# Patient Record
Sex: Male | Born: 1945 | Race: White | Hispanic: No | Marital: Married | Smoking: Never smoker
Health system: Southern US, Community
[De-identification: ages and names within clinical notes are randomized; demographics above are authoritative.]

## PROBLEM LIST (undated history)

## (undated) DIAGNOSIS — I219 Acute myocardial infarction, unspecified: Secondary | ICD-10-CM

## (undated) DIAGNOSIS — E782 Mixed hyperlipidemia: Secondary | ICD-10-CM

## (undated) DIAGNOSIS — R05 Cough: Secondary | ICD-10-CM

## (undated) DIAGNOSIS — R040 Epistaxis: Secondary | ICD-10-CM

## (undated) DIAGNOSIS — R079 Chest pain, unspecified: Secondary | ICD-10-CM

## (undated) DIAGNOSIS — I712 Thoracic aortic aneurysm, without rupture: Secondary | ICD-10-CM

## (undated) DIAGNOSIS — I10 Essential (primary) hypertension: Secondary | ICD-10-CM

## (undated) DIAGNOSIS — Z952 Presence of prosthetic heart valve: Secondary | ICD-10-CM

## (undated) DIAGNOSIS — R609 Edema, unspecified: Secondary | ICD-10-CM

## (undated) DIAGNOSIS — Z8679 Personal history of other diseases of the circulatory system: Secondary | ICD-10-CM

## (undated) DIAGNOSIS — G902 Horner's syndrome: Secondary | ICD-10-CM

## (undated) DIAGNOSIS — Z9889 Other specified postprocedural states: Secondary | ICD-10-CM

## (undated) DIAGNOSIS — I2510 Atherosclerotic heart disease of native coronary artery without angina pectoris: Secondary | ICD-10-CM

## (undated) DIAGNOSIS — I739 Peripheral vascular disease, unspecified: Secondary | ICD-10-CM

## (undated) DIAGNOSIS — I7101 Dissection of thoracic aorta: Secondary | ICD-10-CM

## (undated) DIAGNOSIS — I714 Abdominal aortic aneurysm, without rupture, unspecified: Secondary | ICD-10-CM

## (undated) DIAGNOSIS — G459 Transient cerebral ischemic attack, unspecified: Secondary | ICD-10-CM

## (undated) DIAGNOSIS — I639 Cerebral infarction, unspecified: Secondary | ICD-10-CM

## (undated) DIAGNOSIS — H9190 Unspecified hearing loss, unspecified ear: Secondary | ICD-10-CM

## (undated) DIAGNOSIS — I499 Cardiac arrhythmia, unspecified: Secondary | ICD-10-CM

## (undated) HISTORY — DX: Presence of prosthetic heart valve: Z95.2

## (undated) HISTORY — DX: Essential (primary) hypertension: I10

## (undated) HISTORY — DX: Chest pain, unspecified: R07.9

## (undated) HISTORY — DX: Abdominal aortic aneurysm, without rupture, unspecified: I71.40

## (undated) HISTORY — DX: Dissection of thoracic aorta: I71.01

## (undated) HISTORY — DX: Other specified postprocedural states: Z98.890

## (undated) HISTORY — DX: Transient cerebral ischemic attack, unspecified: G45.9

## (undated) HISTORY — DX: Horner's syndrome: G90.2

## (undated) HISTORY — PX: VASCULAR SURGERY: SHX849

## (undated) HISTORY — DX: Epistaxis: R04.0

## (undated) HISTORY — DX: Personal history of other diseases of the circulatory system: Z86.79

## (undated) HISTORY — DX: Abdominal aortic aneurysm, without rupture: I71.4

## (undated) HISTORY — PX: CARDIAC CATHETERIZATION: SHX172

## (undated) HISTORY — DX: Peripheral vascular disease, unspecified: I73.9

## (undated) HISTORY — DX: Thoracic aortic aneurysm, without rupture: I71.2

## (undated) HISTORY — DX: Mixed hyperlipidemia: E78.2

---

## 1997-03-18 DIAGNOSIS — I639 Cerebral infarction, unspecified: Secondary | ICD-10-CM

## 1997-03-18 DIAGNOSIS — I2510 Atherosclerotic heart disease of native coronary artery without angina pectoris: Secondary | ICD-10-CM | POA: Insufficient documentation

## 1997-03-18 HISTORY — DX: Cerebral infarction, unspecified: I63.9

## 1999-03-19 HISTORY — PX: CARDIAC VALVE REPLACEMENT: SHX585

## 2002-02-08 ENCOUNTER — Encounter: Payer: Self-pay | Admitting: *Deleted

## 2002-02-08 ENCOUNTER — Encounter: Payer: Self-pay | Admitting: Cardiology

## 2002-02-08 ENCOUNTER — Inpatient Hospital Stay (HOSPITAL_COMMUNITY): Admission: EM | Admit: 2002-02-08 | Discharge: 2002-02-17 | Payer: Self-pay | Admitting: Emergency Medicine

## 2002-02-09 ENCOUNTER — Encounter: Payer: Self-pay | Admitting: Thoracic Surgery (Cardiothoracic Vascular Surgery)

## 2002-02-09 ENCOUNTER — Encounter: Payer: Self-pay | Admitting: Internal Medicine

## 2002-02-09 DIAGNOSIS — I7101 Dissection of ascending aorta: Secondary | ICD-10-CM

## 2002-02-09 DIAGNOSIS — Z8679 Personal history of other diseases of the circulatory system: Secondary | ICD-10-CM

## 2002-02-09 HISTORY — DX: Dissection of thoracic aorta: I71.01

## 2002-02-09 HISTORY — DX: Dissection of ascending aorta: I71.010

## 2002-02-09 HISTORY — DX: Personal history of other diseases of the circulatory system: Z86.79

## 2002-02-10 ENCOUNTER — Encounter: Payer: Self-pay | Admitting: Thoracic Surgery (Cardiothoracic Vascular Surgery)

## 2002-02-10 ENCOUNTER — Encounter (INDEPENDENT_AMBULATORY_CARE_PROVIDER_SITE_OTHER): Payer: Self-pay | Admitting: Specialist

## 2002-02-11 ENCOUNTER — Encounter: Payer: Self-pay | Admitting: Thoracic Surgery (Cardiothoracic Vascular Surgery)

## 2002-02-12 ENCOUNTER — Encounter: Payer: Self-pay | Admitting: Thoracic Surgery (Cardiothoracic Vascular Surgery)

## 2002-02-13 ENCOUNTER — Encounter: Payer: Self-pay | Admitting: Cardiothoracic Surgery

## 2002-02-14 ENCOUNTER — Encounter: Payer: Self-pay | Admitting: Cardiothoracic Surgery

## 2002-03-01 ENCOUNTER — Encounter: Payer: Self-pay | Admitting: Thoracic Surgery (Cardiothoracic Vascular Surgery)

## 2002-03-01 ENCOUNTER — Encounter
Admission: RE | Admit: 2002-03-01 | Discharge: 2002-03-01 | Payer: Self-pay | Admitting: Thoracic Surgery (Cardiothoracic Vascular Surgery)

## 2002-03-02 ENCOUNTER — Encounter
Admission: RE | Admit: 2002-03-02 | Discharge: 2002-03-02 | Payer: Self-pay | Admitting: Thoracic Surgery (Cardiothoracic Vascular Surgery)

## 2002-03-02 ENCOUNTER — Encounter: Payer: Self-pay | Admitting: Thoracic Surgery (Cardiothoracic Vascular Surgery)

## 2002-03-18 DIAGNOSIS — I219 Acute myocardial infarction, unspecified: Secondary | ICD-10-CM

## 2002-03-18 HISTORY — DX: Acute myocardial infarction, unspecified: I21.9

## 2002-09-27 ENCOUNTER — Encounter: Payer: Self-pay | Admitting: Thoracic Surgery (Cardiothoracic Vascular Surgery)

## 2002-09-27 ENCOUNTER — Encounter
Admission: RE | Admit: 2002-09-27 | Discharge: 2002-09-27 | Payer: Self-pay | Admitting: Thoracic Surgery (Cardiothoracic Vascular Surgery)

## 2003-03-19 DIAGNOSIS — G4700 Insomnia, unspecified: Secondary | ICD-10-CM | POA: Insufficient documentation

## 2005-07-31 ENCOUNTER — Other Ambulatory Visit: Payer: Self-pay

## 2005-07-31 ENCOUNTER — Inpatient Hospital Stay: Payer: Self-pay | Admitting: Internal Medicine

## 2006-04-25 ENCOUNTER — Inpatient Hospital Stay (HOSPITAL_COMMUNITY): Admission: EM | Admit: 2006-04-25 | Discharge: 2006-04-30 | Payer: Self-pay | Admitting: Emergency Medicine

## 2006-04-25 ENCOUNTER — Ambulatory Visit: Payer: Self-pay | Admitting: Cardiology

## 2006-04-25 ENCOUNTER — Ambulatory Visit: Payer: Self-pay | Admitting: Cardiothoracic Surgery

## 2006-04-25 ENCOUNTER — Emergency Department: Payer: Self-pay | Admitting: Emergency Medicine

## 2006-04-25 DIAGNOSIS — I7101 Dissection of thoracic aorta: Secondary | ICD-10-CM

## 2006-04-25 DIAGNOSIS — I71012 Dissection of descending thoracic aorta: Secondary | ICD-10-CM

## 2006-04-25 HISTORY — DX: Dissection of descending thoracic aorta: I71.012

## 2006-04-25 HISTORY — DX: Dissection of thoracic aorta: I71.01

## 2006-04-28 ENCOUNTER — Encounter: Payer: Self-pay | Admitting: Cardiology

## 2006-05-08 ENCOUNTER — Ambulatory Visit: Payer: Self-pay | Admitting: Cardiology

## 2006-05-12 ENCOUNTER — Ambulatory Visit: Payer: Self-pay | Admitting: Thoracic Surgery (Cardiothoracic Vascular Surgery)

## 2006-05-12 ENCOUNTER — Encounter
Admission: RE | Admit: 2006-05-12 | Discharge: 2006-05-12 | Payer: Self-pay | Admitting: Thoracic Surgery (Cardiothoracic Vascular Surgery)

## 2006-06-05 ENCOUNTER — Ambulatory Visit: Payer: Self-pay | Admitting: Cardiology

## 2006-06-10 ENCOUNTER — Ambulatory Visit: Payer: Self-pay

## 2006-06-10 ENCOUNTER — Encounter: Payer: Self-pay | Admitting: Cardiology

## 2006-07-03 ENCOUNTER — Ambulatory Visit: Payer: Self-pay | Admitting: Cardiology

## 2007-01-17 ENCOUNTER — Emergency Department: Payer: Self-pay

## 2008-06-20 DIAGNOSIS — Z9889 Other specified postprocedural states: Secondary | ICD-10-CM | POA: Insufficient documentation

## 2008-07-31 ENCOUNTER — Ambulatory Visit: Payer: Self-pay | Admitting: Cardiovascular Disease

## 2008-07-31 ENCOUNTER — Inpatient Hospital Stay: Payer: Self-pay | Admitting: Internal Medicine

## 2008-08-11 DIAGNOSIS — I10 Essential (primary) hypertension: Secondary | ICD-10-CM | POA: Insufficient documentation

## 2008-08-11 DIAGNOSIS — E785 Hyperlipidemia, unspecified: Secondary | ICD-10-CM | POA: Insufficient documentation

## 2008-08-11 DIAGNOSIS — R079 Chest pain, unspecified: Secondary | ICD-10-CM | POA: Insufficient documentation

## 2008-08-18 ENCOUNTER — Ambulatory Visit: Payer: Self-pay | Admitting: Cardiology

## 2008-09-01 ENCOUNTER — Ambulatory Visit: Payer: Self-pay | Admitting: Cardiology

## 2008-10-04 LAB — CONVERTED CEMR LAB
CO2: 26 meq/L (ref 19–32)
Calcium: 9.8 mg/dL (ref 8.4–10.5)
Creatinine, Ser: 1.15 mg/dL (ref 0.40–1.50)
Glucose, Bld: 73 mg/dL (ref 70–99)
Sodium: 139 meq/L (ref 135–145)

## 2008-12-06 ENCOUNTER — Ambulatory Visit: Payer: Self-pay | Admitting: Cardiology

## 2010-04-08 ENCOUNTER — Encounter: Payer: Self-pay | Admitting: Thoracic Surgery (Cardiothoracic Vascular Surgery)

## 2010-07-31 NOTE — Assessment & Plan Note (Signed)
[facility] OFFICE NOTE   Jason Mcdaniel, Jason Mcdaniel                    MRN:          [id number]  DATE:12/06/2008                            DOB:          1945-04-21    Mr. Wieting comes in today for followup.   PROBLEM LIST:  1. History of aortic valve replacement at [facility] in 2000.  2. History of an ascending aortic root replacement in 2003.  3. History of type 3 aortic dissection, descending thoracic aorta,      treated medically.  4. Labile hypertension.  5. Hyperlipidemia.  6. 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   He has been doing remarkably well.  He works out almost on a daily  basis.  He is having no symptoms of coronary insufficiency or aortic  valve disease.  His blood pressure has been under great control since we  increased his Diovan/HCTZ to 320/25 daily.  Please see EMR for his other  medications.   His blood work is being followed by Dr. Sherrie Mustache.   PHYSICAL EXAMINATION:  VITAL SIGNS:  His blood pressure is 138/78 in the  left arm.  His pulse is 62 and regular, height 71 inches, and weight is  213.5 pounds.  BMI is 29.8.  HEENT:  Normal.  NECK:  Carotid upstrokes are equal bilaterally without bruits.  Thyroid  is not enlarged.  Trachea is midline.  LUNGS:  Clear to auscultation and percussion.  HEART:  Regular rate and rhythm, nondisplaced PMI.  There is no aortic  insufficiency heard.  ABDOMEN:  Soft, good bowel sounds.  No tenderness.  EXTREMITIES:  There are no cyanosis, clubbing, or edema.  Pulses are  brisk.   ASSESSMENT AND PLAN:  Mr. Beougher is doing well.  His aortic valve was  stable as well as his aortic root by 2-D echocardiography in June 2010.   I have made no changes in medical program.  We will see him back in a  year.     Thomas C. Daleen Squibb, MD, [facility]  Electronically Signed    TCW/MedQ  DD: 12/06/2008  DT: 12/07/2008  Job #:  [id number]   cc:   Mila Merry, MD

## 2010-07-31 NOTE — Assessment & Plan Note (Signed)
[facility] OFFICE NOTE   Jason Mcdaniel, Jason Mcdaniel                    MRN:          [id number]  DATE:08/18/2008                            DOB:          1945-03-27    Jason Mcdaniel comes in today for followup.   He has recently been having some problems with his blood pressure.  He  was actually admitted overnight at [facility].  A 2-D  echocardiogram was done, was read by Dr. Earney Hamburg of our group.  It turns out that he has a stable aortic valve and normal left  ventricular chamber size and overall function.  He also had normal wall  thickness which is interesting in the setting of a history of severe  hypertension.   His blood pressure has been running about 140-140 systolic over about 80  diastolic at home.  He has a history of aortic root replacement and  aortic valve surgery as outlined in the chart and then subsequent type 3  aortic dissection treated medically.   CURRENT MEDICATIONS:  1. Coumadin 10 mg per day.  2. Diovan 160/25 daily.  3. Catapres 0.2 mg b.i.d.  4. Pravastatin 80 mg daily.  5. Metoprolol 50 mg p.o. b.i.d.  6. Vitamin B12.  7. Coral calcium.   PHYSICAL EXAMINATION:  VITAL SIGNS:  Today, his blood pressure of 190/90  in the left arm.  His pulse is 53 and regular.  His EKG shows sinus  brady with no changes.  Weight is 213, down 6.  HEENT:  Normal.  NECK:  Carotid upstrokes are equal bilaterally without significant  bruit.  Thyroid is enlarged.  Trachea is midline.  LUNGS:  Clear to auscultation and percussion.  HEART:  A normal S1, mechanical S2.  No diastolic murmur.  No gallop.  ABDOMEN:  Soft, good bowel sounds.  There is no midline bruit or  tenderness.  There is no flank bruit.  EXTREMITIES:  No cyanosis, clubbing, or edema.  Pulses are present.  NEURO:  Intact.   ASSESSMENT:  Increasing blood pressure despite a fairly aggressive  medical program.   PLAN:  1.  Increase Diovan to 320/25 daily.  2. Continue other medications.  3. Blood pressure followup with [place] in the office in about a week.  He      will also monitor at      home.  Goal will be less than 140 over less than 85.  We will also      check a BMET at that time when he returns for a blood pressure      check.      Jason C. Daleen Squibb, MD, [facility]  Electronically Signed    TCW/MedQ  DD: 08/18/2008  DT: 08/19/2008  Job #: [id number]   cc:   Jason Mcdaniel

## 2010-08-03 NOTE — Assessment & Plan Note (Signed)
[facility] OFFICE NOTE   Jason, Mcdaniel                    MRN:          [id number]  DATE:06/05/2006                            DOB:          10-31-1945    Jason Mcdaniel comes in today because of some severe upper left-sided chest  tightness and back tightness this past Saturday morning.  He woke about  3 a.m.  He checked his blood pressures 201/102.  He took Catapres and it  came down quickly.  He has not had any recurrent symptoms, this includes  not having any exertional symptoms including working in the yard this  week.   His blood pressures more or less have been pretty good across the board.   This pain was not similar to his aortic dissection.   He had normal coronary arteries at catheterization in 2000 at [facility].  This was the time of his aortic valve replacement.   His blood pressure today is 142/70, pulse 68 and regular, weight 219, he  is in no acute distress.  Carotids are full and equal.  LUNGS:  Clear, there was no midscapular bruit.  HEART:  Reveals a nondisplaced PMI, has normal S1, has a prosthetic S2.  There is no diastolic component.  There is no rub.  ABDOMINAL:  Soft, good bowel sounds, extremities reveal good pulses in  his feet.   ASSESSMENT/PLAN:  Even though Jason Mcdaniel had normal coronary arteries  in 2000, it has been 8 years plus.  In addition he has aortic disease in  2 areas.  He could have some coronary disease as well.  I have asked him  not to exert himself too much over the next couple of days, over the  Easter holidays.  I have asked him to report to [facility] Monday or  Tuesday of next week for an adenosine Myoview.  I do not feel  comfortable exercising him at this point with his recent dissection.   If this is negative for ischemia I will see him back as scheduled.     Thomas C. Daleen Squibb, MD, [facility]  Electronically Signed    TCW/MedQ  DD: 06/05/2006  DT:  06/05/2006  Job #: [id number]   cc:   Jason Mcdaniel

## 2010-08-03 NOTE — Consult Note (Signed)
NAME:  Jason Mcdaniel, LAMPERT NO.:  [id number]   MEDICAL RECORD NO.:  [id number]          PATIENT TYPE:  INP   LOCATION:  2033                         FACILITY:  [facility]   PHYSICIAN:  Learta Codding, MD,FACC DATE OF BIRTH:  1945-10-13   DATE OF CONSULTATION:  04/27/2006  DATE OF DISCHARGE:                                 CONSULTATION   REFERRING PHYSICIAN:  Sheliah Plane, MD   REASON FOR CONSULTATION:  Re-establish followup for patient with type  III aortic dissection with cardiology.   HISTORY OF PRESENT ILLNESS:  The patient is a 65 year old male with a  history of severe vascular disease.  The patient has a history of aortic  valve replacement dating back to 2000 which was performed at [facility].  This was performed through right  thoracotomy.  The patient at that time had a heart catheterization done  with records available at [facility].  Reportedly, there was no significant  coronary artery disease, and no bypass grafting was performed.  Several  years later in 2003, the patient presented with acute ascending aortic  dissection, type 2, with replacement of the ascending aortic arch.  This  procedure was performed at [facility] by Dr. Cornelius Moras.   The patient states that, over the last 18 months, he did see Dr. Ladona Ridgel  in our practice for exertional syncope.  Unfortunately, I have not been  able to find any notes in the database, neither in the electronic chart  nor in the [place] [facility] folder or any current notes available.  The patient  has had no recent recurrent syncope.   The patient has now been admitted to [facility] after he  started developing severe abdominal pain as well as epigastric pain.  He  apparently got into an argument at the local [employer] in [place].  Of note is that the patient is [employer].  He  stated that he had severe epigastric pain with some radiation into the  chest, particularly  with radiation to the back along with nausea and  feeling extremely week and poorly.  The patient presented to [facility] and was found to have a type III aortic dissection.  The  patient was then transferred to [facility] under the care of  Dr. Tyrone Sage.  It is reported that the type III aortic dissection starts  just past the left subclavian artery and extending down in the abdominal  aorta.  Both kidneys are easily visualized with contrast.  There were no  electrocardiographic changes seen on the patient's EKG on admission.  The patient also ruled out with cardiac enzymes, but troponin, CK, and  CK-MB were negative.  His BNP level also was low at 70.   The patient also reports, over the last several months, no exertional  angina or exertional chest pain.  The current episode was rather an  acute one.  He states that he never had another catheterization  performed since the original procedure in 2000.  He also had no recent  stress testing done.  In addition, the  patient did report approximately  6 months ago two episodes, weeks apart, that involved right eye  deviation.  The patient was told that he potentially could have had a  small stroke.  This was associated with a decrease in his  anticoagulation level at the time.  MRI was performed as well as carotid  Doppler which, per patient's report, were within normal limits.   The patient is currently stable from a hemodynamic perspective.  Blood  pressure is controlled as per Dr. Tyrone Sage with the addition of  clonidine to his regimen of a beta blocker and Diovan.  The patient  reports no recurrent epigastric or substernal chest pain.   PAST MEDICAL HISTORY:  See Problem List below.   MEDICATIONS:  1. Coumadin as directed, 7.5.  2. Toprol, doses unknown.  3. Diovan, dose unknown.  4. Has previously taken Pravachol and Lipitor with associated muscle      aches.   SOCIAL HISTORY:  The patient lives in [place]  with his wife.  He is a  Advice worker.  He does not smoke, drinks occasionally.   FAMILY HISTORY:  The patient's mother died at age 31.  Father died at  age 7 of myocardial infarction.  Six brothers; one died at age 57 of  unknown causes.   REVIEW OF SYSTEMS:  As per HPI.  Currently no nausea or vomiting, no  fever or chills.  No melena, hematochezia.  No dysuria or frequency.  The patient reports leg cramps but no definite claudication.  No  syncope.  Remainder of Review of Systems negative.   PHYSICAL EXAMINATION:  VITAL SIGNS: Blood pressure 130/60, heart rate 70  beats per minute.  GENERAL: Well-nourished white male in no acute distress.  HEENT:  Pupils isochoric.  Conjunctivae clear.  NECK:  Supple.  Normal carotid upstroke and soft bilateral carotid  bruits probably radiating from the aortic valve.  No thyromegaly, no  JVD.  LUNGS: Clear breath sounds bilaterally.  HEART: Regular rate and rhythm with normal S1 and crisp closing click of  S2.  There is a soft murmur at the left upper sternal border.  ABDOMEN: Soft and nontender.  No rebound or guarding.  Good bowel  sounds.  EXTREMITIES:  Exam reveals no cyanosis, clubbing, or edema.  NEUROLOGIC: The patient is alert and oriented, grossly nonfocal.   LABORATORY WORK:  Troponin negative as outlined above.  Creatinine 0.88.  INR 3.3.  Hemoglobin 12.1. Sodium 139, potassium 4.1, chloride 104, CO2  28, glucose 120, calcium 8.8.   A 12-lead electrocardiogram revealed normal sinus rhythm, no acute  ischemic changes.   Chest x-ray performed at [facility]: No acute abnormalities.   CT scan as above.   PROBLEM LIST:  1. Type III dissection (acute).  2. Hypertension.  3. History of aortic valve replacement at [facility] (through a right thoracotomy).      a.     Status post acute descending aortic dissection, type II.     b.     Status post replacement of ascending arch, Dr. Cornelius Moras, 2003.  4.  History of syncope (evaluated by Dr. Ladona Ridgel).  5. Questionable history of transient ischemic attack x2, last one 6      months ago (status post negative neurologic evaluation).  6. Dyslipidemia.  7. Coumadin anticoagulation secondary to #3.   PLAN:  1. Acute care management per Dr. Tyrone Sage. Continue aggressive blood      pressure control.  Clonidine,  Diovan, and beta blockers seems like      a reasonable regimen.  2. Continue anticoagulation secondary to aortic valve replacement but      maintain INR between 2 and 3.  3. Dyslipidemia.  Will add Pravachol 40 mg p.o. nightly to patient's      medical regimen.  4. Particularly in light of the patient's aortic dissection, aspirin      can be discontinued.  The patient has no known history of coronary      artery disease, and Coumadin is acceptable therapy for prophylaxis.  5. The patient will need further evaluation with adenosine Cardiolite      study in the next couple of weeks, but this can be done as an      outpatient.  6. The patient will follow up in our new office in [place], and we      will make those arrangements.      Learta Codding, MD,FACC  Electronically Signed     GED/MEDQ  D:  04/27/2006  T:  04/27/2006  Job:  [id number]   cc:   Sheliah Plane, MD

## 2010-08-03 NOTE — Discharge Summary (Signed)
NAME:  Jason Mcdaniel, Jason Mcdaniel NO.:  [id number]   MEDICAL RECORD NO.:  [id number]                   PATIENT TYPE:  INP   LOCATION:  2006                                 FACILITY:  [facility]   PHYSICIAN:  Salvatore Decent. Cornelius Moras, M.D.              DATE OF BIRTH:  04/11/1945   DATE OF ADMISSION:  02/08/2002  DATE OF DISCHARGE:  02/17/2002                                 DISCHARGE SUMMARY   ADMISSION DIAGNOSIS:  Chest pain.   SECONDARY DIAGNOSIS:  1. Postoperative anemia secondary to blood loss.  2. Hypertension.  3. Coronary artery disease.   DISCHARGE DIAGNOSIS:  Status post aortic dissection repair.   HOSPITAL COURSE:  The patient was admitted to [facility] on  February 08, 2002, after he had been experiencing chest pain.  Because of  this, he was seen and evaluated by cardiologists.  The patient subsequent  had chest CAT scan performed which revealed the patient had an acute type 2  aortic dissection.  Because of this, Salvatore Decent. Cornelius Moras, M.D. was emergently  consulted and performed a redo median sternotomy with repair of acute aortic  dissection.  No complications were noted during this procedure.  Postoperatively, the patient had postoperative anemia secondary to blood  loss.  He required transfusion of one unit of packed red blood cells.  His  hematocrit was subsequently stable at 23.  Because his hematocrit remained  low, he was transfused one more unit of packed red blood cells and his  hemoglobin had risen sufficiently to 8.7 by postoperative day #4.  Because  the patient had a prior aortic valve repair and was on Coumadin therapy, his  Coumadin was restarted.  The remainder of the patient's postoperative course  remained uneventful and he was subsequently deemed stable for discharge home  on postoperative day #7, February 17, 2002.   DISCHARGE MEDICATIONS:  1. Protonix 40 mg one daily.  2. Altace 10 mg one tablet twice daily.  3. Atenolol 25 mg one  tablet twice daily.  4. Pravachol 80 mg one daily.  5. Coumadin 5 mg one daily as directed by Elliot Cousin, M.D.  6. Ultram 50 mg one tablet every four to six hours as needed for pain.   ACTIVITY:  The patient was told no driving or strenuous activity.  He was  told to walk daily and continue the use of his incentive spirometer  approximately 10 times every hour.   DIET:  Low fat, low salt.   WOUND CARE:  The patient was told he could shower and clean his incision  with soap and water.   DISPOSITION:  Home.   FOLLOW UP:  The patient was told to call his cardiologist for a two-week  follow-up appointment.  He was told to have his INR checked by Dr. Sherrie Mustache in  two to three days.  He was told to see Dr. Cornelius Moras at the [facility] office  on  Monday, December 15, at 1:30 p.m.     Levin Erp. Steward, P.A.                      Salvatore Decent. Cornelius Moras, M.D.    Henrene Hawking  D:  02/16/2002  T:  02/17/2002  Job:  [id number]

## 2010-08-03 NOTE — Consult Note (Signed)
NAME:  Jason Mcdaniel, Jason Mcdaniel NO.:  [id number]   MEDICAL RECORD NO.:  [id number]          PATIENT TYPE:  INP   LOCATION:  1845                         FACILITY:  [facility]   PHYSICIAN:  Sheliah Plane, MD    DATE OF BIRTH:  02-27-1946   DATE OF CONSULTATION:  DATE OF DISCHARGE:                                 CONSULTATION   CHIEF COMPLAINT:  The patient transferred from [facility] because  of chest pain.   HISTORY OF PRESENT ILLNESS:  The patient is a 65 year old male with an  extensive cardiac history, who after getting into a verbal argument with  a person at the local [facility] where he had been working out, he  developed severe substernal chest pain radiating to the back along with  nausea and feeling poorly.  He was taken to the [facility], where  a CT scan was performed and the patient was transferred to [facility].  He has a previous history of St. Jude aortic valve  replacement by Dr. Silvestre Mesi at [facility] in 2000, through a right thoracotomy  approach.  Subsequently, less than 3 years later he was found to have  had an acute aortic dissection and an ascending aorta dilated to over 6-  cm.  He was seen by Dr. Cornelius Moras at that time and replacement of the  ascending aorta to the underside of the arch with circulatory arrest was  carried out.  At the time, both reviewing the CT scan and from the  report, the patient did not have evidence of dissection involving the  descending thoracic aorta.  He notes that since the surgery, he has done  well without any other major medical problems and is treated actively  for hypertension and has been taking his Coumadin and having this  followed in [place] on a regular basis.   PAST MEDICAL HISTORY:  1. Status post bicuspid aortic valve, St. Jude aortic valve      replacement in September 2000; followed by ascending aortic      replacement in 2003.  2. History of exertional syncope, evaluated by Dr. Lewayne Bunting at      [facility] in July 2003.  3. History of hypertension.  4. History of hyperlipidemia.  5. History of stroke in 1997, treated at [facility].   MEDICATIONS:  The patient was unsure of the doses of his current  medications but he continues on:  1. Coumadin 7.5 mg a day.  2. Toprol, unknown dose.  3. Diovan, unknown dose.  4. A medicine for cholesterol but does not remember which one.  Notes      that he had previously been on Pravachol and has taken Lipitor      which caused sore muscles.  5. He also takes one baby aspirin a day.   SOCIAL HISTORY:  The patient lives in [place] with his wife.  He does  not use tobacco.  He drinks alcohol occasionally.   FAMILY HISTORY:  The patient's mother died at age 73.  Father died at  age 30 of myocardial infarction.  He has six brothers,  one died at age  12, unknown cause.   REVIEW OF SYSTEMS:  The patient denies any amaurosis or TIAs, denies any  fever, chills, or night sweats, denies any blood in his stool or urine,  other review of systems are negative.   PHYSICAL EXAMINATION:  GENERAL:  The patient is somewhat sedated from  medication given to him in [facility], but he is easily arousable  and answers questions.  The patient currently is comfortable without  complaint of pain.  VITAL SIGNS:  His blood pressure is 140/67, pulse is 65 and regular, O2  sat is 96% on 2 liters.  HEENT:  Pupils are slightly small but reactive.  NECK:  Without carotid bruits.  CARDIAC:  Reveals a crisp mechanical valve sound without murmur of  aortic insufficiency.  ABDOMEN:  Benign without palpable tenderness.  EXTREMITIES:  He has easily palpable femoral, popliteal, DP, and PT  pulses.  Radial pulses are easily palpable bilaterally.   The patient's old CT reports are reviewed.  I was able to locate the CT  scan from February 09, 2002.  At that time he had no dissection flap  involving the descending thoracic aorta.  I reviewed the CT scan  and  chest x-ray from [place] with Dr. Lacy Duverney.  This shows a type 3 aortic  dissection starting just past the left subclavian artery and extending  down into the abdominal aorta.  Both kidneys are easily visualized with  contrast.  EKG from [place] does not show any acute EKG changes.   IMPRESSION:  The patient with a previous history of mechanical aortic  valve replacement, subsequent aortic dissection and replacement of the  ascending aorta, now new type 3 aortic dissection.   PLAN:  1. Admit the patient.  2. Continue aggressive blood pressure control including beta-blockers.  3. Obtain serial EKGs and cardiac enzymes.  4. Continue with medical treatment.  5. We will need to maintain the patient's anticoagulation secondary to      his mechanical valve.      Sheliah Plane, MD  Electronically Signed     EG/MEDQ  D:  04/25/2006  T:  04/25/2006  Job:  [id number]

## 2010-08-03 NOTE — H&P (Signed)
NAME:  Jason Mcdaniel, Jason Mcdaniel NO.:  [id number]   MEDICAL RECORD NO.:  [id number]                   PATIENT TYPE:  EMS   LOCATION:  MAJO                                 FACILITY:  [facility]   PHYSICIAN:  Veneda Mcdaniel, M.D. [facility]               DATE OF BIRTH:  1945/06/07   DATE OF ADMISSION:  02/08/2002  DATE OF DISCHARGE:                                HISTORY & PHYSICAL   CHIEF COMPLAINT:  Chest pain.   HISTORY:  Jason Mcdaniel is a 65 year old white male with a history of aortic  valve replacement in September 2000, for bicuspid aortic valve with no known  coronary disease, who presents with acute onset of substernal chest  discomfort.  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  The patient had acute onset  of 9 out of 10 substernal depression chest pain.  This radiated to his jaws  and to both his ears, and was associated with nausea.  No frank shortness of  breath, syncope, or presyncope.  It has persisted and he was brought by EMS  to the emergency room.  The pain was finally relieved with IV nitroglycerin.  Currently, he is without discomfort.  The patient denies any prior  exertional chest pain or shortness of breath.  He was recently assessed by  Jason Mcdaniel in July for palpitations and syncope, and at that time an  echocardiogram showed normal LV function, peak and mean aortic valve  gradients were 60 and 30 mmHg, respectively.  A stress echo showed no  evidence of ischemia.  He has been treated conservatively since.   PAST MEDICAL HISTORY:  1. Notable for bicuspid aortic valve, status post St. Jude's aortic valve     replacement in September 2000, performed at [facility].  History of cardiac     catheterization at that time with no significant atherosclerotic disease.  2. History of exertional syncope.  3. Hypertension.  4.  Dyslipidemia.  5. History of stroke in 1997 treated at [facility].   ALLERGIES:  PENICILLIN which causes NAUSEA and VOMITING.   MEDICATIONS:  1. Coumadin 7.5 mg per day.  2. Atenolol.  3. Altace.  4. Pravachol 80 mg p.o. q.h.s.  5. Nitroglycerin as needed.  6. Coated aspirin 325 mg q.d.  7. Multivitamin.  8. Vitamin E q.d.   SOCIAL HISTORY:  The patient lives in [place] with his wife.  He is a  Location manager for [employer], who works third shift.  He has one son and one  daughter both alive and well.  He has never used tobacco products.  He uses  alcohol socially.   FAMILY HISTORY:  Mother died at age 24 of old age.  Father died at age of 81  of myocardial infarction.  He has six  brothers who are alive and well.  One  died at the age of 74 during sleep.  Two sisters, alive and well, one with  coronary disease.   REVIEW OF SYSTEMS:  Notable only for a mild cough in the morning and  episodes of exertional presyncope as previously noted.   PHYSICAL EXAMINATION:  GENERAL:  He is a well-developed, well-nourished  white male in no acute distress.  VITAL SIGNS:  Afebrile.  Pulse 54, blood pressure 115/79, respirations 18,  O2 saturations 97% on room air.  HEENT:  Pupils equal, round, and reactive to light.  Extraocular muscles are  intact.  Oropharynx has no lesion.  NECK:  Supple without carotid bruits.  No adenopathy.  CARDIAC:  Reveals regular rate with a systolic murmur at the base.  LUNGS:  Relatively clear to auscultation, soft, nontender.  EXTREMITIES:  No peripheral edema.  Peripheral pulses are 2 to 3+ and equal  bilaterally.  SKIN:  Shows no lesions.  NEURO:  Strength is 5/5, sensory is intact to touch.  Cranial nerves II-XII  are intact.  The patient is fully oriented.   Chest x-ray shows widening mediastinum suggesting disposition of the aorta.  The cardiac silhouette in size are normal.  There is no evidence of acute  infiltrations or effusions.  ECG shows sinus  rate of 53 with left axis  deviation, nonspecific ST-T wave changes, no new acute ischemic changes.   LABORATORY DATA:  Shows white count 5.2, hemoglobin 13.4, hematocrit 40.6,  platelets 178.  Sodium 136, potassium 4.7, chloride 105, bicarb 27, BUN 24,  creatinine 0.8, glucose 97.  PT 24.3, INR 2.6.  Admission CK is 257 and MB  of 5.0, troponin I is 0.02.   ASSESSMENT/PLAN:  Jason Mcdaniel is a 65 year old gentleman with history of  aortic valve replacement who presents with new onset of substernal chest  discomfort.  This is worrisome for unstable angina.  Other causes include  pulmonary embolus, although this is unlikely given that the patient is on  Coumadin.  Musculoskeletal discomfort is also a possibility, although the  patient denies any physical activity.  Gastroesophageal reflux disease is  also a possibility.  The patient will be placed on a proton pump inhibitor.  At this point, we will plan on admitting the patient to the hospital and  rule out acute myocardial infarction with serial enzymes.  We will obtain an  echo to assess his aortic valve and LV function.  I then discussed further  workup with the patient and his wife including medical therapy, stress  imaging studies, and cardiac catheterization.  I have recommended cardiac  catheterization to redefine his anatomy and to rule out critical coronary  artery disease as well as to reassess right heart pressures as well as  filling pressures to rule out problem with the valve such as stenosis.  The  patient and his wife are agreeable to cardiac catheterization and we will  arrange for this once the patient's INR has decreased to an acceptable  level.  We will bridge him with Lovenox or heparin as needed.                                                Veneda Mcdaniel, M.D. [facility]    NG/MEDQ  D:  02/08/2002  T:  02/08/2002  Job:  [id number]   cc:  Jason Mcdaniel  [facility]  Jason Mcdaniel, M.D. [facility]

## 2010-08-03 NOTE — Assessment & Plan Note (Signed)
[facility] OFFICE NOTE   Jason Mcdaniel                    MRN:          [id number]  DATE:05/08/2006                            DOB:          24-Jul-1945    Jason Mcdaniel returns today post hospitalization for a descending thoracic  aortic dissection.  He was admitted to Dr. Salina April service.  Please see the discharge summary for details.   We saw him in consultation.  He has a history of an aortic valve  replacement at [facility] in 2000.  Apparently, at  that time, he had cardiac catheterization that showed normal coronary  arteries.  He has also had an ascending aortic replacement in 2003.  This time he presents with a type III aortic dissection.   Apparently, his blood pressure has not been well controlled.  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  Lifts weights, or has, with his friends at the gym, and enjoys  deer hunting.   In the hospital his blood pressure was well controlled with titration of  his meds.  Since he has been home, his blood pressures have been good.  Unfortunately, his blood pressure cuff is not calibrating with ours  today and, in fact, measures higher.  It is probably too small.   He offers no complaints today, except he had a severe episode of  constipation that finally responded to some magnesium citrate.  This was  probably from oxycodone.  In addition, he has some occasional stomach  ache after he eats.  He has had no melena or hematochezia.   MEDICATIONS:  1. Coumadin 10 mg a day.  2. Diovan 160/25 daily.  3. Toprol XL 100 mg a day.  4. Pravachol 40 mg a day.  5. Catapres 0.2 mg p.o. b.i.d.  6. Multivitamin daily.  7. Calcium daily.   He has had a lot of trouble sleeping.   EXAM:  He is in no acute distress.  SKIN:  Warm and dry.  Blood pressure is 138/72, pulse 55 and regular.  EKG  shows sinus brady.  Weight is 219.  HEENT:  Normocephalic, atraumatic.  PERRLA.  Extraocular muscles are  intact.  Sclerae clear.  Facial symmetry is normal.  NECK:  Supple.  Carotids are full without bruits.  There is no JVD.  Thyroid is not enlarged.  LUNGS:  Clear.  HEART:  Nondisplaced PMI.  There is no aortic insufficiency murmur.  He  has a soft systolic murmur with an aortic valve click.  ABDOMEN:  Soft with good bowel sounds.  There is no epigastric  tenderness.  There is no organomegaly.  EXTREMITIES:  No cyanosis, clubbing, or edema.  Pulses are symmetrical.  NEURO:  Exam is intact.   His electrocardiogram shows sinus brady.  Otherwise, normal.   ASSESSMENT AND PLAN:  Jason Mcdaniel is doing well.  We have asked him to  get a larger blood pressure cuff and to keep an eye on his blood  pressure.  He has his Coumadin checked  by Dr. Sherrie Mustache.  I will give him a  short course of temazepam 30 mg nightly.  I have warned him not to get  too used to this.  Hopefully, he will transition soon.   Specifically, we talked about exercise.  I have asked him to continue  walking without any lifting for the next 2 weeks.  I have told him to  avoid isometric lifting in the future all together.  Isotonic is okay  within reason.  Brisk walking 3 hours a week would be helpful.   I think it is reasonable for him to re-run for [employer] as  long as his stress levels are maintained, and he continues to practice a  healthy lifestyle.  His wife, I, and he had a good understanding of that  when he left today.   I will plan on seeing him back in 6 weeks to make sure his pressures are  good.     Thomas C. Daleen Squibb, MD, [facility]  Electronically Signed    TCW/MedQ  DD: 05/08/2006  DT: 05/08/2006  Job #: [id number]   cc:   Mila Merry

## 2010-08-03 NOTE — Assessment & Plan Note (Signed)
[facility]                                 ON-CALL NOTE   RODDIE, RIEGLER                    MRN:          [id number]  DATE:05/03/2006                            DOB:          [birth year]    Received a page through the answering from Fae Pippin concerning  Jason Mcdaniel, a patient of Dr. Imagene Gurney.  Date of birth October 26, 1945.  I returned the call at [phone] and spoke with Ms. Ferrall.  She  states her husband is having a lot of discomfort and pain.  Patient was  discharged home on the 13th status post type 3 aortic dissection.  Patient was under the care of [facility] in that hospitalization.  Apparently,  patient has had significant constipation since he was discharged home.  He was also seen by Dr. Andee Lineman during this hospitalization, so I went  ahead and spoke with Ms. Dayhoff.  She states her husband has been very  constipated, apparently received a large amount of narcotics during  hospitalization in the setting of already known constipation dependent  on Metamucil daily at home.  She states her husband is very bloated and  uncomfortable.  Bowels have not moved since Wednesday before he was  discharged home.  She states they have tried Fleets enemas, Milk of  Magnesia, stool softeners, oral laxatives.  Patient has not had any  results.  They have also tried MiraLax.  I instructed her to try 1/2  bottle of magnesium citrate.  If patient tolerates that without any  nausea or vomiting, and no results from that, he can take the other 1/2  of the bottle.  If he has no results today, he needs to come to the  emergency and get evaluated for possible ileus, or if his symptoms  worsen before that time.  Ms. Quintero understands the importance of  following up, and agrees to do so.      [name], ACNP  Electronically Signed      [name]. Ladona Ridgel, MD  Electronically Signed   MB/MedQ  DD: 05/03/2006  DT: 05/03/2006  Job #: [id number]

## 2010-08-03 NOTE — Discharge Summary (Signed)
NAME:  Jason Mcdaniel, Jason Mcdaniel NO.:  [id number]   MEDICAL RECORD NO.:  [id number]          PATIENT TYPE:  INP   LOCATION:  2033                         FACILITY:  [facility]   PHYSICIAN:  Sheliah Plane, MD    DATE OF BIRTH:  November 18, 1945   DATE OF ADMISSION:  04/25/2006  DATE OF DISCHARGE:  04/30/2006                               DISCHARGE SUMMARY   PRIMARY ADMITTING DIAGNOSIS:  Chest pain.   ADDITIONAL/DISCHARGE DIAGNOSES:  1. Type 3 aortic dissection.  2. Poorly controlled hypertension.  3. History of aortic valve replacement in 2000.  4. History of ascending aortic replacement in 2003.  5. Hyperlipidemia.  6. History of cerebrovascular accident.  7. Questionable history of recent transient ischemic attack.  8. History of syncope, evaluated previously by Dr. Ladona Mcdaniel.   HISTORY:  The patient is a 65 year old male with an extensive coronary  history.  He will well known to [facility].  He apparently has underwent a  previous aortic valve replacement with a St. Jude valve by Dr. Gasper Mcdaniel at [facility] in 2000 via a right thoracotomy approach.  Approximately  3 years later he was found to have an acute aortic dissection and  underwent replacement of the ascending aorta by Dr. George Mcdaniel.  He  has been maintained on Coumadin since that time and has been treated  with blood pressure management.  He recently experienced severe  substernal chest pain, radiating to his back, associated with nausea  following an argument with someone at the gym where he has been working  out.  He was taken to [facility] and a CT scan was performed,  which showed type 3 aortic dissection that started just past the left  subclavian artery and extended down into the abdominal aorta.  His  kidneys are easily visualized with contrast.  This is new from his  previous CT scan in 2003.  Because of these findings, he was transferred  to [facility] under the cardiothoracic surgery service for  further evaluation and treatment.   HOSPITAL COURSE:  The patient was admitted by Dr. Sheliah Plane and  found to have some elevated blood pressures.  However, this and his CT  findings, a cardiology consultation was obtained for establishment of  the patient with the cardiologist.  He was initially seen by Dr. Andee Mcdaniel  and also followed by Dr. Silvana Mcdaniel and his blood pressure was treated  aggressively during his admission.  He has remained stable throughout  his hospital course.  He has been pain free since admission.  His  aspirin has been discontinued and he has been on continued on Coumadin,  which he had been on prior to admission.  A 2D echocardiogram was  performed, which showed elevated left ventricular systolic function was  normal with mild left ventricular wall motion abnormalities.  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  Left atrial size was at upper limits with murmur.  There is mild  dilation of the pulmonary artery and the estimated peak pulmonary artery  systolic pressure was moderately increased.  This  was really the most  significant change since 2003.  Since he has remained stable and doing  well, his blood pressure is well controlled and his iron is therapeutic  now at 2.0, he is stable to be discharged home.  The remainder of his  labs have been stable with a sodium of 139, potassium 4.1, BUN 7,  creatinine 0.88, white count is 7.5, hemoglobin 12.1, hematocrit 34.6,  platelet 1253.  I guess his INR is therapeutic today at 2.0 with a PT of  24.  He he is afebrile and his vital signs are stable.  His blood  pressure is well controlled.  He is maintaining normal sinus rhythm and  is, otherwise, within normal limits on physical exam.  We will have Dr.  Daleen Mcdaniel review his medications prior to discharge and he will be able to be  discharged home later today, April 30, 2006.   DISCHARGE MEDICATIONS:  1. Coumadin 10 mg daily until  blood work is checked.  2. Diovan 160/25 mg daily.  3. Toprol-XL 100 mg daily.  4. Pravachol 40 mg daily.  5. Catapres 0.2 mg b.i.d.  6. Tylox 1-2 q.4 hours p.r.n. for pain.   DISCHARGE INSTRUCTIONS:  He is asked to refrain from strenuous activity.  He may continue a low-fat, low-sodium diet.   DISCHARGE FOLLOWUP:  He will return to the [facility] in 2-3 weeks for a  follow up with Dr. Tyrone Mcdaniel or Dr. Cornelius Mcdaniel.  He will follow up with Dr.  Daleen Mcdaniel on March 3 at 11 a.m. and will be scheduled at that time for an  outpatient Myoview study.  He will also have follow up with his PT and  INR on May 02, 2006 by Dr. Theodis Mcdaniel office.  In the interim, if he  experiences any problems or has questions, he is asked to contact our  office.      Jason Mcdaniel, P.A.      Sheliah Plane, MD  Electronically Signed    GC/MEDQ  D:  04/30/2006  T:  05/01/2006  Job:  [id number]   cc:   Thomas C. Jason Squibb, MD, [facility]  Mila Merry

## 2010-08-03 NOTE — Op Note (Signed)
NAME:  DWAN, HEMMELGARN NO.:  [id number]   MEDICAL RECORD NO.:  [id number]                   PATIENT TYPE:  INP   LOCATION:  2309                                 FACILITY:  [facility]   PHYSICIAN:  Salvatore Decent. Cornelius Moras, M.D.              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   POSTOPERATIVE DIAGNOSIS:  Acute aortic dissection (Stanford classification  type A, DeBakey classification type II).   PROCEDURE:  Emergency redo median sternotomy for extracorporal circulation,  profound hypothermic total circulatory arrest, resection and grafting of  ascending aorta for repair of acute aortic dissection.   SURGEON:  Salvatore Decent. Cornelius Moras, M.D.   ASSISTANT:  Salvatore Decent. Dorris Fetch, M.D.   ANESTHESIA:  General.   BRIEF CLINICAL NOTE:  The patient is a 65 year old male with history of  aortic stenosis and hypertension, who underwent right anterior thoracotomy  for port access, aortic valve replacement with a St. Jude mechanical aortic  valve prosthesis by Dr. Judd Gaudier at [facility] in  September of 2000.  The patient initially did well.  He subsequently  developed intermittent episodes of severe chest pain which have waxed and  waned.  On the day prior to surgery the patient developed severe onset  crushing substernal chest pain which he described as the worst pain of his  life.  The patient was brought to the emergency room at [facility] and initially evaluated and admitted by Dr. Veneda Melter.  The  patient ruled out for acute myocardial infarction.  His chest pain persisted  despite aggressive medical treatment of severe hypertension and intravenous  nitrates.  Chest CT scan was performed on February 09, 2002.  This  demonstrates acute aortic dissection involving the proximal ascending  thoracic aorta.  The dissection appears to stop at the level of the aortic  arch, and the descending thoracic aorta appears normal.  There are no  pleural effusions.  There is no pericardial effusion.  There is an obvious  flap in the proximal ascending aorta as well as a large intramural hematoma.  Emergency cardiac surgical consultation has been requested.   The patient and his wife are counseled at length regarding the emergency  nature of this clinical problem and the need for prompt surgical  intervention.  Long-term prognosis with medical therapy is discussed.  The  high risk nature of surgical intervention is reviewed at length including  the possible need for profound hypothermic total circulatory arrest.  The  patient and his wife understand and accept all associated risks of surgery,  including but not limited to risk of death, stroke, myocardial infarction,  respiratory failure, renal failure, ischemic injury to one or both lower  extremities  and/or bowel, bleeding requiring blood transfusion, arrhythmia,  need for replacement of the patient's existing aortic valve or possible need  for coronary artery bypass graft.  All of their questions have been  addressed.   OPERATIVE NOTE IN DETAIL:  The patient is brought to the operating room on  an emergent basis on the afternoon of February 09, 2002.  The patient is  identified as Mr. Tymir Terral and placed on the operating room table in  the supine position.  General endotracheal anesthesia is induced  uneventfully under the care and direction of Dr. Katrinka Blazing.  Central monitoring  is established by Dr. Katrinka Blazing, including large bore central venous catheters  placed in both the right and left internal jugular veins.  A Swans-Ganz  catheter is placed through the right internal jugular approach.  The left  radial arterial line is placed.  A Foley catheter is placed.   Intravenous  antibiotics are administered.   Baseline transesophageal echocardiogram is performed.  This confirms the  presence of an acute aortic dissection with an obvious flap in the proximal  ascending thoracic aorta.  The patient's existing mechanical aortic valve  prosthesis can be visualized and appears to be functioning normally.  The  aortic dissection appears to stop just above the level of the left main  coronary artery.  The transverse arch is not well visualized.  The left  ventricle appears moderately hypertrophied.  Overall left ventricular  systolic function appears fairly good.  There is mild to moderate mitral  regurgitation.  No other abnormalities are noted.   The patient's chest, abdomen, both groins, and both lower extremities are  prepared and draped in the sterile manner.  A small oblique incision is made  in the left groin and the left common femoral artery is exposed.  Vessel  loops are placed around this vessel both proximally and distally.   A median sternotomy incision is performed.  There are severe adhesions from  the patient's previous aortic valve surgery.  Initially dissection is  continued along the right atrium and right ventricular outflow tract surface  to facilitate mobilization of the right atrium for subsequent venous  cannulation.  Care is taken to avoid the area surrounding the ascending  thoracic aorta initially.  The patient is heparinized systemically.  The  patient is maintained on a continuous infusion of Aprotinin.  Appropriate  modulation of heparin dosing is performed based on Aprotinin therapy.  A two-  stage venous cannula is placed into the right atrium.  Retrograde  cardioplegia catheter is placed through the right atrium into the coronary  sinus.  Dissection is continued more proximally and the structures  surrounding the ascending thoracic aorta in the heart are exposed.  There are a lot of adhesions between the anterolateral  surface of the aortic wall  on the right side and the right atrium, which make dissection difficult.  The ascending thoracic aorta is obviously acutely dissected with hematoma  easily visible throughout the wall.  Dissection is continued.  Adequate  heparinization is verified.   Cardiopulmonary bypass is begun and further sharp dissection is performed to  mobilize the structures surrounding the ascending thoracic aorta.  The  superior vena cava is also identified and dissected from associated  structures.  A temperature probe is placed in the left ventricular septum.  The patient is cooled to 18 degrees systemic temperature.  The aorta  crossclamp is applied and cardioplegia is delivered initially in an  antegrade fashion through the  aortic root using a 16-gauge angiocatheter  placed directly into the ascending thoracic aorta.  The initial cardioplegia  thoracic and myocardial cooling are notably excellent.  Supplemental  cardioplegia has been administered retrograde through the coronary sinus  catheter.  Repeat doses of cardioplegia are administered throughout the  crossclamp portion of the operation retrograde through the coronary sinus  catheter to maintain septal temperature below 15 degrees centigrade  temperature.  It should be noted that arterial cannulation is completed to  the left common femoral artery using a 22-French straight Harvey femoral  artery cannula.   The ascending aorta is transected.  There is a large amount of thrombus  within the wall of the ascending thoracic aorta.  There is also a true and  false lumen with an obvious flap in the middle of the aorta.  The primary  tear in the aortic wall can be appreciated on the posterior and left  posterolateral surface of the aorta, approximately 2.5 cm above the level of  the left main coronary artery.  This is approximately 1 cm above the level  of the previous aortotomy from the patient's original surgery.  The  hematoma  and dissection extend throughout the entire ascending thoracic aorta and  involve the aorta past the level of the takeoff of the innominate artery.   A right-angled 22-French metal tipped cannula is placed directly in the  superior vena cava.  An umbilical loop is placed around the vena cava as  well.  The patient has cooled to core systemic temperature of 18 degrees  centigrade.  The head is packed in ice.  High-dose barbiturate anesthesia,  steroids, and Mannitol are administered intravenously.  The patient is  placed in the steep Trendelenburg position.  Once core temperature reaches  18 degrees centigrade, total circulatory arrest is commenced.   The patient's blood volume is exsanguinated into the pump, and the bypass  machine is turned off.  The aortic crossclamp is removed.  One can  appreciate the distal pair extending into the arch.  The aorta is trimmed  back until all of the dissected tissue is removed.  At this point the layers of the aortic wall are reapproximated using Bio-glue from the [employer].  This is performed to manufacturer recommendations and a dry  field is maintained during application of the glue to the aortic wall.  After completion of this, retrograde cerebral perfusion is commenced through  the superior vena cava cannula while obstructing the superior vena cava  proximal to this level.  Retrograde perfusion is performed at 250 cc per  minute.   A 30-mm Hemashield straight graft with a 10-mm side arm is chosen for graft  replacement of the ascending aorta.  The proximal end of this is trimmed and  beveled to an appropriate shape and size.  The distal anastomosis is then  performed using a running 3-0 Prolene suture with a Teflon felt pledget  strip to buttress the distal suture line.  This is beveled underneath the  arch at the level of the takeoff of the innominate artery and the transverse  arch.  After completion of the proximal  anastomosis, blood volume is slowly  reinstituted through the femoral artery cannula.  A crossclamp is placed on  the prosthetic graft.  Cardiopulmonary bypass is reinstituted after a total  circulatory arrest time of _______.  Rewarming is slowly begun to 28 degrees  centigrade temperature.  The suture line is carefully inspected for  hemostasis.  The proximal end of the aortic graft is now trimmed to an  appropriate length.  The proximal end of the ascending thoracic aorta is  trimmed back to the level immediately above the patient's previous  aortotomy.  The dissection stops at this level and does not extend down into  the left main or coronary artery.  Each coronary artery vessels are easily  visualized.  The patient's existing St. Jude aortic prosthesis is visualized  and appears to be well seated with no abnormalities.  The layers of the  proximal aorta are now reapproximated with BioGlue.  The proximal  anastomosis is performed in a similar fashion using running 3-0 Prolene  suture with a Teflon felt stripped buttress reanastomosis.  After completion  of the proximal anastomosis, one final dose of warm retrograde hot shot  cardioplegia is administered.  The lungs are ventilated and the heart  allowed to fill to remove any residual air from the pulmonary veins and left  ventricle or aortic root.  The aortic root is vented through the side arm in  the aortic graft.  The aortic crossclamp is removed after a total crossclamp  time of 129 minutes.   Rewarming is continued to systemic temperature.  At this point the arterial  cannula is switched from the femoral artery to the side arm of the aortic  graft.  After this is completed, the femoral artery cannula is removed and  the left common femoral artery repaired using running 5-0 Prolene suture.  Circulation is then reestablished to the left lower extremity after a total  ischemic time to the lower extremity of 188 minutes.  Rewarming  is continued until the patient's core temperature reaches 37.2  degrees centigrade temperature.  The heart begins to beat spontaneously  without need for cardioversion.  Epicardial pacing wires are affixed the to  right ventricular outflow tract into the right atrial appendage.  The  retrograde cardioplegia catheter is removed as is the right-angled metal  tipped cannula in the superior vena cava.  Hemostasis is ascertained.  Several interrupted pledgeted sutures are needed to buttress the proximal  aortic suture line repair along the right anterolateral portion of the  anastomosis.  A Magoon needle is placed in the ascending thoracic aortic  graft to function as a root vent.   The patient is rewarmed to greater than 37.2 degrees systemic temperature.  The patient had been weaned from cardiopulmonary bypass without difficulty.  The patient's rhythm at separation from bypass is a junctional rhythm.  A-V  sequential pacing is employed.  Normal sinus rhythm resumes spontaneously  shortly thereafter, and the atrial pacing is employed to increase the heart  rate.  No inotropic support is required.  Total cardiopulmonary bypass time  for the operation is 238 minutes.  Followup transesophageal echocardiogram  performed by Dr. Katrinka Blazing after separation from bypass demonstrates preserved  left ventricular function with a normal-appearing aortic valve prosthesis.  The mitral regurgitation remains mild and is essentially unchanged.  There  is no residual air in the left heart circulation.   The venous cannula is removed uneventfully.  Protamine is administered to  reverse the anticoagulation.  The side arm of the aortic graft is doubly  ligated and oversewn.  The patient is transfused one 10-pack of adult  platelets and 4 units of fresh frozen plasma due to thrombocytopenia as well  as coagulopathy with known Coumadin therapy prior to surgery.   The mediastinum and both left and right pleural spaces  are irrigated with  saline solution containing vancomycin.  Meticulous surgical hemostasis is  ascertained.  There is moderate coagulopathy which appears to improve with  administration of blood products as described.  The mediastinum and the left  chest are now drained with four chest tubes placed through separate stab  incisions inferiorly.  The median sternotomy is closed in routine fashion.  A 16-gauge femoral artery cannula is placed directly in the left femoral  artery for monitoring purposes due to discrepancy between the aortic root  pressure and the radial artery pressure.  The left groin incision is then  irrigated with saline solution and inspected for hemostasis.  The incision  is closed in multiple layers in routine fashion.  All skin incisions are  closed with subcuticular skin closures.   The patient tolerated the procedure well and is transported to the surgical  intensive care unit in stable condition.  There are no intraoperative complications.  All sponge, instrument, and needle counts are verified  correct at the completion of the operation.  The patient was transfused a  total of 4 units of fresh frozen plasma and two 10-packs of adult platelets.  No red blood cells were required.                                               Salvatore Decent. Cornelius Moras, M.D.    CHO/MEDQ  D:  02/09/2002  T:  2020-10-401  Job:  [id number]   cc:   [name]. Ladona Ridgel, M.D. [facility]  [address]  [place]  [place] [address]  Fax: 1   Noel Christmas, M.D.    [facility]  [place]

## 2010-08-03 NOTE — Assessment & Plan Note (Signed)
[facility] OFFICE NOTE   Jason Mcdaniel, Jason Mcdaniel                    MRN:          [id number]  DATE:07/03/2006                            DOB:          06-Jun-1945    Mr. Jason Mcdaniel returns today for followup of his chest discomfort and his  hypertension.   His blood pressure has been under great control.  Today, it is 124/68,  pulse 62 and regular.   His stress Myoview showed an EF of 64% with no ischemia or scar.  He has  normal contractility and thickening of all areas of the myocardium.  This was dated June 10, 2006.   He has no complaints today.  He is back into exercising on a regular  basis.   MEDICATIONS:  Unchanged.   We have renewed his Pravachol and his Catapres.  He will continue with  his Coumadin, diovan, Toprol XL.  We will plan on seeing him back in 6  months.     Thomas C. Daleen Squibb, MD, [facility]  Electronically Signed    TCW/MedQ  DD: 07/03/2006  DT: 07/03/2006  Job #: [id number]   cc:   Mila Merry

## 2010-08-30 ENCOUNTER — Encounter: Payer: Self-pay | Admitting: Cardiovascular Disease

## 2010-12-21 DIAGNOSIS — R55 Syncope and collapse: Secondary | ICD-10-CM | POA: Insufficient documentation

## 2010-12-24 ENCOUNTER — Ambulatory Visit: Payer: Self-pay | Admitting: Family Medicine

## 2011-01-25 ENCOUNTER — Other Ambulatory Visit: Payer: Self-pay | Admitting: Cardiology

## 2011-02-22 ENCOUNTER — Other Ambulatory Visit: Payer: Self-pay | Admitting: Cardiology

## 2011-03-06 ENCOUNTER — Telehealth: Payer: Self-pay | Admitting: *Deleted

## 2011-03-06 ENCOUNTER — Ambulatory Visit (INDEPENDENT_AMBULATORY_CARE_PROVIDER_SITE_OTHER): Payer: PRIVATE HEALTH INSURANCE | Admitting: Cardiology

## 2011-03-06 ENCOUNTER — Encounter: Payer: Self-pay | Admitting: Cardiology

## 2011-03-06 VITALS — BP 114/62 | HR 51 | Resp 18 | Ht 70.0 in | Wt 223.0 lb

## 2011-03-06 DIAGNOSIS — I7101 Dissection of thoracic aorta: Secondary | ICD-10-CM

## 2011-03-06 DIAGNOSIS — I498 Other specified cardiac arrhythmias: Secondary | ICD-10-CM

## 2011-03-06 DIAGNOSIS — I359 Nonrheumatic aortic valve disorder, unspecified: Secondary | ICD-10-CM

## 2011-03-06 DIAGNOSIS — I4510 Unspecified right bundle-branch block: Secondary | ICD-10-CM

## 2011-03-06 DIAGNOSIS — R001 Bradycardia, unspecified: Secondary | ICD-10-CM

## 2011-03-06 DIAGNOSIS — I2510 Atherosclerotic heart disease of native coronary artery without angina pectoris: Secondary | ICD-10-CM

## 2011-03-06 DIAGNOSIS — Z9889 Other specified postprocedural states: Secondary | ICD-10-CM

## 2011-03-06 DIAGNOSIS — I71012 Dissection of descending thoracic aorta: Secondary | ICD-10-CM

## 2011-03-06 DIAGNOSIS — Z952 Presence of prosthetic heart valve: Secondary | ICD-10-CM

## 2011-03-06 DIAGNOSIS — Z8679 Personal history of other diseases of the circulatory system: Secondary | ICD-10-CM

## 2011-03-06 MED ORDER — METOPROLOL TARTRATE 50 MG PO TABS: 25.0000 mg | ORAL_TABLET | Freq: Two times a day (BID) | ORAL | Status: DC

## 2011-03-06 NOTE — Telephone Encounter (Signed)
I spoke with Jason Mcdaniel about echocardiogram ordered by Dr. Daleen Squibb to assess valve function and left atrium. Jason Mcdaniel agrees and echo will be done 03/07/11 at 10:30am. Message left on [facility] voicemail regarding echo. Mylo Red RN

## 2011-03-06 NOTE — Progress Notes (Signed)
HPI Jason Mcdaniel returns for followup of his history of thoracic aortic aneurysm repair, type III descending aortic dissection, and history of aortic valve replacement. His history of hypertension which is been well controlled.  Describes symptoms that have been attributed to TIAs. He gets a headache on the right side of his head, his right eye gaze his left and his left eye is focused rate. He is dizzy. Dr. Sherrie Mustache in neurology consultation and made some evaluation and recommendations. I do not have the records.  He denies any symptoms of palpitations, chest pain, shortness of breath, orthopnea, or PND, presyncope or syncope. He doesn't have exertional chest discomfort.  His heart rate runs in the high 40s when he checks it. His blood pressures been a little low for him.  Past Medical History  Diagnosis Date  . Hyperlipidemia, mixed   . Hypertension     unspecified  . Chest pain, unspecified   . Peripheral vascular disease     Current Outpatient Prescriptions  Medication Sig Dispense Refill  . aspirin 81 MG tablet Take 81 mg by mouth daily.        . cloNIDine (CATAPRES) 0.2 MG tablet Take 0.2 mg by mouth 2 (two) times daily.        . metoprolol (LOPRESSOR) 50 MG tablet Take 0.5 tablets (25 mg total) by mouth 2 (two) times daily.      . pravastatin (PRAVACHOL) 80 MG tablet Take 40 mg by mouth 2 (two) times daily.       . valsartan-hydrochlorothiazide (DIOVAN-HCT) 320-25 MG per tablet TAKE 1 TABLET EVERY EVENING  30 tablet  0  . warfarin (COUMADIN) 5 MG tablet 10 mg. Use as directed by Anticoagulation Clinic        No Known Allergies  Family History  Problem Relation Age of Onset  . Coronary artery disease      History   Social History  . Marital Status: Single    Spouse Name: N/A    Number of Children: N/A  . Years of Education: N/A   Occupational History  . Full Time    Social History Main Topics  . Smoking status: Never Smoker   . Smokeless tobacco: Not on file  .  Alcohol Use: Yes  . Drug Use: Not on file  . Sexually Active: Not on file   Other Topics Concern  . Not on file   Social History Narrative  . No narrative on file    ROS ALL NEGATIVE EXCEPT THOSE NOTED IN HPI  PE  General Appearance: well developed, well nourished in no acute distress HEENT: symmetrical face, PERRLA, good dentition  Neck: no JVD, thyromegaly, or adenopathy, trachea midline Chest: symmetric without deformity Cardiac: PMI non-displaced, RRR, normal S1, S2, no gallop, Soft systolic murmur, no diastolic component. Lung: clear to ausculation and percussion Vascular: all pulses full without bruits, no carotid bruits  Abdominal: nondistended, nontender, good bowel sounds, no HSM, no bruits Extremities: no cyanosis, clubbing or edema, no sign of DVT, no varicosities  Skin: normal color, no rashes Neuro: alert and oriented x 3, non-focal Pysch: normal affect  EKG Sinus bradycardia right bundle branch block BMET    Component Value Date/Time   NA 139 09/01/2008 2121   K 4.6 09/01/2008 2121   CL 102 09/01/2008 2121   CO2 26 09/01/2008 2121   GLUCOSE 73 09/01/2008 2121   BUN 21 09/01/2008 2121   CREATININE 1.15 09/01/2008 2121   CALCIUM 9.8 09/01/2008 2121    Lipid  Panel  No results found for this basename: chol, trig, hdl, cholhdl, vldl, ldlcalc    CBC No results found for this basename: wbc, rbc, hgb, hct, plt, mcv, mch, mchc, rdw, neutrabs, lymphsabs, monoabs, eosabs, basosabs

## 2011-03-06 NOTE — Assessment & Plan Note (Signed)
We'll decrease metoprolol to 25 mg twice a day.

## 2011-03-06 NOTE — Assessment & Plan Note (Signed)
Valve sounds good but with his history of so called TIAs will obtain Echo to assess valve and left atrium.

## 2011-03-06 NOTE — Assessment & Plan Note (Signed)
Stable with good blood pressure control.

## 2011-03-06 NOTE — Patient Instructions (Addendum)
Your physician has recommended you make the following change in your medication:  Decrease Lopressor  Your physician wants you to follow-up in: 1year with Dr. Dorinda Hill will receive a reminder letter in the mail two months in advance. If you don't receive a letter, please call our office to schedule the follow-up appointment.  Your physician has requested that you regularly monitor and record your blood pressure readings at home. Please use the same machine at the same time of day to check your readings and record them to bring to your follow-up visit.   Goal blood pressure of 130/80  Your physician has requested that you have an echocardiogram. Echocardiography is a painless test that uses sound waves to create images of your heart. It provides your doctor with information about the size and shape of your heart and how well your heart's chambers and valves are working. This procedure takes approximately one hour. There are no restrictions for this procedure.

## 2011-03-06 NOTE — Assessment & Plan Note (Signed)
Stable by exam with no evidence of aortic insufficiency. Continue good blood pressure control.

## 2011-03-07 ENCOUNTER — Ambulatory Visit (HOSPITAL_COMMUNITY): Payer: PRIVATE HEALTH INSURANCE | Attending: Cardiology

## 2011-03-07 DIAGNOSIS — Z952 Presence of prosthetic heart valve: Secondary | ICD-10-CM

## 2011-03-07 DIAGNOSIS — I359 Nonrheumatic aortic valve disorder, unspecified: Secondary | ICD-10-CM | POA: Insufficient documentation

## 2011-03-07 DIAGNOSIS — I10 Essential (primary) hypertension: Secondary | ICD-10-CM | POA: Insufficient documentation

## 2011-03-07 DIAGNOSIS — I379 Nonrheumatic pulmonary valve disorder, unspecified: Secondary | ICD-10-CM | POA: Insufficient documentation

## 2011-03-07 DIAGNOSIS — I059 Rheumatic mitral valve disease, unspecified: Secondary | ICD-10-CM | POA: Insufficient documentation

## 2011-03-07 DIAGNOSIS — I079 Rheumatic tricuspid valve disease, unspecified: Secondary | ICD-10-CM | POA: Insufficient documentation

## 2011-03-07 DIAGNOSIS — E785 Hyperlipidemia, unspecified: Secondary | ICD-10-CM | POA: Insufficient documentation

## 2011-03-07 DIAGNOSIS — Z954 Presence of other heart-valve replacement: Secondary | ICD-10-CM | POA: Insufficient documentation

## 2011-03-10 ENCOUNTER — Emergency Department: Payer: Self-pay | Admitting: Emergency Medicine

## 2011-03-23 ENCOUNTER — Other Ambulatory Visit: Payer: Self-pay | Admitting: Cardiology

## 2011-08-08 ENCOUNTER — Emergency Department: Payer: Self-pay | Admitting: Unknown Physician Specialty

## 2011-08-09 ENCOUNTER — Inpatient Hospital Stay: Payer: Self-pay | Admitting: Internal Medicine

## 2011-08-09 LAB — CBC
HCT: 33.8 % — ABNORMAL LOW (ref 40.0–52.0)
HCT: 34.7 % — ABNORMAL LOW (ref 40.0–52.0)
HGB: 11 g/dL — ABNORMAL LOW (ref 13.0–18.0)
HGB: 11.4 g/dL — ABNORMAL LOW (ref 13.0–18.0)
MCH: 26.4 pg (ref 26.0–34.0)
MCH: 26.4 pg (ref 26.0–34.0)
MCHC: 32.5 g/dL (ref 32.0–36.0)
MCHC: 32.9 g/dL (ref 32.0–36.0)
MCV: 80 fL (ref 80–100)
Platelet: 219 10*3/uL (ref 150–440)
RBC: 4.32 10*6/uL — ABNORMAL LOW (ref 4.40–5.90)
RDW: 15.9 % — ABNORMAL HIGH (ref 11.5–14.5)
WBC: 10.3 10*3/uL (ref 3.8–10.6)
WBC: 8.3 10*3/uL (ref 3.8–10.6)

## 2011-08-09 LAB — CBC WITH DIFFERENTIAL/PLATELET
Basophil #: 0 10*3/uL (ref 0.0–0.1)
Basophil %: 0.1 %
Comment - H1-Com1: NORMAL
Comment - H1-Com2: NORMAL
Eosinophil #: 0 10*3/uL (ref 0.0–0.7)
Eosinophil %: 0 %
HGB: 8.4 g/dL — ABNORMAL LOW (ref 13.0–18.0)
Lymphocyte %: 15.9 %
MCH: 26.4 pg (ref 26.0–34.0)
Monocyte #: 0.6 x10 3/mm (ref 0.2–1.0)
Monocyte %: 5.9 %
Neutrophil #: 7.5 10*3/uL — ABNORMAL HIGH (ref 1.4–6.5)
Neutrophil %: 78.1 %
Platelet: 214 10*3/uL (ref 150–440)
RBC: 3.2 10*6/uL — ABNORMAL LOW (ref 4.40–5.90)
RDW: 16.2 % — ABNORMAL HIGH (ref 11.5–14.5)
Segmented Neutrophils: 79 %
WBC: 9.6 10*3/uL (ref 3.8–10.6)

## 2011-08-09 LAB — COMPREHENSIVE METABOLIC PANEL
Alkaline Phosphatase: 70 U/L (ref 50–136)
BUN: 35 mg/dL — ABNORMAL HIGH (ref 7–18)
Calcium, Total: 8.2 mg/dL — ABNORMAL LOW (ref 8.5–10.1)
Chloride: 103 mmol/L (ref 98–107)
Co2: 25 mmol/L (ref 21–32)
Glucose: 113 mg/dL — ABNORMAL HIGH (ref 65–99)
Osmolality: 283 (ref 275–301)
SGPT (ALT): 24 U/L
Sodium: 137 mmol/L (ref 136–145)

## 2011-08-09 LAB — PROTIME-INR: INR: 2.4

## 2011-08-10 LAB — CBC WITH DIFFERENTIAL/PLATELET
Basophil #: 0 10*3/uL (ref 0.0–0.1)
Basophil %: 0.3 %
Eosinophil #: 0 10*3/uL (ref 0.0–0.7)
Eosinophil %: 0.1 %
HCT: 26.2 % — ABNORMAL LOW (ref 40.0–52.0)
HCT: 24.6 % — ABNORMAL LOW (ref 40.0–52.0)
HGB: 8.4 g/dL — ABNORMAL LOW (ref 13.0–18.0)
HGB: 8 g/dL — ABNORMAL LOW (ref 13.0–18.0)
Lymphocyte #: 1.8 10*3/uL (ref 1.0–3.6)
Lymphocyte %: 16.7 %
Lymphocyte %: 13.7 %
MCHC: 32.5 g/dL (ref 32.0–36.0)
Monocyte #: 1.2 x10 3/mm — ABNORMAL HIGH (ref 0.2–1.0)
Monocyte #: 1.5 x10 3/mm — ABNORMAL HIGH (ref 0.2–1.0)
Neutrophil %: 72.9 %
Platelet: 166 10*3/uL (ref 150–440)
RBC: 3.19 10*6/uL — ABNORMAL LOW (ref 4.40–5.90)
RDW: 16.5 % — ABNORMAL HIGH (ref 11.5–14.5)
WBC: 11.9 10*3/uL — ABNORMAL HIGH (ref 3.8–10.6)

## 2011-08-10 LAB — BASIC METABOLIC PANEL
BUN: 62 mg/dL — ABNORMAL HIGH (ref 7–18)
EGFR (African American): 54 — ABNORMAL LOW
Osmolality: 298 (ref 275–301)
Potassium: 3.6 mmol/L (ref 3.5–5.1)

## 2011-08-10 LAB — PROTIME-INR
INR: 1.6
Prothrombin Time: 19.7 secs — ABNORMAL HIGH (ref 11.5–14.7)

## 2011-08-10 LAB — HEMOGLOBIN A1C: Hemoglobin A1C: 5.1 % (ref 4.2–6.3)

## 2011-08-11 LAB — CBC WITH DIFFERENTIAL/PLATELET
Basophil %: 0.7 %
Eosinophil #: 0.1 10*3/uL (ref 0.0–0.7)
Eosinophil %: 0.6 %
HCT: 25.2 % — ABNORMAL LOW (ref 40.0–52.0)
Lymphocyte %: 14.8 %
Monocyte #: 0.9 x10 3/mm (ref 0.2–1.0)
Monocyte %: 8.8 %
Neutrophil #: 7.8 10*3/uL — ABNORMAL HIGH (ref 1.4–6.5)
RBC: 3.05 10*6/uL — ABNORMAL LOW (ref 4.40–5.90)
RDW: 16 % — ABNORMAL HIGH (ref 11.5–14.5)

## 2011-08-11 LAB — BASIC METABOLIC PANEL
Anion Gap: 10 (ref 7–16)
BUN: 25 mg/dL — ABNORMAL HIGH (ref 7–18)
Calcium, Total: 7.8 mg/dL — ABNORMAL LOW (ref 8.5–10.1)
Chloride: 105 mmol/L (ref 98–107)
Creatinine: 1.09 mg/dL (ref 0.60–1.30)
EGFR (African American): >60
EGFR (Non-African Amer.): >60
Glucose: 108 mg/dL — ABNORMAL HIGH (ref 65–99)
Osmolality: 286 (ref 275–301)
Potassium: 3.8 mmol/L (ref 3.5–5.1)
Sodium: 141 mmol/L (ref 136–145)

## 2011-08-17 DIAGNOSIS — G902 Horner's syndrome: Secondary | ICD-10-CM

## 2011-08-17 HISTORY — DX: Horner's syndrome: G90.2

## 2011-08-20 ENCOUNTER — Encounter: Payer: PRIVATE HEALTH INSURANCE | Admitting: Physician Assistant

## 2011-09-03 ENCOUNTER — Ambulatory Visit: Payer: Self-pay | Admitting: Ophthalmology

## 2011-09-04 ENCOUNTER — Emergency Department (HOSPITAL_COMMUNITY)
Admission: EM | Admit: 2011-09-04 | Discharge: 2011-09-05 | Disposition: A | Discharge status: Home / Self Care | Payer: PRIVATE HEALTH INSURANCE | Attending: Emergency Medicine | Admitting: Emergency Medicine

## 2011-09-04 ENCOUNTER — Encounter (HOSPITAL_COMMUNITY): Payer: Self-pay | Admitting: Emergency Medicine

## 2011-09-04 ENCOUNTER — Emergency Department (HOSPITAL_COMMUNITY): Payer: PRIVATE HEALTH INSURANCE

## 2011-09-04 DIAGNOSIS — I7101 Dissection of thoracic aorta: Secondary | ICD-10-CM | POA: Insufficient documentation

## 2011-09-04 DIAGNOSIS — R0602 Shortness of breath: Secondary | ICD-10-CM | POA: Insufficient documentation

## 2011-09-04 DIAGNOSIS — I71012 Dissection of descending thoracic aorta: Secondary | ICD-10-CM

## 2011-09-04 DIAGNOSIS — I71019 Dissection of thoracic aorta, unspecified: Secondary | ICD-10-CM | POA: Insufficient documentation

## 2011-09-04 DIAGNOSIS — R011 Cardiac murmur, unspecified: Secondary | ICD-10-CM | POA: Insufficient documentation

## 2011-09-04 DIAGNOSIS — M549 Dorsalgia, unspecified: Secondary | ICD-10-CM | POA: Insufficient documentation

## 2011-09-04 DIAGNOSIS — H539 Unspecified visual disturbance: Secondary | ICD-10-CM | POA: Insufficient documentation

## 2011-09-04 DIAGNOSIS — I10 Essential (primary) hypertension: Secondary | ICD-10-CM | POA: Insufficient documentation

## 2011-09-04 LAB — COMPREHENSIVE METABOLIC PANEL
AST: 31 U/L (ref 0–37)
Albumin: 3.8 g/dL (ref 3.5–5.2)
Alkaline Phosphatase: 72 U/L (ref 39–117)
CO2: 20 mEq/L (ref 19–32)
Chloride: 103 mEq/L (ref 96–112)
GFR calc Af Amer: 73 mL/min — ABNORMAL LOW (ref >90)
Potassium: 3.9 mEq/L (ref 3.5–5.1)
Total Bilirubin: 0.3 mg/dL (ref 0.3–1.2)

## 2011-09-04 LAB — CBC
HCT: 31.7 % — ABNORMAL LOW (ref 39.0–52.0)
Hemoglobin: 10.2 g/dL — ABNORMAL LOW (ref 13.0–17.0)
MCHC: 32.2 g/dL (ref 30.0–36.0)
MCV: 84.3 fL (ref 78.0–100.0)

## 2011-09-04 LAB — POCT I-STAT, CHEM 8
BUN: 20 mg/dL (ref 6–23)
Chloride: 104 mEq/L (ref 96–112)
HCT: 31 % — ABNORMAL LOW (ref 39.0–52.0)
Potassium: 3.9 mEq/L (ref 3.5–5.1)
Sodium: 139 mEq/L (ref 135–145)

## 2011-09-04 LAB — DIFFERENTIAL
Basophils Relative: 1 % (ref 0–1)
Eosinophils Absolute: 0.1 10*3/uL (ref 0.0–0.7)
Lymphocytes Relative: 21 % (ref 12–46)
Lymphs Abs: 1.1 10*3/uL (ref 0.7–4.0)
Monocytes Relative: 6 % (ref 3–12)
Neutro Abs: 3.8 10*3/uL (ref 1.7–7.7)

## 2011-09-04 LAB — PROTIME-INR
INR: 1.9 — ABNORMAL HIGH (ref 0.00–1.49)
Prothrombin Time: 22.1 seconds — ABNORMAL HIGH (ref 11.6–15.2)

## 2011-09-04 MED ORDER — IOHEXOL 350 MG/ML SOLN
100.0000 mL | Freq: Once | INTRAVENOUS | Status: AC | PRN
  Administered 2011-09-04: 100 mL via INTRAVENOUS

## 2011-09-04 MED ORDER — SODIUM CHLORIDE 0.9 % IV SOLN
INTRAVENOUS | Status: DC
  Administered 2011-09-04: 21:00:00 via INTRAVENOUS
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
## 2011-09-04 NOTE — ED Notes (Signed)
Patient was called by primary MD to come straight to [facility] for thoracic aortic aneurysm. Patient had CT scan on Monday and results were called to PCP today.  Patient denies any chest pain, shortness of breath, does admit some lower back pain.

## 2011-09-05 ENCOUNTER — Institutional Professional Consult (permissible substitution) (INDEPENDENT_AMBULATORY_CARE_PROVIDER_SITE_OTHER): Payer: PRIVATE HEALTH INSURANCE | Admitting: Thoracic Surgery (Cardiothoracic Vascular Surgery)

## 2011-09-05 ENCOUNTER — Encounter: Payer: Self-pay | Admitting: Thoracic Surgery (Cardiothoracic Vascular Surgery)

## 2011-09-05 VITALS — BP 113/68 | HR 67 | Resp 16 | Ht 70.0 in | Wt 215.0 lb

## 2011-09-05 DIAGNOSIS — Z952 Presence of prosthetic heart valve: Secondary | ICD-10-CM

## 2011-09-05 DIAGNOSIS — I7101 Dissection of thoracic aorta: Secondary | ICD-10-CM

## 2011-09-05 DIAGNOSIS — Z954 Presence of other heart-valve replacement: Secondary | ICD-10-CM

## 2011-09-05 DIAGNOSIS — Z9889 Other specified postprocedural states: Secondary | ICD-10-CM

## 2011-09-05 DIAGNOSIS — I7123 Aneurysm of the descending thoracic aorta, without rupture: Secondary | ICD-10-CM

## 2011-09-05 DIAGNOSIS — I712 Thoracic aortic aneurysm, without rupture: Secondary | ICD-10-CM

## 2011-09-05 DIAGNOSIS — Z8679 Personal history of other diseases of the circulatory system: Secondary | ICD-10-CM

## 2011-09-05 DIAGNOSIS — R040 Epistaxis: Secondary | ICD-10-CM

## 2011-09-05 DIAGNOSIS — G459 Transient cerebral ischemic attack, unspecified: Secondary | ICD-10-CM

## 2011-09-05 DIAGNOSIS — G902 Horner's syndrome: Secondary | ICD-10-CM

## 2011-09-05 DIAGNOSIS — I71012 Dissection of descending thoracic aorta: Secondary | ICD-10-CM

## 2011-09-05 HISTORY — DX: Epistaxis: R04.0

## 2011-09-05 HISTORY — DX: Aneurysm of the descending thoracic aorta, without rupture: I71.23

## 2011-09-05 HISTORY — DX: Thoracic aortic aneurysm, without rupture: I71.2

## 2011-09-05 HISTORY — DX: Transient cerebral ischemic attack, unspecified: G45.9

## 2011-09-05 HISTORY — DX: Horner's syndrome: G90.2

## 2011-09-05 NOTE — Discharge Instructions (Signed)
Thoracic Aortic Aneurysm An aneurysm is the enlargement (dilatation), bulging or ballooning out of part of the wall of a vein or artery. An Aortic Aneurysm is a bulging in the largest artery of the body. This artery supplies blood from the heart to the rest of the body. The first part of the aorta is called the thoracic aorta. It leaves the heart, ascends (rises), arches, and descends (goes down) through the chest until it reaches the diaphragm (the muscular partition between the chest and abdomen (belly). The second part of the aorta is then called the abdominal aorta after it has passed the diaphragm and continues down through the abdomen. The abdominal aorta ends where it splits to form the two iliac arteries that go to the legs. Aortic aneurysms can develop anywhere along the length of the aorta. A thoracic aortic aneurysm (TAA) occurs in the first part of the aorta, between the heart and the diaphragm. The major importance of an aneurysm is that it can rupture or tear (dissect), causing death unless diagnosed and treated promptly. CAUSES  Most thoracic aortic aneurysms are related to arteriosclerosis. The arteriosclerosis can weaken the aortic wall. The pressure of the blood being pumped through the aorta causes it to balloon out at the site of weakness. Therefore, elevated blood pressure (hypertension) is associated with aneurysm. Other risk factors include:  Age over 60.   Tobacco use.   Male sex.   Family history of aneurysm.  Additional causes of thoracic aortic aneurysms include:  Genetics (passed by birth).   Injury: After physical trauma to the aorta.   Inflammation of blood vessels.   Hardening of the arteries.   Infection.  SYMPTOMS  Many aneurysms do not cause problems. A small, unchanging or slowly changing aneurysm may produce no symptoms until it suddenly ruptures or dissects (separation of the layers of the aortic wall) without warning. It may then cause death. The  symptoms (problems) of a developing aneurysm will partly depend on its size and rate of growth. Thoracic aortic aneurysms may cause pain in the:  Chest.   Back.   Sides.   Abdomen.  The pain most often has a deep quality as if it is boring into the person. It may cause:  Heart failure.   Heart attack.   Hoarseness.   Cough.   Shortness of breath.   Swallowing problems.  DIAGNOSIS  A thoracic aortic aneurysm may be suspected based on your symptoms. It may also be detected by x-ray or CT studies done for unrelated reasons.  Several different imaging studies can be used to confirm a TAA:  An echocardiogram is an ultrasound test to examine the heart. It can also examine the first parts of the aorta. Sometimes, this test is done by putting you to sleep and inserting a flexible telescope through your mouth into your esophagus, which is next to the aorta; excellent pictures of the aorta can be obtained. This is called a transesophageal echocardiogram (TEE).   CT scanning of the chest is accurate at showing the exact size and shape of the aneurysm.   MRI scanning is accurate, and is used for certain types of TAA.   An aortic angiogram shows the source of the major blood vessels arising from the aorta. It reveals the size and extent of any aneurysm. It can also show a clot clinging to the wall of the aneurysm (mural thrombus). The angiogram may give information about a tear of the aorta.  TREATMENT  Treatment for a   thoracic aortic aneurysm depends on:  Location.   Size.   Other factors.   Rate of growth.   Underlying cause.  Medical treatment is used for smaller or complicated aneurysms, or those that do not cause symptoms. These include:  Stopping smoking.   Blood pressure control.   Control of cholesterol.  Surgical treatment is used for aneurysms that cause symptoms, or for those that are large or growing in size. The surgical technique depends on the location of the  aneurysm. HOME CARE INSTRUCTIONS   If you smoke, stop. If you do not, do not start.   Take all medications as prescribed.   Your caregiver will tell you when to have your aneurysm rechecked, either by echocardiogram or CT scan. Be sure to keep this and all follow-up appointments.  SEEK MEDICAL CARE IF:   You develop mild pain in your chest, upper back, sides, or abdomen.   You develop cough, hoarseness or trouble swallowing.  SEEK IMMEDIATE MEDICAL CARE IF:   You develop severe chest or abdominal pain, or severe pain moving (radiating) to your back.   You suddenly develop cold or blue toes or feet.   You suddenly develop lightheadedness or fainting spells.   You develop trouble breathing.  Document Released: 03/04/2005 Document Revised: 02/21/2011 Document Reviewed: 01/21/2007 [employer] Patient Information 2012 [employer] 

## 2011-09-05 NOTE — Progress Notes (Signed)
[address]            [place] [address]          [phone]     CARDIOTHORACIC SURGERY CONSULTATION REPORT  Referring Provider is Billee Cashing., MD PCP is Clydell Hakim, MD  Chief Complaint  Patient presents with  . Thoracic Aortic Aneurysm    CTA Chest/Abd/Pelvis on 09/04/2011    HPI:  Patient is a 66 year old [place] from [place] with known history of long-standing hypertension and aortic dissection. The patient originally underwent aortic valve replacement using a St. Jude mechanical valve in 2000 by Dr. Andi Devon [facility]. 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 The patient underwent emergency replacement of the ascending thoracic aorta. He did quite well. In February 2008 he presented with new acute type B aortic dissection. This was treated medically. He was last seen here in our office on 05/12/2006. He did not return for further followup appointments as recommended for follow up CT scans.  Over the past 5 years she has been followed by his primary care physician and his cardiologist, but to the patient's knowledge she has not had a followup CT scan performed. 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 Apparently an MRI of the brain and upper chest was ordered by his ophthalmologist, and this revealed the presence of an aortic dissection. The patient was contacted late yesterday afternoon while he was in the middle of a 50 mile bike ride. He was told to go straight to the emergency room. He was seen in the [facility] emergency room late last night where CT angiogram of the chest abdomen and pelvis was performed. This revealed chronic type B dissection with similar appearance to the last CT scan in the [facility] system performed in 2008, although the size of the proximal descending thoracic aorta was notably  enlarged. The patient now presents to our office for followup.  The patient specifically denies any pain in the chest or upper back. He has no difficulty swallowing. He has been doing quite well and remains very active physically, although 3 weeks ago he has a problem with severe epistaxis due to coumadin therapy that required hospitalization and blood transfusion.  He reports no problems with long-term blood pressure control. He has had history of would've been described as TIAs in the past that were manifested as intermittent dizzy spells and transient visual disturbances, but the patient states that these spells all went away completely once his blood pressure medications were adjusted.  Past Medical History  Diagnosis Date  . Hyperlipidemia, mixed   . Hypertension     unspecified  . Chest pain, unspecified   . Peripheral vascular disease   . History of aortic valve replacement 03/06/2011    St. Jude mechanical valve via right mini thoracotomy by Dr Gasper Lloyd at [facility] 2000   . 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 History of thoracic aortic aneurysm repair 02/09/2002    straight graft replacement of ascending thoracic aorta for acute type A aortic dissection (DeBakey type II) with hemiarch distal arch anastamosis and supracoronary proximal anastamosis   . Ascending aortic dissection 02/09/2002    straight graft replacement of ascending thoracic aorta for acute type A aortic dissection (  DeBakey type II) with hemiarch distal arch anastamosis and supracoronary proximal anastamosis   . Epistaxis 09/05/2011  . TIA (transient ischemic attack) 09/05/2011    Attributed to antihypertensive medications  . 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 Cardiac catheterization   . Cardiac valve replacement   . Vascular surgery     Family History  Problem Relation Age of Onset    . Coronary artery disease      Social History History  Substance Use Topics  . Smoking status: Never Smoker   . Smokeless tobacco: Not on file  . Alcohol Use: Yes    Current Outpatient Prescriptions  Medication Sig Dispense Refill  . amLODipine (NORVASC) 5 MG tablet Take 5 mg by mouth daily.      . cloNIDine (CATAPRES) 0.2 MG tablet Take 0.2 mg by mouth 2 (two) times daily.        [name] Coral Calcium 1000 (390 CA) MG TABS Take 1 tablet by mouth 2 (two) times daily.      . Ferrous Sulfate (IRON) 325 (65 FE) MG TABS Take 1 tablet by mouth daily.      . metoprolol tartrate (LOPRESSOR) 25 MG tablet Take 12.5 mg by mouth 2 (two) times daily.      . pravastatin (PRAVACHOL) 80 MG tablet Take 40 mg by mouth daily.       . Prenatal Vit-Fe Fumarate-FA (MULTIVITAMIN-PRENATAL) 27-0.8 MG TABS Take 1 tablet by mouth daily.      . valsartan-hydrochlorothiazide (DIOVAN-HCT) 320-25 MG per tablet TAKE 1 TABLET EVERY EVENING  30 tablet  11  . vitamin B-12 (CYANOCOBALAMIN) 1000 MCG tablet Take 1,000 mcg by mouth daily.      [name] warfarin (COUMADIN) 10 MG tablet Take 10 mg by mouth daily. Takes 10 mg  On Tuesday Thursday Sat and Sun      . warfarin (COUMADIN) 5 MG tablet Take 5 mg by mouth daily. Takes Monday Wed and Friday       No current facility-administered medications for this visit.   Facility-Administered Medications Ordered in Other Visits  Medication Dose Route Frequency Provider Last Rate Last Dose  . iohexol (OMNIPAQUE) 350 MG/ML injection 100 mL  100 mL Intravenous Once PRN Medication Radiologist, MD   100 mL at 09/04/11 2231  . DISCONTD: 0.9 %  sodium chloride infusion   Intravenous Continuous Juliet Rude. Rubin Payor, MD 125 mL/hr at 09/04/11 2112      Allergies  Allergen Reactions  . Penicillins Nausea Only    Review of Systems:  General:  normal appetite, normal energy   Respiratory:  no cough, no wheezing, no hemoptysis, no pain with inspiration or cough, no shortness of  breath  Cardiac:   no chest pain or tightness, no exertional SOB, no resting SOB, no PND, no orthopnea, no LE edema, no palpitations, no syncope  GI:   no difficulty swallowing, no hematochezia, no hematemesis, no melena, no constipation, no diarrhea   GU:   no dysuria, no urgency, no frequency   Musculoskeletal: no arthritis, no arthralgia   Vascular:  no pain suggestive of claudication   Neuro:   no symptoms suggestive of TIA's, no seizures, no headaches, no peripheral neuropathy   Endocrine:  Negative   HEENT:  no loose teeth or painful teeth,  no recent vision changes  Psych:   no anxiety, no depression    Physical Exam:   BP 113/68  Pulse  67  Resp 16  Ht 5\' 10"  (1.778 m)  Wt 97.523 kg (215 lb)  BMI 30.85 kg/m2  SpO2 95%  General:    well-appearing  HEENT:  Unremarkable   Neck:   no JVD, no bruits, no adenopathy   Chest:   clear to auscultation, symmetrical breath sounds, no wheezes, no rhonchi   CV:   RRR, no murmur, mechanical sounds   Abdomen:  soft, non-tender, no masses   Extremities:  warm, well-perfused, pulses palpable throughout, no LE edema  Rectal/GU  Deferred  Neuro:   Grossly non-focal and symmetrical throughout  Skin:   Clean and dry, no rashes, no breakdown   Diagnostic Tests:  *RADIOLOGY REPORT*  Clinical Data: Assess for worsening aortic dissection; shortness  of breath and mild back pain.  CT ANGIOGRAPHY CHEST, ABDOMEN AND PELVIS  Technique: Multidetector CT imaging through the chest, abdomen and  pelvis was performed using the standard protocol during bolus  administration of intravenous contrast. Multiplanar reconstructed  images including MIPs were obtained and reviewed to evaluate the  vascular anatomy.  Contrast: OMNIPAQUE IOHEXOL 350 MG/ML SOLN  Comparison: CTA of the chest performed 05/12/2006  CTA CHEST  Findings: The patient is status post surgical repair of the  ascending portion of the dissection, with an associated aortic  valve  replacement. Fenestrations are noted at the superior and  inferior aspects of the dissection; the dissection flap is more  stable in appearance than in 2008.  There has been interval enlargement of the patient's descending  thoracic aorta. It now measures approximately 7.6 x 6.4 cm just  below the aortic arch, gradually decreasing in diameter to 4.4 cm  in AP dimension at the level of the diaphragm. Both true and false  lumens are fully patent, due to the fenestrations; the majority of  the abdominal vessels arise from the smaller anterior lumen.  A small focal outpouching from the posterior medial wall of the  aortic arch is stable in appearance, and may reflect a penetrating  aortic ulcer or tiny saccular aneurysm.  The great vessels are grossly unremarkable in appearance, and  appear fully patent. There is no evidence of central pulmonary  embolus.  Mild atelectasis is noted about the descending thoracic aortic  aneurysm. Minimal right basilar atelectasis is also seen. The  lungs are otherwise clear. There is no evidence of significant  focal consolidation, pleural effusion or pneumothorax. No masses  are identified; no abnormal focal contrast enhancement is seen.  Scattered coronary artery calcifications are seen. The mediastinum  is otherwise grossly unremarkable in appearance. No mediastinal  lymphadenopathy is seen. No pericardial effusion is identified.  The patient is status post median sternotomy. No axillary  lymphadenopathy is seen. The visualized portions of the thyroid  gland are unremarkable in appearance.  No acute osseous abnormalities are seen.  Review of the MIP images confirms the above findings.  IMPRESSION:  1. Status post surgical repair of a prior Stanford type A aortic  dissection, with an associated aortic valve replacement.  Fenestrations noted at the superior and inferior aspects of the  dissection; the dissection flap has a relatively stable appearance.   Interval enlargement of the associated descending thoracic aortic  aneurysm to 7.6 x 6.4 cm just below the aortic arch, gradually  decreasing to 4.4 cm in AP diameter at the level of the diaphragm.  The aneurysm measured approximately 5.4 x 4.3 cm in 2008. Both  true and false lumens appear fully patent, due to  fenestrations.  2. The small apparent penetrating aortic ulceration or focal tiny  saccular aneurysm at the posterior aortic arch is stable in  appearance.  3. Mild bilateral atelectasis noted; lungs otherwise clear.  4. Scattered coronary artery calcifications seen.  CTA ABDOMEN AND PELVIS  Findings: The patient's dissection extends along the course of the  abdominal aorta, to just above the aortic bifurcation. The smaller  anterior compartment supplies the celiac trunk, superior mesenteric  artery and two right-sided renal arteries. The left-sided renal  artery arises from the posterior compartment; there is mildly  decreased enhancement of the left kidney, reflecting slightly less  flow in the posterior compartment of the abdominal aorta.  Inferior to the inferior mesenteric artery, which arises from the  anterior compartment, there is occlusion of the posterior  compartment with thrombus and an adjacent fenestration, and both  common iliac arteries arise from the anterior compartment.  The abdominal aorta gradually returns to normal caliber just below  the level of the renal arteries; it measures 3.4 cm in AP dimension  at the level of the renal arteries.  Innumerable scattered hypodensities throughout the liver likely  reflects cysts, grossly stable from the prior study. The spleen is  unremarkable in appearance. The pancreas and adrenal glands are  within normal limits. The gallbladder is unremarkable in  appearance.  Aside from mildly decreased enhancement of the left kidney, the  kidneys are relatively symmetric. Nonspecific perinephric  stranding is noted  bilaterally. No renal or ureteral stones are  seen. There is no evidence of hydronephrosis.  No free fluid is identified. The small bowel is unremarkable in  appearance. The stomach is within normal limits. Diffuse  calcification is noted along the distal abdominal aorta and its  branches.  The appendix is normal in caliber and contains air, without  evidence for appendicitis. The colon is unremarkable in  appearance.  The bladder is mildly distended and grossly unremarkable in  appearance. The prostate is borderline enlarged, measuring 4.9 cm  in transverse dimension, with scattered calcification. No inguinal  lymphadenopathy is seen.  No acute osseous abnormalities are identified.  Review of the MIP images confirms the above findings.  IMPRESSION:  1. Aortic dissection extends along the abdominal aorta, to just  above the aortic bifurcation. Associated aneurysmal dilatation  gradually resolves along the length of the abdominal aorta,  measuring 3.4 cm in AP dimension at the level of the renal  arteries, and demonstrating normal caliber just below the renal  arteries.  Smaller anterior compartment of the dissection supplies most of the  abdominal vessels, except at the left sided renal artery. There is  slightly less flow within the posterior compartment, resulting in  mildly decreased left renal enhancement. The inferior fenestration  appears patent; the posterior compartment occludes just below the  fenestration.  2. Diffuse calcification along the distal abdominal aorta and its  branches.  3. Innumerable hepatic cysts.  4. Borderline enlarged prostate.  Original Report Authenticated By: Tonia Ghent, M.D.   Transthoracic Echocardiography  Patient: [place], [place] MR #: [id number] Study Date: 03/07/2011 Gender: M Age: 41 Height: 177.8cm Weight: 101.2kg BSA: 2.23m^2 Pt. Status: Room:  ATTENDING Olga Millers, MD, [facility] ORDERING Valera Castle, MD REFERRING Valera Castle, MD PERFORMING [facility], [facility] SONOGRAPHER Philomena Course, RDCS cc:  ------------------------------------------------------------ LV EF: 55% - 65%  ------------------------------------------------------------ Indications: Prosthetic valve - aortic V43.3.  ------------------------------------------------------------ History: PMH: Peripheral vascular disease. RBBB. Thoracic aneurysm repair. Type III descending aortic dissection, s/p  repair. Acquired from the patient and from the patient's chart. Chest pain. Bradycardia. Coronary artery disease. Aortic valve disease, post prosthetic replacement, St Jude 2000. Transient ischemic attack. Risk factors: Hypertension. Dyslipidemia.  ------------------------------------------------------------ Study Conclusions  - Left ventricle: The cavity size was normal. Wall thickness was increased in a pattern of mild LVH. Systolic function was normal. The estimated ejection fraction was in the range of 55% to 65%. Wall motion was normal; there were no regional wall motion abnormalities. Features are consistent with a pseudonormal left ventricular filling pattern, with concomitant abnormal relaxation and increased filling pressure (grade 2 diastolic dysfunction). - Aortic valve: A mechanical prosthesis was present. Trivial regurgitation. - Mitral valve: Mild regurgitation. - Left atrium: The atrium was moderately dilated. - Right atrium: The atrium was moderately dilated. Transthoracic echocardiography. M-mode, complete 2D, spectral Doppler, and color Doppler. Height: Height: 177.8cm. Height: 70in. Weight: Weight: 101.2kg. Weight: 222.5lb. Body mass index: BMI: 32kg/m^2. Body surface area: BSA: 2.60m^2. Blood pressure: 114/62. Patient status: Outpatient. Location: [place] [facility]  ------------------------------------------------------------  ------------------------------------------------------------ Left ventricle: The cavity  size was normal. Wall thickness was increased in a pattern of mild LVH. Systolic function was normal. The estimated ejection fraction was in the range of 55% to 65%. Wall motion was normal; there were no regional wall motion abnormalities. Features are consistent with a pseudonormal left ventricular filling pattern, with concomitant abnormal relaxation and increased filling pressure (grade 2 diastolic dysfunction).  ------------------------------------------------------------ Aortic valve: A mechanical prosthesis was present. Doppler: Trivial regurgitation. VTI ratio of LVOT to aortic valve: 0.45. Indexed valve area: 0.93cm^2/m^2 (VTI). Peak velocity ratio of LVOT to aortic valve: 0.43. Indexed valve area: 0.89cm^2/m^2 (Vmax). Mean gradient: 12mm Hg (S). Peak gradient: 22mm Hg (S).  ------------------------------------------------------------ Aorta: Aortic root: The aortic root was normal in size.  ------------------------------------------------------------ Mitral valve: Structurally normal valve. Leaflet separation was normal. Doppler: Transvalvular velocity was within the normal range. There was no evidence for stenosis. Mild regurgitation. Peak gradient: 4mm Hg (D).  ------------------------------------------------------------ Left atrium: The atrium was moderately dilated.  ------------------------------------------------------------ Right ventricle: The cavity size was normal. Systolic function was normal.  ------------------------------------------------------------ Pulmonic valve: Structurally normal valve. Cusp separation was normal. Doppler: Transvalvular velocity was within the normal range. Mild regurgitation.  ------------------------------------------------------------ Tricuspid valve: Structurally normal valve. Leaflet separation was normal. Doppler: Transvalvular velocity was within the normal range. Mild  regurgitation.  ------------------------------------------------------------ Right atrium: The atrium was moderately dilated.  ------------------------------------------------------------ Pericardium: There was no pericardial effusion.  ------------------------------------------------------------ Systemic veins: Inferior vena cava: The vessel was normal in size; the respirophasic diameter changes were in the normal range (= 50%); findings are consistent with normal central venous pressure.  ------------------------------------------------------------  2D measurements Normal Doppler measurements Norma Left ventricle l LVID ED, 43.5 mm 43-52 Main pulmonary artery chord, Pressure, 28 mm Hg =30 PLAX S LVID ES, 25.9 mm 23-38 Left ventricle chord, Ea, lat 6.7 cm/s ----- PLAX ann, tiss 3 FS, 40 % >29 DP chord, E/Ea, lat 14. ----- PLAX ann, tiss 01 LVPW, ED 10.12 mm ------ DP IVS/LVPW 1.22 <1.3 Ea, med 4.9 cm/s ----- ratio, ED ann, tiss 7 Ventricular septum DP IVS, ED 12.37 mm ------ E/Ea, med 18. ----- LVOT ann, tiss 97 Diam, S 24 mm ------ DP Area 4.52 cm^2 ------ LVOT Diam 24 mm ------ Peak vel, 100 cm/s ----- Aorta S Root 33 mm ------ VTI, S 25. cm ----- diam, ED 5 Left atrium HR 45 bpm ----- AP dim 48 mm ------ Stroke vol 115 ml ----- AP dim  2.19 cm/m^2 <2.2 .4 index Cardiac 5.2 L/min ----- output Cardiac 2.4 L/(min-m ----- index ^2) Stroke 52. ml/m^2 ----- index 7 Aortic valve Peak vel, 232 cm/s ----- S Mean vel, 164 cm/s ----- S VTI, S 56. cm ----- 9 Mean 12 mm Hg ----- gradient, S Peak 22 mm Hg ----- gradient, S VTI ratio 0.4 ----- LVOT/AV 5 Area index 0.9 cm^2/m^2 ----- (VTI) 3 Peak vel 0.4 ----- ratio, 3 LVOT/AV Area index 0.8 cm^2/m^2 ----- (Vmax) 9 Mitral valve Peak E vel 94. cm/s ----- 3 Peak A vel 53. cm/s ----- 3 Decelerati 183 ms 150-2 on time 30 Peak 4 mm Hg ----- gradient, D Peak E/A 1.8 ----- ratio Tricuspid valve Regurg  242 cm/s ----- peak vel Peak RV-RA 23 mm Hg ----- gradient, S Systemic veins Estimated 5 mm Hg ----- CVP Right ventricle Pressure, 28 mm Hg <30 S Sa vel, 8.3 cm/s ----- lat ann, 8 tiss DP Pulmonic valve Peak vel, 55. cm/s ----- S 5  ------------------------------------------------------------ Prepared and Electronically Authenticated by  Olga Millers, MD, [facility] 2012-12-20T12:51:56.620    Impression:  The patient has chronic type B aortic dissection dating back to February 2008, having previously undergone straight graft replacement of the ascending thoracic aorta for type a aortic dissection in 2003.  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 To my examination the maximum transverse diameter is just below 6 cm when the measurements are carefully performed in true axial dimensions. However, there is no question that this portion of the aorta has increased in size more than 1 cm since he was last imaged in 2008. The patient has developed Horner syndrome but otherwise is completely asymptomatic and quite active physically.  The patient was last seen by his cardiologist in December of 2012, and at that time he had an echocardiogram performed which looked good with normal left ventricular systolic function. He has not had a stress test performed in recent years.   Plan:  I've discussed the implications of the patient's chronic type B aortic dissection at length with the patient and his family here in the office today. We discussed the implications regarding lifestyle and physical activity as well as the need for extremely careful attention to long-term blood pressure control. We discussed the fact that the proximal portion of the descending thoracic aorta has increased substantially in size over the past 5 years, and as such there is some risk of acute rupture. We discussed the the somewhat complex nature  of surgical alternatives for replacement of this portion of the aorta, particularly given that this patient has had 2 previous cardiac surgical procedures. All of his questions have been been addressed. We will plan to review his CT angiogram with Dr. Laneta Simmers, Dr. Myra Gianotti, and possibly with Dr. Kizzie Bane at [facility] to explore possible surgical options. I've instructed the patient to schedule an appointment with Dr. Daleen Squibb for cardiology followup. We'll plan to see the patient back in 2 weeks.    Salvatore Decent. Cornelius Moras, MD 09/05/2011 2:59 PM

## 2011-09-05 NOTE — ED Provider Notes (Signed)
History     CSN: [id number]  Arrival date & time 09/04/11  2013   First MD Initiated Contact with Patient 09/04/11 2050      Chief Complaint  Patient presents with  . Thoracic Aortic Aneurysm    (Consider location/radiation/quality/duration/timing/severity/associated sxs/prior treatment) The history is provided by the patient.   patient was sent in for evaluation of a thoracic aortic aneurysm. 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 Patient states that he has a history of a cross section. 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 No chest pain. No lightheadedness or dizziness. He is on Coumadin for his valve. He set some mild lower back pain. He road his bike 18 miles today.  Past Medical History  Diagnosis Date  . Hyperlipidemia, mixed   . Hypertension     unspecified  . Chest pain, unspecified   . Peripheral vascular disease   . Aortic valve replaced     Past Surgical History  Procedure Date  . Cardiac catheterization   . Cardiac valve replacement   . Vascular surgery     Family History  Problem Relation Age of Onset  . Coronary artery disease      History  Substance Use Topics  . Smoking status: Never Smoker   . Smokeless tobacco: Not on file  . Alcohol Use: Yes      Review of Systems  Constitutional: Negative for activity change and appetite change.  HENT: Negative for neck stiffness.   Eyes: Positive for visual disturbance. Negative for pain.  Respiratory: Negative for chest tightness and shortness of breath.   Cardiovascular: Negative for chest pain and leg swelling.  Gastrointestinal: Negative for nausea, vomiting, abdominal pain and diarrhea.  Genitourinary: Negative for flank pain.  Musculoskeletal: Positive for back pain.  Skin: Negative for rash.  Neurological: Negative for weakness, numbness and headaches.  Psychiatric/Behavioral:  Negative for behavioral problems.    Allergies  Penicillins  Home Medications   Current Outpatient Rx  Name Route Sig Dispense Refill  . AMLODIPINE BESYLATE 5 MG PO TABS Oral Take 5 mg by mouth daily.    [name] CLONIDINE HCL 0.2 MG PO TABS Oral Take 0.2 mg by mouth 2 (two) times daily.      [name] CORAL CALCIUM 1000 (390 CA) MG PO TABS Oral Take 1 tablet by mouth 2 (two) times daily.    . IRON 325 (65 FE) MG PO TABS Oral Take 1 tablet by mouth daily.    [name] METOPROLOL TARTRATE 25 MG PO TABS Oral Take 12.5 mg by mouth 2 (two) times daily.    [name] PRAVASTATIN SODIUM 80 MG PO TABS Oral Take 40 mg by mouth 2 (two) times daily.     [name] PRENATAL 27-0.8 MG PO TABS Oral Take 1 tablet by mouth daily.    [name] VALSARTAN-HYDROCHLOROTHIAZIDE 320-25 MG PO TABS  TAKE 1 TABLET EVERY EVENING 30 tablet 11  . VITAMIN B-12 1000 MCG PO TABS Oral Take 1,000 mcg by mouth daily.    . WARFARIN SODIUM 10 MG PO TABS Oral Take 10 mg by mouth daily. Takes 10 mg  On Tuesday Thursday Sat and Sun    . WARFARIN SODIUM 5 MG PO TABS Oral Take 5 mg by mouth daily. Takes Monday Wed and Friday      BP 153/74  Pulse 69  Temp 98.6 F (37 C) (Oral)  Resp 22  SpO2 100%  Physical Exam  Nursing note and vitals reviewed. Constitutional: He is oriented to person, place, and time. He appears well-developed and well-nourished.  HENT:  Head: Normocephalic and atraumatic.  Eyes: EOM are normal.       Left pupil around 3 mm. Right pupil 4 mm. Both reactive.  Neck: Normal range of motion. Neck supple.  Cardiovascular: Normal rate and regular rhythm.   Murmur heard. Pulmonary/Chest: Effort normal and breath sounds normal.  Abdominal: Soft. Bowel sounds are normal. He exhibits no distension and no mass. There is no tenderness. There is no rebound and no guarding.  Musculoskeletal: Normal range of motion. He exhibits no edema.  Neurological: He is alert and oriented to person, place, and time. No cranial nerve deficit.  Skin: Skin is warm and  dry.  Psychiatric: He has a normal mood and affect.    ED Course  Procedures (including critical care time)  Labs Reviewed  CBC - Abnormal; Notable for the following:    RBC 3.76 (*)     Hemoglobin 10.2 (*)     HCT 31.7 (*)     RDW 16.0 (*)     All other components within normal limits  PROTIME-INR - Abnormal; Notable for the following:    Prothrombin Time 22.1 (*)     INR 1.90 (*)     All other components within normal limits  COMPREHENSIVE METABOLIC PANEL - Abnormal; Notable for the following:    GFR calc non Af Amer 63 (*)     GFR calc Af Amer 73 (*)     All other components within normal limits  POCT I-STAT, CHEM 8 - Abnormal; Notable for the following:    Hemoglobin 10.5 (*)     HCT 31.0 (*)     All other components within normal limits  DIFFERENTIAL   Ct Angio Chest W/cm &/or Wo Cm  09/05/2011  *RADIOLOGY REPORT*  Clinical Data:  Assess for worsening aortic dissection; shortness of breath and mild back pain.  CT ANGIOGRAPHY CHEST, ABDOMEN AND PELVIS  Technique:  Multidetector CT imaging through the chest, abdomen and pelvis was performed using the standard protocol during bolus administration of intravenous contrast.  Multiplanar reconstructed images including MIPs were obtained and reviewed to evaluate the vascular anatomy.  Contrast: OMNIPAQUE IOHEXOL 350 MG/ML SOLN  Comparison:  CTA of the chest performed 05/12/2006  CTA CHEST  Findings:  The patient is status post surgical repair of the ascending portion of the dissection, with an associated aortic valve replacement.  Fenestrations are noted at the superior and inferior aspects of the dissection; the dissection flap is more stable in appearance than in 2008.  There has been interval enlargement of the patient's descending thoracic aorta.  It now measures approximately 7.6 x 6.4 cm just below the aortic arch, gradually decreasing in diameter to 4.4 cm in AP dimension at the level of the diaphragm.  Both true and false  lumens are fully patent, due to the fenestrations; the majority of the abdominal vessels arise from the smaller anterior lumen.  A small focal outpouching from the posterior medial wall of the aortic arch is stable in appearance, and may reflect a penetrating aortic ulcer or tiny saccular aneurysm.  The great vessels are grossly unremarkable in appearance, and appear fully patent.  There is no evidence of central pulmonary embolus.  Mild atelectasis is noted about the descending thoracic aortic aneurysm.  Minimal right basilar atelectasis is also seen.  The lungs are  otherwise clear.  There is no evidence of significant focal consolidation, pleural effusion or pneumothorax.  No masses are identified; no abnormal focal contrast enhancement is seen.  Scattered coronary artery calcifications are seen.  The mediastinum is otherwise grossly unremarkable in appearance.  No mediastinal lymphadenopathy is seen.  No pericardial effusion is identified. The patient is status post median sternotomy.  No axillary lymphadenopathy is seen.  The visualized portions of the thyroid gland are unremarkable in appearance.  No acute osseous abnormalities are seen.   Review of the MIP images confirms the above findings.  IMPRESSION:  1.  Status post surgical repair of a prior Stanford type A aortic dissection, with an associated aortic valve replacement. Fenestrations noted at the superior and inferior aspects of the dissection; the dissection flap has a relatively stable appearance.  Interval enlargement of the associated descending thoracic aortic aneurysm to 7.6 x 6.4 cm just below the aortic arch, gradually decreasing to 4.4 cm in AP diameter at the level of the diaphragm. The aneurysm measured approximately 5.4 x 4.3 cm in 2008.  Both true and false lumens appear fully patent, due to fenestrations. 2.  The small apparent penetrating aortic ulceration or focal tiny saccular aneurysm at the posterior aortic arch is stable in  appearance. 3.  Mild bilateral atelectasis noted; lungs otherwise clear. 4.  Scattered coronary artery calcifications seen.  CTA ABDOMEN AND PELVIS  Findings:  The patient's dissection extends along the course of the abdominal aorta, to just above the aortic bifurcation.  The smaller anterior compartment supplies the celiac trunk, superior mesenteric artery and two right-sided renal arteries.  The left-sided renal artery arises from the posterior compartment; there is mildly decreased enhancement of the left kidney, reflecting slightly less flow in the posterior compartment of the abdominal aorta.  Inferior to the inferior mesenteric artery, which arises from the anterior compartment, there is occlusion of the posterior compartment with thrombus and an adjacent fenestration, and both common iliac arteries arise from the anterior compartment.  The abdominal aorta gradually returns to normal caliber just below the level of the renal arteries; it measures 3.4 cm in AP dimension at the level of the renal arteries.  Innumerable scattered hypodensities throughout the liver likely reflects cysts, grossly stable from the prior study.  The spleen is unremarkable in appearance.  The pancreas and adrenal glands are within normal limits.  The gallbladder is unremarkable in appearance.  Aside from mildly decreased enhancement of the left kidney, the kidneys are relatively symmetric.  Nonspecific perinephric stranding is noted bilaterally.  No renal or ureteral stones are seen.  There is no evidence of hydronephrosis.  No free fluid is identified.  The small bowel is unremarkable in appearance.  The stomach is within normal limits.  Diffuse calcification is noted along the distal abdominal aorta and its branches.  The appendix is normal in caliber and contains air, without evidence for appendicitis.  The colon is unremarkable in appearance.  The bladder is mildly distended and grossly unremarkable in appearance.  The prostate is  borderline enlarged, measuring 4.9 cm in transverse dimension, with scattered calcification.  No inguinal lymphadenopathy is seen.  No acute osseous abnormalities are identified.   Review of the MIP images confirms the above findings.  IMPRESSION:  1.  Aortic dissection extends along the abdominal aorta, to just above the aortic bifurcation.  Associated aneurysmal dilatation gradually resolves along the length of the abdominal aorta, measuring 3.4 cm in AP dimension at the level of the  renal arteries, and demonstrating normal caliber just below the renal arteries.  Smaller anterior compartment of the dissection supplies most of the abdominal vessels, except at the left sided renal artery.  There is slightly less flow within the posterior compartment, resulting in mildly decreased left renal enhancement.  The inferior fenestration appears patent; the posterior compartment occludes just below the fenestration. 2.  Diffuse calcification along the distal abdominal aorta and its branches. 3.  Innumerable hepatic cysts. 4.  Borderline enlarged prostate.  Original Report Authenticated By: Tonia Ghent, M.D.   Ct Cta Abd/pel W/cm &/or W/o Cm  09/05/2011  *RADIOLOGY REPORT*  Clinical Data:  Assess for worsening aortic dissection; shortness of breath and mild back pain.  CT ANGIOGRAPHY CHEST, ABDOMEN AND PELVIS  Technique:  Multidetector CT imaging through the chest, abdomen and pelvis was performed using the standard protocol during bolus administration of intravenous contrast.  Multiplanar reconstructed images including MIPs were obtained and reviewed to evaluate the vascular anatomy.  Contrast: OMNIPAQUE IOHEXOL 350 MG/ML SOLN  Comparison:  CTA of the chest performed 05/12/2006  CTA CHEST  Findings:  The patient is status post surgical repair of the ascending portion of the dissection, with an associated aortic valve replacement.  Fenestrations are noted at the superior and inferior aspects of the dissection; the  dissection flap is more stable in appearance than in 2008.  There has been interval enlargement of the patient's descending thoracic aorta.  It now measures approximately 7.6 x 6.4 cm just below the aortic arch, gradually decreasing in diameter to 4.4 cm in AP dimension at the level of the diaphragm.  Both true and false lumens are fully patent, due to the fenestrations; the majority of the abdominal vessels arise from the smaller anterior lumen.  A small focal outpouching from the posterior medial wall of the aortic arch is stable in appearance, and may reflect a penetrating aortic ulcer or tiny saccular aneurysm.  The great vessels are grossly unremarkable in appearance, and appear fully patent.  There is no evidence of central pulmonary embolus.  Mild atelectasis is noted about the descending thoracic aortic aneurysm.  Minimal right basilar atelectasis is also seen.  The lungs are otherwise clear.  There is no evidence of significant focal consolidation, pleural effusion or pneumothorax.  No masses are identified; no abnormal focal contrast enhancement is seen.  Scattered coronary artery calcifications are seen.  The mediastinum is otherwise grossly unremarkable in appearance.  No mediastinal lymphadenopathy is seen.  No pericardial effusion is identified. The patient is status post median sternotomy.  No axillary lymphadenopathy is seen.  The visualized portions of the thyroid gland are unremarkable in appearance.  No acute osseous abnormalities are seen.   Review of the MIP images confirms the above findings.  IMPRESSION:  1.  Status post surgical repair of a prior Stanford type A aortic dissection, with an associated aortic valve replacement. Fenestrations noted at the superior and inferior aspects of the dissection; the dissection flap has a relatively stable appearance.  Interval enlargement of the associated descending thoracic aortic aneurysm to 7.6 x 6.4 cm just below the aortic arch, gradually decreasing  to 4.4 cm in AP diameter at the level of the diaphragm. The aneurysm measured approximately 5.4 x 4.3 cm in 2008.  Both true and false lumens appear fully patent, due to fenestrations. 2.  The small apparent penetrating aortic ulceration or focal tiny saccular aneurysm at the posterior aortic arch is stable in appearance. 3.  Mild bilateral  atelectasis noted; lungs otherwise clear. 4.  Scattered coronary artery calcifications seen.  CTA ABDOMEN AND PELVIS  Findings:  The patient's dissection extends along the course of the abdominal aorta, to just above the aortic bifurcation.  The smaller anterior compartment supplies the celiac trunk, superior mesenteric artery and two right-sided renal arteries.  The left-sided renal artery arises from the posterior compartment; there is mildly decreased enhancement of the left kidney, reflecting slightly less flow in the posterior compartment of the abdominal aorta.  Inferior to the inferior mesenteric artery, which arises from the anterior compartment, there is occlusion of the posterior compartment with thrombus and an adjacent fenestration, and both common iliac arteries arise from the anterior compartment.  The abdominal aorta gradually returns to normal caliber just below the level of the renal arteries; it measures 3.4 cm in AP dimension at the level of the renal arteries.  Innumerable scattered hypodensities throughout the liver likely reflects cysts, grossly stable from the prior study.  The spleen is unremarkable in appearance.  The pancreas and adrenal glands are within normal limits.  The gallbladder is unremarkable in appearance.  Aside from mildly decreased enhancement of the left kidney, the kidneys are relatively symmetric.  Nonspecific perinephric stranding is noted bilaterally.  No renal or ureteral stones are seen.  There is no evidence of hydronephrosis.  No free fluid is identified.  The small bowel is unremarkable in appearance.  The stomach is within normal  limits.  Diffuse calcification is noted along the distal abdominal aorta and its branches.  The appendix is normal in caliber and contains air, without evidence for appendicitis.  The colon is unremarkable in appearance.  The bladder is mildly distended and grossly unremarkable in appearance.  The prostate is borderline enlarged, measuring 4.9 cm in transverse dimension, with scattered calcification.  No inguinal lymphadenopathy is seen.  No acute osseous abnormalities are identified.   Review of the MIP images confirms the above findings.  IMPRESSION:  1.  Aortic dissection extends along the abdominal aorta, to just above the aortic bifurcation.  Associated aneurysmal dilatation gradually resolves along the length of the abdominal aorta, measuring 3.4 cm in AP dimension at the level of the renal arteries, and demonstrating normal caliber just below the renal arteries.  Smaller anterior compartment of the dissection supplies most of the abdominal vessels, except at the left sided renal artery.  There is slightly less flow within the posterior compartment, resulting in mildly decreased left renal enhancement.  The inferior fenestration appears patent; the posterior compartment occludes just below the fenestration. 2.  Diffuse calcification along the distal abdominal aorta and its branches. 3.  Innumerable hepatic cysts. 4.  Borderline enlarged prostate.  Original Report Authenticated By: Tonia Ghent, M.D.     1. Descending thoracic aortic dissection     Date: 09/05/2011  Rate: 85  Rhythm: normal sinus rhythm  QRS Axis: normal  Intervals: PR prolonged  ST/T Wave abnormalities: nonspecific ST/T changes  Conduction Disutrbances:right bundle branch block  Narrative Interpretation: Right bundle branch block not present on previous EKG, but he does have a history of right bundle branch block.  Old EKG Reviewed: changes noted     MDM  Patient with thoracic aortic dissection. He's previously had repair  for this and aortic valve replacement. His dissection has enlarged. He has no chest pain. Unknown cause for pupil size discrepancy. Discussed with Dr Cornelius Moras from cardiothoracic surgery, will see him in the office tomorrow. He appears to be stable for discharge  home. Patient is aware of the overall poor prognosis of the disease.        Juliet Rude. Rubin Payor, MD 09/05/11 [phone]

## 2011-09-11 ENCOUNTER — Ambulatory Visit (INDEPENDENT_AMBULATORY_CARE_PROVIDER_SITE_OTHER): Payer: PRIVATE HEALTH INSURANCE | Admitting: Nurse Practitioner

## 2011-09-11 ENCOUNTER — Encounter: Payer: Self-pay | Admitting: Nurse Practitioner

## 2011-09-11 VITALS — BP 120/72 | HR 60 | Ht 70.0 in | Wt 218.0 lb

## 2011-09-11 DIAGNOSIS — I359 Nonrheumatic aortic valve disorder, unspecified: Secondary | ICD-10-CM

## 2011-09-11 DIAGNOSIS — I7100 Dissection of unspecified site of aorta: Secondary | ICD-10-CM

## 2011-09-11 DIAGNOSIS — Z952 Presence of prosthetic heart valve: Secondary | ICD-10-CM

## 2011-09-11 DIAGNOSIS — Z01818 Encounter for other preprocedural examination: Secondary | ICD-10-CM

## 2011-09-11 DIAGNOSIS — Z954 Presence of other heart-valve replacement: Secondary | ICD-10-CM

## 2011-09-11 NOTE — Patient Instructions (Addendum)
Your physician has requested that you have an exercise stress myoview. For further information please visit [email or web address] Please follow instruction sheet, as given.  Your physician has requested that you have an echocardiogram. Echocardiography is a painless test that uses sound waves to create images of your heart. It provides your doctor with information about the size and shape of your heart and how well your heart's chambers and valves are working. This procedure takes approximately one hour. There are no restrictions for this procedure.  Your physician recommends that you schedule a follow-up appointment with Dr. Daleen Squibb, next available appt.

## 2011-09-11 NOTE — Progress Notes (Signed)
Patient Name: Jason Mcdaniel Date of Encounter: 09/11/2011  Primary Care Provider:  Clydell Hakim, MD Primary Cardiologist:  T. Wall, MD  Patient Profile  66 y/o male with h/o St. Jude AVR, Thoracic Ao dissection and repair, and chronic Type B dissection who presents for eval.  Problem List   Past Medical History  Diagnosis Date  . Hyperlipidemia, mixed   . Hypertension     unspecified  . Chest pain, unspecified   . Peripheral vascular disease   . History of aortic valve replacement     a.  St. Jude mechanical valve via right mini thoracotomy by Dr Jason Mcdaniel at [facility] ;  b. 02/2011 Echo EF 55-65%, Gr 2 DD, Triv AI, Mild MR.  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 History of thoracic aortic aneurysm repair 02/09/2002    straight graft replacement of ascending thoracic aorta for acute type A aortic dissection (DeBakey type II) with hemiarch distal arch anastamosis and supracoronary proximal anastamosis   . Ascending aortic dissection 02/09/2002    straight graft replacement of ascending thoracic aorta for acute type A aortic dissection (DeBakey type II) with hemiarch distal arch anastamosis and supracoronary proximal anastamosis   . Epistaxis 09/05/2011    07/2011 - Seen by ENT - resolved.  [name] TIA (transient ischemic attack) 09/05/2011    Attributed to antihypertensive medications  . 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 Cardiac catheterization   . Cardiac valve replacement   . Vascular surgery     Allergies  Allergies  Allergen Reactions  . Penicillins Nausea Only    HPI  66 year old male with the above complex problem list.  He was recently diagnosed with Horner's syndrome and as part of his workup underwent an MRI of the brain and upper chest  which revealed presence of aortic dissection.  This alerted the ordering provider and patient was advised to present to the ED for evaluation.  There, CT angiogram was performed revealing chronic type B dissection with similar appearance to the last CT in 2008 although the size of the proximal descending thoracic aorta was notably enlarged.  Patient was referred to Dr. Cornelius Mcdaniel, who reviewed the CT and plans to further review his CT with a surgical and vascular surgical team for consideration of repair.  Patient was advised to followup with cardiology for preoperative testing.  Jason Mcdaniel has been quite active all of his life.  He continues to exercise and bike regularly and in fact was in the midst of a prolonged bike ride when he received the call to present to the emergency department last week.  He denies experiencing chest pain or dyspnea on exertion.  He has had no PND, orthopnea, dizziness, syncope, edema, or early satiety.  Home Medications  Prior to Admission medications   Medication Sig Start Date End Date Taking? Authorizing Provider  amLODipine (NORVASC) 5 MG tablet Take 5 mg by mouth daily.   Yes Historical Provider, MD  cloNIDine (CATAPRES) 0.2 MG tablet Take 0.2 mg by mouth 2 (two) times daily.     Yes Historical Provider, MD  Coral Calcium 1000 (390 CA) MG TABS Take 1 tablet by mouth 2 (two) times daily.   Yes Historical Provider, MD  Ferrous Sulfate (IRON) 325 (65 FE) MG  TABS Take 1 tablet by mouth daily.   Yes Historical Provider, MD  metoprolol tartrate (LOPRESSOR) 25 MG tablet Take 12.5 mg by mouth 2 (two) times daily.   Yes Historical Provider, MD  pravastatin (PRAVACHOL) 80 MG tablet Take 40 mg by mouth daily.    Yes Historical Provider, MD  Prenatal Vit-Fe Fumarate-FA (MULTIVITAMIN-PRENATAL) 27-0.8 MG TABS Take 1 tablet by mouth daily.   Yes Historical Provider, MD  valsartan-hydrochlorothiazide (DIOVAN-HCT) 320-25 MG per tablet TAKE 1 TABLET EVERY EVENING 03/23/11  Yes Jason Shih, MD  vitamin B-12 (CYANOCOBALAMIN) 1000 MCG tablet Take 1,000 mcg by mouth daily.   Yes Historical Provider, MD  warfarin (COUMADIN) 10 MG tablet Take 10 mg by mouth daily. Takes 10 mg  On Tuesday Thursday Sat and Sun   Yes Historical Provider, MD  warfarin (COUMADIN) 5 MG tablet Take 5 mg by mouth daily. Takes Monday Wed and Friday   Yes Historical Provider, MD    Review of Systems  Patient denies chest pain dyspnea as outlined above.  He is very active.  All other systems reviewed and are otherwise negative except as noted above.  Physical Exam  Blood pressure 120/72, pulse 60, height 5\' 10"  (1.778 m), weight 218 lb (98.884 kg).  General: Pleasant, NAD Psych: Normal affect. Neuro: Alert and oriented X 3. Moves all extremities spontaneously. HEENT: Normal  Neck: Supple without bruits or JVD. Lungs:  Resp regular and unlabored, CTA. Heart: RRR no s3, s4, or murmurs. Abdomen: Soft, non-tender, non-distended, BS + x 4.  Extremities: No clubbing, cyanosis or edema. DP/PT/Radials 2+ and equal bilaterally.  Assessment & Plan  1.  Descending thoracic aortic aneurysm: Significant increase in size since 2008.  He's been seen by thoracic surgery and follows up again in 2 weeks for further discussion regarding possible surgical repair.  We've been asked to perform preoperative evaluation.  In many years since patient's had a stress test and we will order.  2.  Status post aortic valve replacement: Patient has a St. Jude aortic valve.  Will obtain repeat 2-D echocardiogram preoperatively.  Will need to address coumadin bridging.  Jason Ducking, NP 09/11/2011, 2:07 PM

## 2011-09-12 ENCOUNTER — Ambulatory Visit (HOSPITAL_COMMUNITY): Payer: PRIVATE HEALTH INSURANCE | Attending: Cardiology | Admitting: Radiology

## 2011-09-12 DIAGNOSIS — I359 Nonrheumatic aortic valve disorder, unspecified: Secondary | ICD-10-CM

## 2011-09-12 DIAGNOSIS — I080 Rheumatic disorders of both mitral and aortic valves: Secondary | ICD-10-CM | POA: Insufficient documentation

## 2011-09-12 DIAGNOSIS — E785 Hyperlipidemia, unspecified: Secondary | ICD-10-CM | POA: Insufficient documentation

## 2011-09-12 DIAGNOSIS — Z01818 Encounter for other preprocedural examination: Secondary | ICD-10-CM

## 2011-09-12 DIAGNOSIS — I10 Essential (primary) hypertension: Secondary | ICD-10-CM | POA: Insufficient documentation

## 2011-09-12 DIAGNOSIS — Z952 Presence of prosthetic heart valve: Secondary | ICD-10-CM

## 2011-09-12 DIAGNOSIS — I079 Rheumatic tricuspid valve disease, unspecified: Secondary | ICD-10-CM | POA: Insufficient documentation

## 2011-09-12 NOTE — Progress Notes (Signed)
Echocardiogram performed.  

## 2011-09-17 ENCOUNTER — Ambulatory Visit (HOSPITAL_COMMUNITY): Payer: PRIVATE HEALTH INSURANCE | Attending: Cardiology | Admitting: Radiology

## 2011-09-17 VITALS — BP 115/73 | Ht 70.0 in | Wt 215.0 lb

## 2011-09-17 DIAGNOSIS — I739 Peripheral vascular disease, unspecified: Secondary | ICD-10-CM | POA: Insufficient documentation

## 2011-09-17 DIAGNOSIS — Z8673 Personal history of transient ischemic attack (TIA), and cerebral infarction without residual deficits: Secondary | ICD-10-CM | POA: Insufficient documentation

## 2011-09-17 DIAGNOSIS — E785 Hyperlipidemia, unspecified: Secondary | ICD-10-CM | POA: Insufficient documentation

## 2011-09-17 DIAGNOSIS — Z01818 Encounter for other preprocedural examination: Secondary | ICD-10-CM

## 2011-09-17 DIAGNOSIS — I059 Rheumatic mitral valve disease, unspecified: Secondary | ICD-10-CM | POA: Insufficient documentation

## 2011-09-17 DIAGNOSIS — I10 Essential (primary) hypertension: Secondary | ICD-10-CM | POA: Insufficient documentation

## 2011-09-17 DIAGNOSIS — I4510 Unspecified right bundle-branch block: Secondary | ICD-10-CM | POA: Insufficient documentation

## 2011-09-17 DIAGNOSIS — R9431 Abnormal electrocardiogram [ECG] [EKG]: Secondary | ICD-10-CM

## 2011-09-17 DIAGNOSIS — R0609 Other forms of dyspnea: Secondary | ICD-10-CM | POA: Insufficient documentation

## 2011-09-17 DIAGNOSIS — R0989 Other specified symptoms and signs involving the circulatory and respiratory systems: Secondary | ICD-10-CM | POA: Insufficient documentation

## 2011-09-17 DIAGNOSIS — R55 Syncope and collapse: Secondary | ICD-10-CM | POA: Insufficient documentation

## 2011-09-17 DIAGNOSIS — Z8249 Family history of ischemic heart disease and other diseases of the circulatory system: Secondary | ICD-10-CM | POA: Insufficient documentation

## 2011-09-17 MED ORDER — TECHNETIUM TC 99M TETROFOSMIN IV KIT
33.0000 | PACK | Freq: Once | INTRAVENOUS | Status: AC | PRN
  Administered 2011-09-17: 33 via INTRAVENOUS

## 2011-09-17 MED ORDER — REGADENOSON 0.4 MG/5ML IV SOLN
0.4000 mg | Freq: Once | INTRAVENOUS | Status: AC
  Administered 2011-09-17: 0.4 mg via INTRAVENOUS

## 2011-09-17 MED ORDER — TECHNETIUM TC 99M TETROFOSMIN IV KIT
11.0000 | PACK | Freq: Once | INTRAVENOUS | Status: AC | PRN
  Administered 2011-09-17: 11 via INTRAVENOUS

## 2011-09-17 NOTE — Progress Notes (Signed)
[facility] SITE 3 NUCLEAR MED [address] [place] [address] [phone]  Cardiology Nuclear Med Study  Jason Mcdaniel is a 66 y.o. male     MRN : [id number]     DOB: Jan 12, 1946  Procedure Date: 09/17/2011  Nuclear Med Background Indication for Stress Test:  Evaluation for Ischemia, and Pending Surgical Clearance for  Aortic Valve Replacement by a Surgeon at [facility] (followed by Wynn Banker) History:  Chronic type B Aortic dissection "00 AVR Heart Cath: No significant CAD, '03 GXT-'03 Acute type A aortic A dissection with replacement,04/2006 Type 3 New Acute B-A-D Tx Rx 06/10/06: MPS: NL EF: 64% (Adenosine) 09/05/11 CT: Type B descending TAA scattered heart calcification 09/12/11: ECHO: EF:55-60% mild MR, H/O Horner Syndrome Cardiac Risk Factors: CVA, Family History - CAD, Hypertension, Lipids, PVD, RBBB and TIA  Symptoms:  DOE and Syncope   Nuclear Pre-Procedure Caffeine/Decaff Intake:  None > 12 hrs NPO After: 10:30pm   Lungs:  clear O2 Sat: 97% on room air. IV 0.9% NS with Angio Cath:  20g  IV Site: R Antecubital x 1, tolerated well IV Started by:  Irean Hong, RN  Chest Size (in):  46 Cup Size: n/a  Height: 5\' 10"  (1.778 m)  Weight:  215 lb (97.523 kg)  BMI:  Body mass index is 30.85 kg/(m^2). Tech Comments:  Held metoprolol this am    Nuclear Med Study 1 or 2 day study: 1 day  Stress Test Type:  Lexiscan  Reading MD: Marca Ancona, MD  Order Authorizing Provider:  Valera Castle, MD, and Nicolasa Ducking, NP  Resting Radionuclide: Technetium 72m Tetrofosmin  Resting Radionuclide Dose: 11.0 mCi   Stress Radionuclide:  Technetium 79m Tetrofosmin  Stress Radionuclide Dose: 33.0 mCi           Stress Protocol Rest HR: 53 Stress HR: 68  Rest BP: 115/73 Stress BP: 137/64  Exercise Time (min): n/a METS: n/a          Dose of Adenosine (mg):  n/a Dose of Lexiscan: 0.4 mg  Dose of Atropine (mg): n/a Dose of Dobutamine: n/a mcg/kg/min (at max HR)    Stress Test Technologist: Milana Na, EMT-P  Nuclear Technologist:  Domenic Polite, CNMT     Rest Procedure:  Myocardial perfusion imaging was performed at rest 45 minutes following the intravenous administration of Technetium 69m Tetrofosmin. Rest ECG: NSR-RBBB  Stress Procedure:  The patient received IV Lexiscan 0.4 mg over 15-seconds.  Technetium 24m Tetrofosmin injected at 30-seconds.  There were no significant changes  And burning in his chest with Lexiscan.  Quantitative spect images were obtained after a 45 minute delay. Stress ECG: No significant change from baseline ECG  QPS Raw Data Images:  Normal; no motion artifact; normal heart/lung ratio. Stress Images:  Normal homogeneous uptake in all areas of the myocardium. Rest Images:  Normal homogeneous uptake in all areas of the myocardium. Subtraction (SDS):  There is no evidence of scar or ischemia. Transient Ischemic Dilatation (Normal <1.22):  1.01 Lung/Heart Ratio (Normal <0.45):  0.28  Quantitative Gated Spect Images QGS EDV:  128 ml QGS ESV:  41 ml  Impression Exercise Capacity:  Lexiscan with no exercise. BP Response:  Normal blood pressure response. Clinical Symptoms:  Burning in chest. ECG Impression:  No significant ST segment change suggestive of ischemia. Comparison with Prior Nuclear Study: No images to compare  Overall Impression:  Normal stress nuclear study.  LV Ejection Fraction: 68%.  LV Wall Motion:  NL  LV Function; NL Wall Motion  Marca Ancona 09/17/2011

## 2011-09-23 ENCOUNTER — Ambulatory Visit (INDEPENDENT_AMBULATORY_CARE_PROVIDER_SITE_OTHER): Payer: PRIVATE HEALTH INSURANCE | Admitting: Thoracic Surgery (Cardiothoracic Vascular Surgery)

## 2011-09-23 ENCOUNTER — Ambulatory Visit: Payer: PRIVATE HEALTH INSURANCE | Admitting: Thoracic Surgery (Cardiothoracic Vascular Surgery)

## 2011-09-23 ENCOUNTER — Encounter: Payer: Self-pay | Admitting: Thoracic Surgery (Cardiothoracic Vascular Surgery)

## 2011-09-23 VITALS — BP 113/68 | HR 64 | Resp 18 | Ht 70.0 in | Wt 220.0 lb

## 2011-09-23 DIAGNOSIS — I7101 Dissection of thoracic aorta: Secondary | ICD-10-CM

## 2011-09-23 DIAGNOSIS — Z9889 Other specified postprocedural states: Secondary | ICD-10-CM

## 2011-09-23 DIAGNOSIS — I71019 Dissection of thoracic aorta, unspecified: Secondary | ICD-10-CM

## 2011-09-23 DIAGNOSIS — Z954 Presence of other heart-valve replacement: Secondary | ICD-10-CM

## 2011-09-23 DIAGNOSIS — Z952 Presence of prosthetic heart valve: Secondary | ICD-10-CM

## 2011-09-23 DIAGNOSIS — I712 Thoracic aortic aneurysm, without rupture, unspecified: Secondary | ICD-10-CM | POA: Insufficient documentation

## 2011-09-23 DIAGNOSIS — Z8679 Personal history of other diseases of the circulatory system: Secondary | ICD-10-CM | POA: Insufficient documentation

## 2011-09-23 NOTE — Progress Notes (Signed)
[address]            [place] [address]          [phone]     CARDIOTHORACIC SURGERY OFFICE NOTE  Referring Provider is Fisher, Glennon Hamilton, * PCP is Clydell Hakim, MD Primary cardiologist is WALL, Maisie Fus, MD   HPI:  Patient returns for follow up of chronic type B aortic dissection associated with enlarging aneurysmal dilatation of the descending thoracic aorta.  Had some pain in the right upper back this morning, but it went away. Complained of a feeling of a pinched nerve in the neck that spreads downward but only occurs with certain movements, causes numbness. Continues to exercise through regular walks in the early mornings without any problems.  No symptoms suspicious of pain related to his aneurysmal aorta.   Current Outpatient Prescriptions  Medication Sig Dispense Refill  . amLODipine (NORVASC) 5 MG tablet Take 5 mg by mouth daily.      . cloNIDine (CATAPRES) 0.2 MG tablet Take 0.2 mg by mouth 2 (two) times daily.        [name] Coral Calcium 1000 (390 CA) MG TABS Take 1 tablet by mouth 2 (two) times daily.      . Ferrous Sulfate (IRON) 325 (65 FE) MG TABS Take 1 tablet by mouth daily.      . metoprolol tartrate (LOPRESSOR) 25 MG tablet Take 12.5 mg by mouth 2 (two) times daily.      . pravastatin (PRAVACHOL) 80 MG tablet Take 40 mg by mouth daily.       . Prenatal Vit-Fe Fumarate-FA (MULTIVITAMIN-PRENATAL) 27-0.8 MG TABS Take 1 tablet by mouth daily.      . valsartan-hydrochlorothiazide (DIOVAN-HCT) 320-25 MG per tablet TAKE 1 TABLET EVERY EVENING  30 tablet  11  . vitamin B-12 (CYANOCOBALAMIN) 1000 MCG tablet Take 1,000 mcg by mouth daily.      [name] warfarin (COUMADIN) 10 MG tablet Take 10 mg by mouth daily. Takes 10 mg  On Tuesday Thursday Sat and Sun      . warfarin (COUMADIN) 5 MG tablet Take 5 mg by mouth daily. Takes Monday Wed and Friday          Physical Exam:   BP 113/68  Pulse 64  Resp 18  Ht 5\' 10"  (1.778 m)  Wt 220  lb (99.791 kg)  BMI 31.57 kg/m2  SpO2 97%  General:  Well appearing  Chest:   clear  CV:   RRR  Incisions:  n/a  Abdomen:  soft  Extremities:  warm  Diagnostic Tests:  Transthoracic Echocardiography  Patient: Jason Mcdaniel, Jason Mcdaniel MR #: [id number] Study Date: 09/12/2011 Gender: M Age: 38 Height: 177.8cm Weight: 98.9kg BSA: 2.60m^2 Pt. Status: Room:  SONOGRAPHER Marilynn Rail Valera Castle, MD Darryl Lent, Christopher REFERRING Brion Aliment, Christopher ATTENDING Marca Ancona PERFORMING [facility], Site 3 cc:  ------------------------------------------------------------ LV EF: 55% - 60%  ------------------------------------------------------------ Indications: 424.1 Aortic valve disorders.  ------------------------------------------------------------ History: PMH: Horner's syndrome. CT angio chest - interval enlargement of descending thoracic aorta. Aortic valve disease. Risk factors: Hypertension. Dyslipidemia.  ------------------------------------------------------------ Study Conclusions  - Left ventricle: The cavity size was normal. There was mild focal basal hypertrophy of the septum. Systolic function was normal. The estimated ejection fraction was in the range of 55% to 60%. Wall motion was normal; there were no regional wall motion abnormalities. Features are consistent with a  pseudonormal left ventricular filling pattern, with concomitant abnormal relaxation and increased filling pressure (grade 2 diastolic dysfunction). - Aortic valve: A bioprosthesis was present. - Left atrium: The atrium was mildly dilated. - Right atrium: The atrium was mildly dilated. - Atrial septum: There was an atrial septal aneurysm. Impressions:  - S/P AVR; aortic root, ascending and descending aorta mildly dilated; suggest CTA or MRA to further assess if clinically indicated.  ------------------------------------------------------------ Labs, prior tests,  procedures, and surgery: Thoracic Aortic dissection and repair. Valve surgery. Aortic valve replacement. Transthoracic echocardiography. M-mode, complete 2D, spectral Doppler, and color Doppler. Height: Height: 177.8cm. Height: 70in. Weight: Weight: 98.9kg. Weight: 217.5lb. Body mass index: BMI: 31.3kg/m^2. Body surface area: BSA: 2.44m^2. Blood pressure: 120/72. Patient status: Outpatient. Location: [facility]  ------------------------------------------------------------  ------------------------------------------------------------ Left ventricle: The cavity size was normal. There was mild focal basal hypertrophy of the septum. Systolic function was normal. The estimated ejection fraction was in the range of 55% to 60%. Wall motion was normal; there were no regional wall motion abnormalities. Features are consistent with a pseudonormal left ventricular filling pattern, with concomitant abnormal relaxation and increased filling pressure (grade 2 diastolic dysfunction).  ------------------------------------------------------------ Aortic valve: A bioprosthesis was present. Doppler: No regurgitation. VTI ratio of LVOT to aortic valve: 0.42. Peak velocity ratio of LVOT to aortic valve: 0.4. Mean gradient: 14mm Hg (S). Peak gradient: 28mm Hg (S).  ------------------------------------------------------------ Aorta: Aortic root: The aortic root was mildly dilated. Ascending aorta: The ascending aorta was mildly dilated. Descending aorta: The descending aorta was mildly dilated.  ------------------------------------------------------------ Mitral valve: Structurally normal valve. Mobility was not restricted. Doppler: Transvalvular velocity was within the normal range. There was no evidence for stenosis. Trivial regurgitation.  ------------------------------------------------------------ Left atrium: The atrium was mildly  dilated.  ------------------------------------------------------------ Atrial septum: There was an atrial septal aneurysm.  ------------------------------------------------------------ Right ventricle: The cavity size was normal. Systolic function was normal.  ------------------------------------------------------------ Pulmonic valve: Doppler: Transvalvular velocity was within the normal range. There was no evidence for stenosis.  ------------------------------------------------------------ Tricuspid valve: Structurally normal valve. Doppler: Transvalvular velocity was within the normal range. Mild regurgitation.  ------------------------------------------------------------ Pulmonary artery: Systolic pressure was within the normal range.  ------------------------------------------------------------ Right atrium: The atrium was mildly dilated.  ------------------------------------------------------------ Pericardium: There was no pericardial effusion.  ------------------------------------------------------------  2D measurements Normal Doppler Normal Left ventricle measurements LVID ED, 42.9 mm 43-52 Main pulmonary chord, artery PLAX Pressure, S 21 mm =30 LVID ES, 29.3 mm 23-38 Hg chord, Left ventricle PLAX Ea, lat 8.11 cm/ ------- FS, 32 % >29 ann, tiss s chord, DP PLAX E/Ea, lat 7.92 ------- LVPW, ED 10.63 mm ------ ann, tiss IVS/LVPW 1.41 <1.3 DP ratio, ED Ea, med 6.03 cm/ ------- Ventricular septum ann, tiss s IVS, ED 14.97 mm ------ DP Aorta E/Ea, med 10.65 ------- Root 40 mm ------ ann, tiss diam, ED DP Left atrium LVOT AP dim 54 mm ------ Peak vel, S 105 cm/ ------- AP dim 2.49 cm/m^2 <2.2 s index VTI, S 24.9 cm ------- Aortic valve Peak vel, S 265 cm/ ------- s Mean vel, S 172 cm/ ------- s VTI, S 58.6 cm ------- Mean 14 mm ------- gradient, S Hg Peak 28 mm ------- gradient, S Hg VTI ratio 0.42 ------- LVOT/AV Peak vel 0.4  ------- ratio, LVOT/AV Mitral valve Peak E vel 64.2 cm/ ------- s Peak A vel 51.8 cm/ ------- s Deceleratio 173 ms 150-230 n time Peak E/A 1.2 ------- ratio Tricuspid valve Regurg peak 203 cm/ ------- vel s Peak RV-RA 16 mm -------  gradient, S Hg Systemic veins Estimated 5 mm ------- CVP Hg Right ventricle Pressure, S 21 mm <30 Hg Sa vel, lat 12.1 cm/ ------- ann, tiss s DP  ------------------------------------------------------------ Prepared and Electronically Authenticated by  Olga Millers 2013-06-27T16:[id number]    Impression:  Chronic type B aortic dissection with 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  Recent echocardiogram demonstrates normal left ventricular size and systolic function. Stress test has been ordered and not yet completed.   Plan:  Options regarding possible endovascular treatment of the patient's enlarging descending thoracic aortic aneurysm with chronic dissection were discussed at length. The patient desires to be referred to Dr. Kizzie Bane at [facility] for possible surgical intervention. We will make arrangements for this referral as soon as possible. All of his questions been addressed.   Salvatore Decent. Cornelius Moras, MD 09/23/2011 12:19 PM

## 2011-09-26 ENCOUNTER — Other Ambulatory Visit: Payer: Self-pay | Admitting: Thoracic Surgery (Cardiothoracic Vascular Surgery)

## 2011-12-01 DIAGNOSIS — I350 Nonrheumatic aortic (valve) stenosis: Secondary | ICD-10-CM | POA: Insufficient documentation

## 2011-12-01 DIAGNOSIS — Z8673 Personal history of transient ischemic attack (TIA), and cerebral infarction without residual deficits: Secondary | ICD-10-CM | POA: Insufficient documentation

## 2011-12-01 DIAGNOSIS — R011 Cardiac murmur, unspecified: Secondary | ICD-10-CM | POA: Insufficient documentation

## 2011-12-02 HISTORY — PX: ABDOMINAL AORTIC ANEURYSM REPAIR: SUR1152

## 2011-12-09 DIAGNOSIS — R768 Other specified abnormal immunological findings in serum: Secondary | ICD-10-CM | POA: Insufficient documentation

## 2011-12-17 ENCOUNTER — Institutional Professional Consult (permissible substitution): Payer: PRIVATE HEALTH INSURANCE | Admitting: Cardiovascular Disease

## 2011-12-31 ENCOUNTER — Ambulatory Visit (INDEPENDENT_AMBULATORY_CARE_PROVIDER_SITE_OTHER): Payer: PRIVATE HEALTH INSURANCE | Admitting: Cardiovascular Disease

## 2011-12-31 ENCOUNTER — Encounter: Payer: Self-pay | Admitting: Cardiovascular Disease

## 2011-12-31 VITALS — BP 140/64 | HR 61 | Ht 70.0 in | Wt 209.5 lb

## 2011-12-31 DIAGNOSIS — I10 Essential (primary) hypertension: Secondary | ICD-10-CM

## 2011-12-31 DIAGNOSIS — Z8679 Personal history of other diseases of the circulatory system: Secondary | ICD-10-CM

## 2011-12-31 DIAGNOSIS — E785 Hyperlipidemia, unspecified: Secondary | ICD-10-CM

## 2011-12-31 DIAGNOSIS — Z9889 Other specified postprocedural states: Secondary | ICD-10-CM

## 2011-12-31 DIAGNOSIS — Z952 Presence of prosthetic heart valve: Secondary | ICD-10-CM

## 2011-12-31 DIAGNOSIS — Z954 Presence of other heart-valve replacement: Secondary | ICD-10-CM

## 2011-12-31 DIAGNOSIS — I712 Thoracic aortic aneurysm, without rupture: Secondary | ICD-10-CM

## 2011-12-31 NOTE — Assessment & Plan Note (Signed)
He has undergone surgery at [facility] for repair of his descending aorta aneurysm and appears to be doing well. Residual left pleural effusion. Not requiring rehabilitation as he is exercising on his own. No significant medication changes made today.

## 2011-12-31 NOTE — Assessment & Plan Note (Signed)
Have asked him to monitor his blood pressure at home. If he continues to run mildly high, he could increase his amlodipine.

## 2011-12-31 NOTE — Patient Instructions (Addendum)
You are doing well. No medication changes were made.  Hold the lasix for dehydration  Please call us if you have new issues that need to be addressed before your next appt.  Your physician wants you to follow-up in: 1 month.

## 2011-12-31 NOTE — Assessment & Plan Note (Signed)
We have encouraged him to stay on his  Statin. No known history of underlying coronary artery disease . Prior catheterization reports not available .

## 2011-12-31 NOTE — Assessment & Plan Note (Signed)
Recent echocardiogram showing well seated aortic valve. Repeat echocardiogram next year.   

## 2011-12-31 NOTE — Progress Notes (Signed)
Jason Mcdaniel ID: Jason Mcdaniel, male    DOB: 25-Apr-1945, 66 y.o.   MRN: [id number]  HPI Comments: Jason Mcdaniel is a very pleasant 66 year old gentleman with aortic valve replacement for bicuspid aortic valve in 2000, repair of a acute type A aortic dissection in 2003 with followup CT imaging demonstrating distal arch and proximal descending thoracic aorta aneurysm estimated at 6.5 cm with recent repair at [facility]. 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 Kizzie Bane 12/02/2011.  Since discharge from the hospital, he has lost 12 pounds of fluid. He continues on Lasix 40 mg daily for chronic left pleural effusion. He was recently seen at [facility] and had chest x-ray, CT scan and lab work. Recent INR 2.1 at Dr. Theodis Aguas office.  He is already doing light exercise. He does have some residual discomfort from his left thoracotomy though reports this has healed well. 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 He did have injection with improvement of his voice for 2 weeks but this has recurred and he is scheduled to have followup with ENT at [facility].  EKG today shows normal sinus rhythm with right bundle branch block, rate 61 beats per minute   Outpatient Encounter Prescriptions as of 12/31/2011  Medication Sig Dispense Refill  . amLODipine (NORVASC) 5 MG tablet Take 5 mg by mouth daily.      [name] aspirin 81 MG tablet Take 81 mg by mouth daily.      . calcium carbonate 200 MG capsule Take 250 mg by mouth 2 (two) times daily with a meal.      . carvedilol (COREG) 25 MG tablet Take 25 mg by mouth 2 (two) times daily with a meal.      . furosemide (LASIX) 40 MG tablet Take 40 mg by mouth daily.      [name] HYDROcodone-acetaminophen (NORCO/VICODIN) 5-325 MG per tablet Take 1 tablet by mouth every 6 (six) hours as needed.      [name] lisinopril (PRINIVIL,ZESTRIL) 5 MG tablet Take 5 mg  by mouth daily.      . potassium chloride (KLOR-CON) 20 MEQ packet Take 20 mEq by mouth daily.      . pravastatin (PRAVACHOL) 80 MG tablet Take 80 mg by mouth daily.       . Prenatal Vit-Fe Fumarate-FA (MULTIVITAMIN-PRENATAL) 27-0.8 MG TABS Take 1 tablet by mouth daily.      . sennosides-docusate sodium (SENOKOT-S) 8.6-50 MG tablet Take 1 tablet by mouth daily.      . vitamin B-12 (CYANOCOBALAMIN) 1000 MCG tablet Take 1,000 mcg by mouth daily.      [name] warfarin (COUMADIN) 10 MG tablet Take 10 mg by mouth daily. Takes 10 mg  On Tuesday Thursday Sat and Sun      . warfarin (COUMADIN) 5 MG tablet Take 5 mg by mouth daily. Takes Monday Wed and Friday       Review of Systems  Constitutional: Positive for fatigue.  HENT: Negative.   Eyes: Negative.   Respiratory: Negative.   Cardiovascular: Negative.   Gastrointestinal: Negative.   Musculoskeletal: Negative.        Well healed thoracotomy incision on the left  Skin: Negative.   Neurological: Negative.   Hematological: Negative.   Psychiatric/Behavioral: Negative.   All other systems reviewed and are negative.    BP 140/64  Pulse 61  Ht 5\' 10"  (1.778 m)  Wt 209 lb 8 oz (95.029 kg)  BMI 30.06 kg/m2  Physical Exam  Nursing note and vitals reviewed. Constitutional: He is oriented to person, place, and time. He appears well-developed and well-nourished.  HENT:  Head: Normocephalic.  Nose: Nose normal.  Mouth/Throat: Oropharynx is clear and moist.  Eyes: Conjunctivae normal are normal. Pupils are equal, round, and reactive to light.  Neck: Normal range of motion. Neck supple. No JVD present.  Cardiovascular: Normal rate, regular rhythm, S1 normal, S2 normal, normal heart sounds and intact distal pulses.  Exam reveals no gallop and no friction rub.   No murmur heard. Pulmonary/Chest: Effort normal. No respiratory distress. He has decreased breath sounds in the left lower field. He has no wheezes. He has no rales. He exhibits no  tenderness.  Abdominal: Soft. Bowel sounds are normal. He exhibits no distension. There is no tenderness.  Musculoskeletal: Normal range of motion. He exhibits no edema and no tenderness.  Lymphadenopathy:    He has no cervical adenopathy.  Neurological: He is alert and oriented to person, place, and time. Coordination normal.  Skin: Skin is warm and dry. No rash noted. No erythema.  Psychiatric: He has a normal mood and affect. His behavior is normal. Judgment and thought content normal.           Assessment and Plan

## 2012-01-28 ENCOUNTER — Telehealth: Payer: Self-pay | Admitting: Cardiovascular Disease

## 2012-01-28 NOTE — Telephone Encounter (Signed)
Pt's wife informed we do have info from [facility]

## 2012-01-28 NOTE — Telephone Encounter (Signed)
Pt has an appt on Mon 11/18.  Pt would like to confirm if we have received information from [facility] prior to the visit.  Please call to advise.

## 2012-02-03 ENCOUNTER — Encounter: Payer: Self-pay | Admitting: Cardiovascular Disease

## 2012-02-03 ENCOUNTER — Ambulatory Visit (INDEPENDENT_AMBULATORY_CARE_PROVIDER_SITE_OTHER): Payer: PRIVATE HEALTH INSURANCE | Admitting: Cardiovascular Disease

## 2012-02-03 VITALS — BP 122/60 | HR 58 | Ht 70.0 in | Wt 209.5 lb

## 2012-02-03 DIAGNOSIS — I7101 Dissection of thoracic aorta: Secondary | ICD-10-CM

## 2012-02-03 DIAGNOSIS — Z954 Presence of other heart-valve replacement: Secondary | ICD-10-CM

## 2012-02-03 DIAGNOSIS — R519 Headache, unspecified: Secondary | ICD-10-CM | POA: Insufficient documentation

## 2012-02-03 DIAGNOSIS — Z952 Presence of prosthetic heart valve: Secondary | ICD-10-CM

## 2012-02-03 DIAGNOSIS — I10 Essential (primary) hypertension: Secondary | ICD-10-CM

## 2012-02-03 DIAGNOSIS — R51 Headache: Secondary | ICD-10-CM | POA: Insufficient documentation

## 2012-02-03 NOTE — Assessment & Plan Note (Signed)
He reports having a history of headaches. Recent headache last week. No neurologic issues.

## 2012-02-03 NOTE — Assessment & Plan Note (Signed)
Recent echocardiogram showing well seated aortic valve. Repeat echocardiogram next year.

## 2012-02-03 NOTE — Assessment & Plan Note (Signed)
He reports having a significant cough. Uncertain if this is from pleural effusion or lisinopril. We have suggested he hold the lisinopril to see if his cough improves. Losartan could be used instead For blood pressure control.

## 2012-02-03 NOTE — Patient Instructions (Addendum)
You are doing well. Please try aleve with tylenol/oxycodone for pain  Hold the lisinopril Monitor your blood pressure If it runs high, call the office. We would start losartan one a day  Please call us if you have new issues that need to be addressed before your next appt.  Your physician wants you to follow-up in: 6 months.  You will receive a reminder letter in the mail two months in advance. If you don't receive a letter, please call our office to schedule the follow-up appointment.

## 2012-02-03 NOTE — Assessment & Plan Note (Signed)
Stable by exam with no evidence of aortic insufficiency. Continue good blood pressure control. Recent evaluation at [facility] by Dr. Kizzie Bane with CT scan performed showing aortic arch and descending aorta graft. Still recovering from surgical procedure with flank pain.

## 2012-02-03 NOTE — Progress Notes (Signed)
Patient ID: Jason Mcdaniel, male    DOB: 1945/03/26, 66 y.o.   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 Kizzie Bane 12/02/2011.  He presents for routine followup and for discussion of his flank pain and recent episode of severe headache. He reports that he continues to have severe left flank pain at the surgical site. He is taking narcotics now just at nighttime and is able to sleep supine in bed. He does have a significant cough that seems to come on worse at nighttime, sometimes to the daytime. Residual laryngeal problems and difficulty talking. Recent followup with Dr. Kizzie Bane. Lasix was discontinued. It was felt that his confusion was improving .  He does report having severe headache last week. Headache lasted all day. Initially he had difficulty walking for a second for headache came on. No neurologic-type issues associated with the headache or event . INR has been running more than 2.5   EKG today shows normal sinus rhythm with right bundle branch block, rate 58 beats per minute   Outpatient Encounter Prescriptions as of 02/03/2012  Medication Sig Dispense Refill  . amLODipine (NORVASC) 5 MG tablet Take 5 mg by mouth daily.      [name] aspirin 81 MG tablet Take 81 mg by mouth daily.      . calcium carbonate 200 MG capsule Take 250 mg by mouth 2 (two) times daily with a meal.      . carvedilol (COREG) 25 MG tablet Take 25 mg by mouth 2 (two) times daily with a meal.      . IRON CR PO Take by mouth daily.      [name] lisinopril (PRINIVIL,ZESTRIL) 5 MG tablet Take 5 mg by mouth daily.      .  pravastatin (PRAVACHOL) 80 MG tablet Take 80 mg by mouth daily.       . Prenatal Vit-Fe Fumarate-FA (MULTIVITAMIN-PRENATAL) 27-0.8 MG TABS Take 1 tablet by mouth daily.      . vitamin B-12 (CYANOCOBALAMIN) 1000 MCG tablet Take 1,000 mcg by mouth daily.      [name] warfarin (COUMADIN) 10 MG tablet Take 10 mg by mouth daily. Takes 10 mg  On Tuesday Thursday Sat and Sun      . warfarin (COUMADIN) 5 MG tablet Take 5 mg by mouth daily. Takes Monday Wed and Friday        Review of Systems  Eyes: Negative.   Respiratory: Negative.   Cardiovascular: Negative.   Gastrointestinal: Negative.   Musculoskeletal: Negative.        Flank pain at the site of thoracotomy incision  Skin: Negative.   Neurological: Positive for headaches.  Hematological: Negative.   Psychiatric/Behavioral: Negative.   All other systems reviewed and are negative.    BP 122/60  Pulse 58  Ht 5\' 10"  (1.778 m)  Wt 209 lb 8 oz (95.029 kg)  BMI 30.06 kg/m2  Physical Exam  Nursing note and vitals reviewed. Constitutional: He is oriented to person, place, and time. He appears well-developed and well-nourished.  HENT:  Head: Normocephalic.  Nose: Nose normal.  Mouth/Throat: Oropharynx is clear and moist.  Eyes:  Conjunctivae normal are normal. Pupils are equal, round, and reactive to light.  Neck: Normal range of motion. Neck supple. No JVD present.  Cardiovascular: Normal rate, regular rhythm, S1 normal, S2 normal and intact distal pulses.  Exam reveals no gallop and no friction rub.   Murmur heard.  Systolic murmur is present with a grade of 2/6  Pulmonary/Chest: Effort normal. No respiratory distress. He has decreased breath sounds in the left lower field. He has no wheezes. He has no rales. He exhibits no tenderness.  Abdominal: Soft. Bowel sounds are normal. He exhibits no distension. There is no tenderness.  Musculoskeletal: Normal range of motion. He exhibits no edema and no tenderness.  Lymphadenopathy:    He has no  cervical adenopathy.  Neurological: He is alert and oriented to person, place, and time. Coordination normal.  Skin: Skin is warm and dry. No rash noted. No erythema.  Psychiatric: He has a normal mood and affect. His behavior is normal. Judgment and thought content normal.           Assessment and Plan

## 2012-10-23 ENCOUNTER — Other Ambulatory Visit: Payer: Self-pay | Admitting: *Deleted

## 2012-10-23 ENCOUNTER — Ambulatory Visit (INDEPENDENT_AMBULATORY_CARE_PROVIDER_SITE_OTHER): Payer: PRIVATE HEALTH INSURANCE | Admitting: Cardiovascular Disease

## 2012-10-23 ENCOUNTER — Encounter: Payer: Self-pay | Admitting: Cardiovascular Disease

## 2012-10-23 VITALS — BP 120/62 | HR 52 | Ht 70.0 in | Wt 212.5 lb

## 2012-10-23 DIAGNOSIS — R109 Unspecified abdominal pain: Secondary | ICD-10-CM

## 2012-10-23 DIAGNOSIS — Z952 Presence of prosthetic heart valve: Secondary | ICD-10-CM

## 2012-10-23 DIAGNOSIS — Z79899 Other long term (current) drug therapy: Secondary | ICD-10-CM

## 2012-10-23 DIAGNOSIS — Z954 Presence of other heart-valve replacement: Secondary | ICD-10-CM

## 2012-10-23 DIAGNOSIS — Z8679 Personal history of other diseases of the circulatory system: Secondary | ICD-10-CM

## 2012-10-23 DIAGNOSIS — R001 Bradycardia, unspecified: Secondary | ICD-10-CM

## 2012-10-23 DIAGNOSIS — Z9889 Other specified postprocedural states: Secondary | ICD-10-CM

## 2012-10-23 DIAGNOSIS — E785 Hyperlipidemia, unspecified: Secondary | ICD-10-CM

## 2012-10-23 DIAGNOSIS — I4510 Unspecified right bundle-branch block: Secondary | ICD-10-CM

## 2012-10-23 DIAGNOSIS — I498 Other specified cardiac arrhythmias: Secondary | ICD-10-CM

## 2012-10-23 NOTE — Assessment & Plan Note (Signed)
Continues to do well following his aortic aneurysm repair. Has close followup at [facility].

## 2012-10-23 NOTE — Assessment & Plan Note (Signed)
Doing well on warfarin. He has indicated he might need a colonoscopy. We will do Lovenox bridging.

## 2012-10-23 NOTE — Assessment & Plan Note (Signed)
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 Improvement by "popping his rib back into place".

## 2012-10-23 NOTE — Patient Instructions (Addendum)
You are doing well. No medication changes were made.  Please call us if you have new issues that need to be addressed before your next appt.  Your physician wants you to follow-up in: 6 months.  You will receive a reminder letter in the mail two months in advance. If you don't receive a letter, please call our office to schedule the follow-up appointment.   

## 2012-10-23 NOTE — Progress Notes (Signed)
Patient ID: Jason Mcdaniel, male    DOB: 03-Sep-1945, 67 y.o.   MRN: [id number]  HPI Comments: Jason Mcdaniel is a very pleasant 67 year old gentleman with aortic valve replacement for bicuspid aortic valve in 2000 with St. 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 Kizzie Bane 12/02/2011.  On his last clinic visit, he had significant left-sided flank pain, headaches. He reports today that headaches have improved. He continues to have periods of left rib pain. Often rib pain improves with palpation and "popping the rib back into place." Residual laryngeal problems and difficulty talking.   EKG today shows normal sinus rhythm with rate of 52 beats per minute with right bundle branch block No recent lab  Outpatient Encounter Prescriptions as of 10/23/2012  Medication Sig Dispense Refill  . amLODipine (NORVASC) 5 MG tablet Take 5 mg by mouth daily.      [name] aspirin 81 MG tablet Take 81 mg by mouth daily.      . calcium carbonate 200 MG capsule Take 250 mg by mouth 2 (two) times daily with a meal.      . carvedilol (COREG) 25 MG tablet Take 25 mg by mouth 2 (two) times daily with a meal.      . pravastatin (PRAVACHOL) 80 MG tablet Take 80 mg by mouth daily.       . Prenatal Vit-Fe Fumarate-FA (MULTIVITAMIN-PRENATAL) 27-0.8 MG TABS Take 1 tablet by mouth daily.      . vitamin B-12 (CYANOCOBALAMIN) 1000 MCG tablet Take 1,000 mcg by mouth daily.      [name] warfarin (COUMADIN) 10 MG tablet Take 10 mg by mouth daily. Takes 10 mg  On Tuesday Thursday Sat and Sun      . warfarin (COUMADIN) 5 MG tablet Take 5 mg by mouth daily. Takes Monday Wed and Friday      . [DISCONTINUED] IRON CR PO Take by mouth daily.       . [DISCONTINUED] lisinopril (PRINIVIL,ZESTRIL) 5 MG tablet Take 5 mg by mouth daily.        Review of Systems  Constitutional: Negative.   HENT: Negative.   Eyes: Negative.   Respiratory: Negative.   Cardiovascular: Negative.   Gastrointestinal: Negative.   Musculoskeletal: Negative.        Flank pain at the site of thoracotomy incision  Skin: Negative.   Neurological: Negative.   Psychiatric/Behavioral: Negative.   All other systems reviewed and are negative.    BP 120/62  Pulse 52  Ht 5\' 10"  (1.778 m)  Wt 212 lb 8 oz (96.389 kg)  BMI 30.49 kg/m2  Physical Exam  Nursing note and vitals reviewed. Constitutional: He is oriented to person, place, and time. He appears well-developed and well-nourished.  HENT:  Head: Normocephalic.  Nose: Nose normal.  Mouth/Throat: Oropharynx is clear and moist.  Eyes: Conjunctivae are normal. Pupils are equal, round, and reactive to light.  Neck: Normal range of motion. Neck supple. No JVD present.  Cardiovascular: Normal rate, regular rhythm, S1 normal, S2 normal and intact distal pulses.  Exam reveals no gallop and no friction rub.   Murmur heard.  Systolic murmur is present with a grade of 2/6  Pulmonary/Chest: Effort normal. No respiratory distress.  He has decreased breath sounds in the left lower field. He has no wheezes. He has no rales. He exhibits no tenderness.  Abdominal: Soft. Bowel sounds are normal. He exhibits no distension. There is no tenderness.  Musculoskeletal: Normal range of motion. He exhibits no edema and no tenderness.  Lymphadenopathy:    He has no cervical adenopathy.  Neurological: He is alert and oriented to person, place, and time. Coordination normal.  Skin: Skin is warm and dry. No rash noted. No erythema.  Psychiatric: He has a normal mood and affect. His behavior is normal. Judgment and thought content normal.      Assessment and Plan

## 2012-11-07 LAB — LIPID PANEL
Cholesterol, Total: 157 mg/dL (ref 100–199)
HDL: 39 mg/dL — ABNORMAL LOW (ref >39)
VLDL Cholesterol Cal: 17 mg/dL (ref 5–40)

## 2012-11-07 LAB — HEPATIC FUNCTION PANEL
AST: 20 IU/L (ref 0–40)
Alkaline Phosphatase: 81 IU/L (ref 39–117)
Total Protein: 6.4 g/dL (ref 6.0–8.5)

## 2012-11-17 ENCOUNTER — Telehealth: Payer: Self-pay

## 2012-11-17 NOTE — Telephone Encounter (Signed)
Spoke w/ pt.  He is aware of results and would like a copy of results mailed to his home.  Sent out 11/17/12.

## 2012-11-17 NOTE — Telephone Encounter (Signed)
Message copied by Marilynne Halsted on Tue Nov 17, 2012 12:05 PM ------      Message from: Jason Mcdaniel      Created: Mon Nov 16, 2012  3:38 PM       Labs look ok      Would continue pravastatin      Recheck next year ------

## 2012-11-17 NOTE — Telephone Encounter (Signed)
Message copied by Marilynne Halsted on Tue Nov 17, 2012  9:21 AM ------      Message from: Antonieta Iba      Created: Mon Nov 16, 2012  3:38 PM       Labs look ok      Would continue pravastatin      Recheck next year ------

## 2012-12-05 IMAGING — CT CT CTA ABD/PEL W/CM AND/OR W/O CM
2 of 7 series · 11 of 46 positions shown, 12 images · IV contrast (APPLIED)
Comparison: CTA of the chest performed 05/12/2006

CTA CHEST

CLINICAL DATA: Assess for worsening aortic dissection; shortness
of breath and mild back pain.

CT ANGIOGRAPHY CHEST, ABDOMEN AND PELVIS
TECHNIQUE: Multidetector CT imaging through the chest, abdomen and
pelvis was performed using the standard protocol during bolus
administration of intravenous contrast.  Multiplanar reconstructed
images including MIPs were obtained and reviewed to evaluate the
vascular anatomy.
Contrast: 100mL OMNIPAQUE IOHEXOL 350 MG/ML SOLN

[Series 4: dissection 2.0 st · axial · 0.84mm/px · z∈[+776,+1300]mm · 8 of 334 slices shown, 9 images]
[im 36/334  soft-tissue]
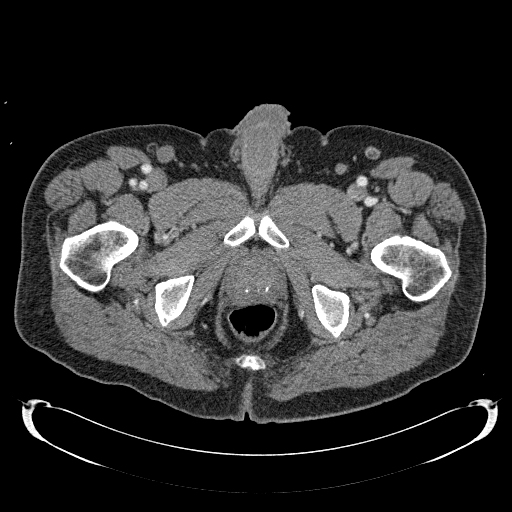
[im 36/334  bone]
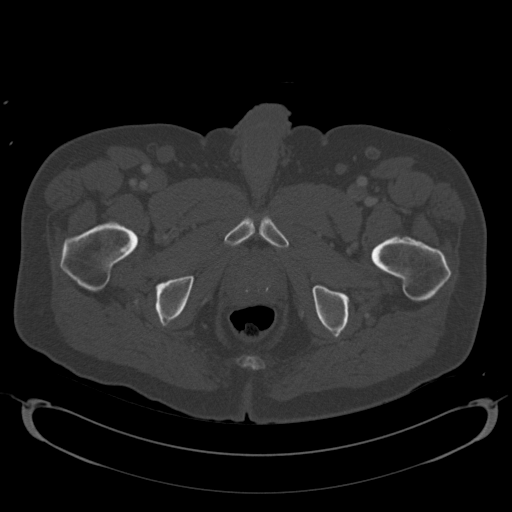
[im 71/334  soft-tissue]
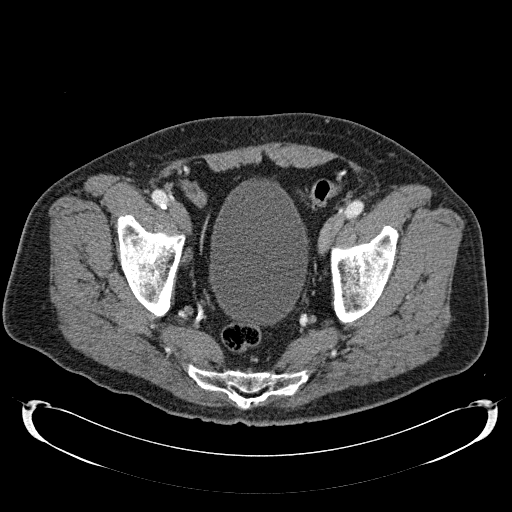
[im 106/334  soft-tissue]
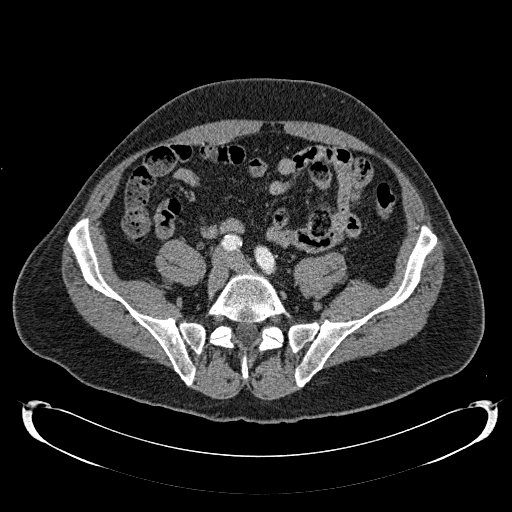
[im 141/334  soft-tissue]
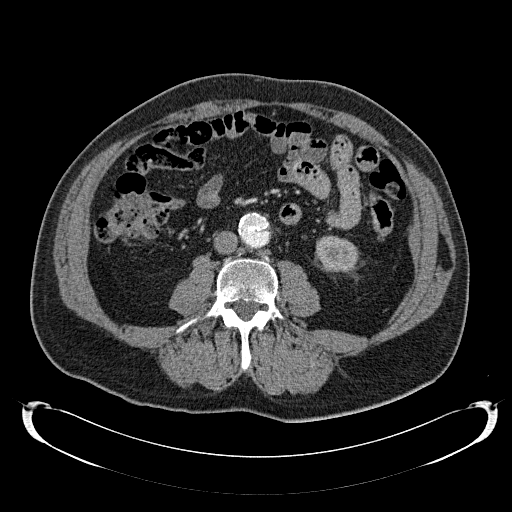
[im 193/334  soft-tissue]
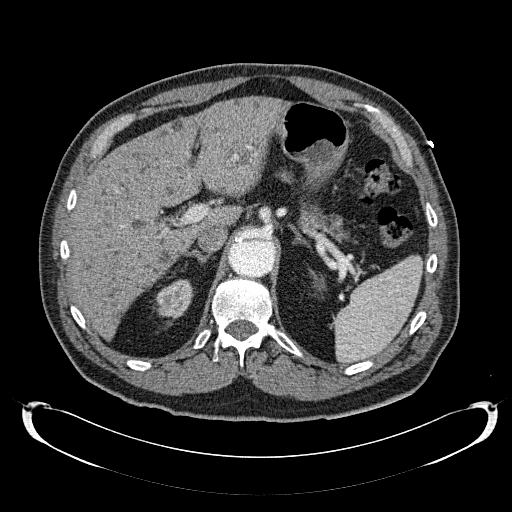
[im 228/334  soft-tissue]
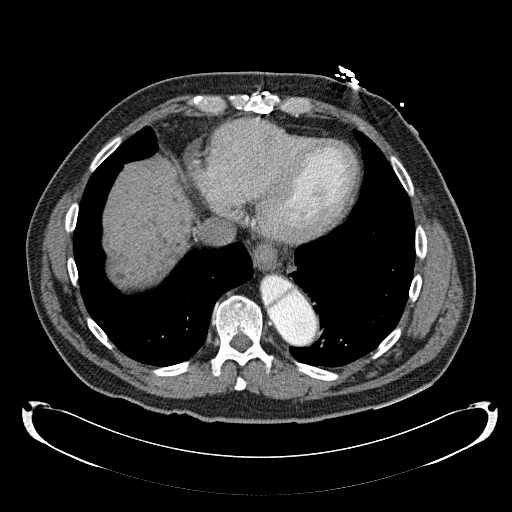
[im 263/334  soft-tissue]
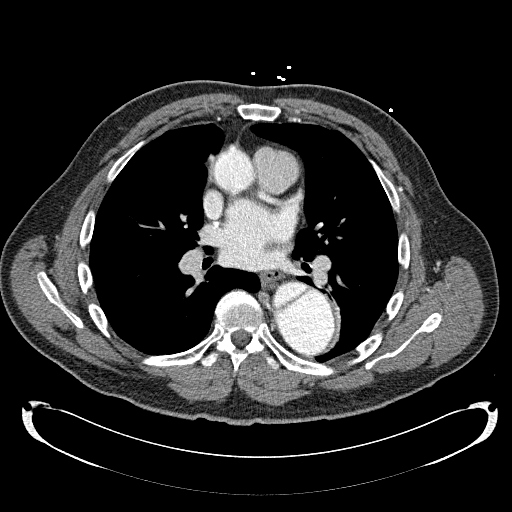
[im 298/334  soft-tissue]
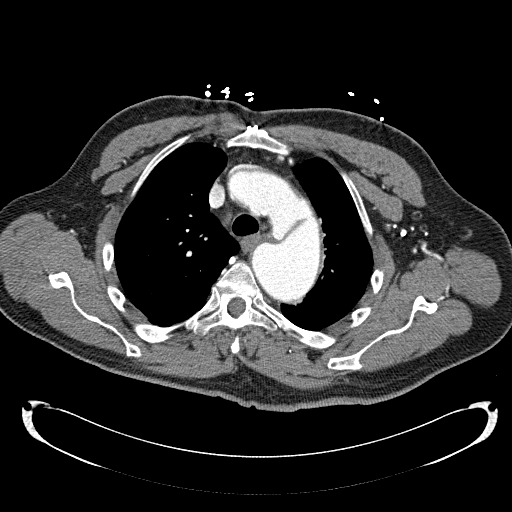

[Series 8: coronals · coronal · 0.75mm/px · 3 of 131 slices shown]
[im 33/131  soft-tissue]
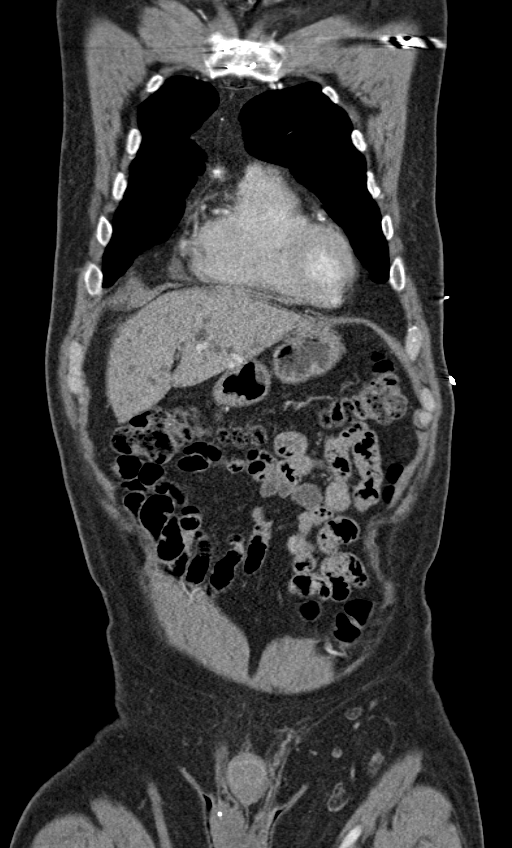
[im 66/131  soft-tissue]
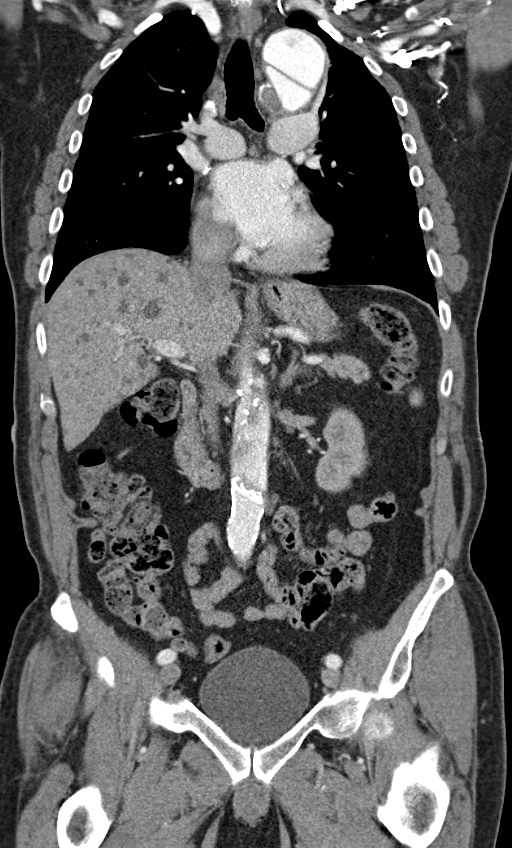
[im 98/131  soft-tissue]
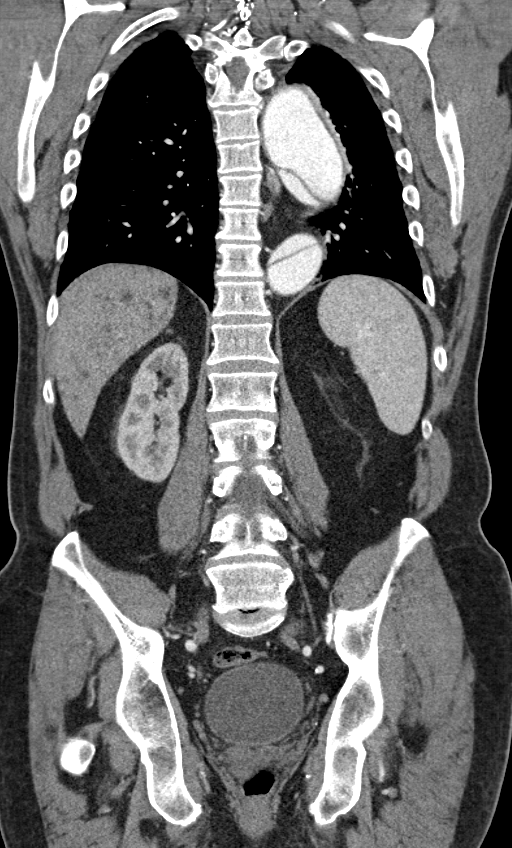

[11 of 46 positions shown; findings below may reference images not displayed]

FINDINGS: The patient is status post surgical repair of the
ascending portion of the dissection, with an associated aortic
valve replacement.  Fenestrations are noted at the superior and
inferior aspects of the dissection; the dissection flap is more
stable in appearance than in 5220.

There has been interval enlargement of the patient's descending
thoracic aorta.  It now measures approximately 7.6 x 6.4 cm just
below the aortic arch, gradually decreasing in diameter to 4.4 cm
in AP dimension at the level of the diaphragm.  Both true and false
lumens are fully patent, due to the fenestrations; the majority of
the abdominal vessels arise from the smaller anterior lumen.

A small focal outpouching from the posterior medial wall of the
aortic arch is stable in appearance, and may reflect a penetrating
aortic ulcer or tiny saccular aneurysm.

The great vessels are grossly unremarkable in appearance, and
appear fully patent.  There is no evidence of central pulmonary
embolus.

Mild atelectasis is noted about the descending thoracic aortic
aneurysm.  Minimal right basilar atelectasis is also seen.  The
lungs are otherwise clear.  There is no evidence of significant
focal consolidation, pleural effusion or pneumothorax.  No masses
are identified; no abnormal focal contrast enhancement is seen.

Scattered coronary artery calcifications are seen.  The mediastinum
is otherwise grossly unremarkable in appearance.  No mediastinal
lymphadenopathy is seen.  No pericardial effusion is identified.
The patient is status post median sternotomy.  No axillary
lymphadenopathy is seen.  The visualized portions of the thyroid
gland are unremarkable in appearance.

No acute osseous abnormalities are seen.

 Review of the MIP images confirms the above findings.
IMPRESSION: 1.  Status post surgical repair of a prior [HOSPITAL] type A aortic
dissection, with an associated aortic valve replacement.
Fenestrations noted at the superior and inferior aspects of the
dissection; the dissection flap has a relatively stable appearance.

Interval enlargement of the associated descending thoracic aortic
aneurysm to 7.6 x 6.4 cm just below the aortic arch, gradually
decreasing to 4.4 cm in AP diameter at the level of the diaphragm.
The aneurysm measured approximately 5.4 x 4.3 cm in 5220.  Both
true and false lumens appear fully patent, due to fenestrations.
2.  The small apparent penetrating aortic ulceration or focal tiny
saccular aneurysm at the posterior aortic arch is stable in
appearance.
3.  Mild bilateral atelectasis noted; lungs otherwise clear.
4.  Scattered coronary artery calcifications seen.

CTA ABDOMEN AND PELVIS
FINDINGS: The patient's dissection extends along the course of the
abdominal aorta, to just above the aortic bifurcation.  The smaller
anterior compartment supplies the celiac trunk, superior mesenteric
artery and two right-sided renal arteries.  The left-sided renal
artery arises from the posterior compartment; there is mildly
decreased enhancement of the left kidney, reflecting slightly less
flow in the posterior compartment of the abdominal aorta.

Inferior to the inferior mesenteric artery, which arises from the
anterior compartment, there is occlusion of the posterior
compartment with thrombus and an adjacent fenestration, and both
common iliac arteries arise from the anterior compartment.

The abdominal aorta gradually returns to normal caliber just below
the level of the renal arteries; it measures 3.4 cm in AP dimension
at the level of the renal arteries.

Innumerable scattered hypodensities throughout the liver likely
reflects cysts, grossly stable from the prior study.  The spleen is
unremarkable in appearance.  The pancreas and adrenal glands are
within normal limits.  The gallbladder is unremarkable in
appearance.

Aside from mildly decreased enhancement of the left kidney, the
kidneys are relatively symmetric.  Nonspecific perinephric
stranding is noted bilaterally.  No renal or ureteral stones are
seen.  There is no evidence of hydronephrosis.

No free fluid is identified.  The small bowel is unremarkable in
appearance.  The stomach is within normal limits.  Diffuse
calcification is noted along the distal abdominal aorta and its
branches.

The appendix is normal in caliber and contains air, without
evidence for appendicitis.  The colon is unremarkable in
appearance.

The bladder is mildly distended and grossly unremarkable in
appearance.  The prostate is borderline enlarged, measuring 4.9 cm
in transverse dimension, with scattered calcification.  No inguinal
lymphadenopathy is seen.

No acute osseous abnormalities are identified.

 Review of the MIP images confirms the above findings.
IMPRESSION: 1.  Aortic dissection extends along the abdominal aorta, to just
above the aortic bifurcation.  Associated aneurysmal dilatation
gradually resolves along the length of the abdominal aorta,
measuring 3.4 cm in AP dimension at the level of the renal
arteries, and demonstrating normal caliber just below the renal
arteries.

Smaller anterior compartment of the dissection supplies most of the
abdominal vessels, except at the left sided renal artery.  There is
slightly less flow within the posterior compartment, resulting in
mildly decreased left renal enhancement.  The inferior fenestration
appears patent; the posterior compartment occludes just below the
fenestration.
2.  Diffuse calcification along the distal abdominal aorta and its
branches.
3.  Innumerable hepatic cysts.
4.  Borderline enlarged prostate.

## 2013-03-18 HISTORY — PX: VOCAL CORD INJECTION: SHX2663

## 2013-04-29 ENCOUNTER — Ambulatory Visit (INDEPENDENT_AMBULATORY_CARE_PROVIDER_SITE_OTHER): Payer: Medicare Other | Admitting: Cardiovascular Disease

## 2013-04-29 ENCOUNTER — Encounter: Payer: Self-pay | Admitting: Cardiovascular Disease

## 2013-04-29 ENCOUNTER — Encounter (INDEPENDENT_AMBULATORY_CARE_PROVIDER_SITE_OTHER): Payer: Self-pay

## 2013-04-29 VITALS — BP 130/68 | HR 53 | Ht 70.0 in | Wt 216.2 lb

## 2013-04-29 DIAGNOSIS — R001 Bradycardia, unspecified: Secondary | ICD-10-CM

## 2013-04-29 DIAGNOSIS — I7101 Dissection of ascending aorta: Secondary | ICD-10-CM

## 2013-04-29 DIAGNOSIS — R109 Unspecified abdominal pain: Secondary | ICD-10-CM

## 2013-04-29 DIAGNOSIS — I10 Essential (primary) hypertension: Secondary | ICD-10-CM

## 2013-04-29 DIAGNOSIS — I71019 Dissection of thoracic aorta, unspecified: Secondary | ICD-10-CM

## 2013-04-29 DIAGNOSIS — I498 Other specified cardiac arrhythmias: Secondary | ICD-10-CM

## 2013-04-29 DIAGNOSIS — I4510 Unspecified right bundle-branch block: Secondary | ICD-10-CM

## 2013-04-29 DIAGNOSIS — J383 Other diseases of vocal cords: Secondary | ICD-10-CM

## 2013-04-29 DIAGNOSIS — E785 Hyperlipidemia, unspecified: Secondary | ICD-10-CM

## 2013-04-29 NOTE — Assessment & Plan Note (Signed)
Doing well following his repair in 2013. He has followup with surgery April 2015. CT scan at that time

## 2013-04-29 NOTE — Assessment & Plan Note (Signed)
Cholesterol is at goal on the current lipid regimen. No changes to the medications were made.  

## 2013-04-29 NOTE — Assessment & Plan Note (Signed)
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 Still has hoarseness, some sputum issues

## 2013-04-29 NOTE — Assessment & Plan Note (Signed)
Residual postsurgical pain. Pushes the rib back into place to alleviate the pain. Suggested he discuss this with Dr. Ysidro Evert

## 2013-04-29 NOTE — Assessment & Plan Note (Signed)
Blood pressure is well controlled on today's visit. No changes made to the medications. 

## 2013-04-29 NOTE — Patient Instructions (Signed)
You are doing well. No medication changes were made.  Please call us if you have new issues that need to be addressed before your next appt.  Your physician wants you to follow-up in: October 2015 You will receive a reminder letter in the mail two months in advance. If you don't receive a letter, please call our office to schedule the follow-up appointment.

## 2013-04-29 NOTE — Progress Notes (Signed)
Patient ID: Jason Mcdaniel, male    DOB: Jul 06, 1945, 68 y.o.   MRN: [id number]  HPI Comments: Jason Mcdaniel is a very pleasant 68 year old gentleman with aortic valve replacement for bicuspid aortic valve in 2000 with St. 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 Ysidro Mcdaniel 12/02/2011.  On his last clinic visit, he had significant left-sided flank pain. In followup today, he reports that he is doing well. He continues to have left rib pain. He frequently has to push the rib back into place to avoid sharp shooting pain. He is exercising on a regular basis. No significant symptoms of shortness of breath or chest pain with exertion. He has followup with Dr. Ysidro Mcdaniel at [facility] in April 2015. CT scan scheduled at that time Residual laryngeal problems and difficulty talking. Feels that there is sputum collecting in his throat needs to clear on a regular basis  EKG today shows normal sinus rhythm with rate of 53 beats per minute with right bundle branch block No recent lab  Outpatient Encounter Prescriptions as of 04/29/2013  Medication Sig  . amLODipine (NORVASC) 5 MG tablet Take 5 mg by mouth daily.  [name] aspirin 81 MG tablet Take 81 mg by mouth daily.  [name] Bioflavonoid Products (ESTER C PO) Take by mouth daily.  . calcium carbonate 200 MG capsule Take 250 mg by mouth 2 (two) times daily with a meal.  . carvedilol (COREG) 25 MG tablet Take 25 mg by mouth 2 (two) times daily with a meal.  . pravastatin (PRAVACHOL) 80 MG tablet Take 80 mg by mouth daily.   . Prenatal Vit-Fe Fumarate-FA (MULTIVITAMIN-PRENATAL) 27-0.8 MG TABS Take 1 tablet by mouth daily.  . vitamin B-12 (CYANOCOBALAMIN) 1000 MCG tablet Take  1,000 mcg by mouth daily.  [name] warfarin (COUMADIN) 10 MG tablet Take 10 mg by mouth daily. Takes 10 mg  On Tuesday Thursday Sat and Sun  . warfarin (COUMADIN) 5 MG tablet Take 5 mg by mouth daily. Takes Monday Wed and Friday    Review of Systems  Constitutional: Negative.   HENT: Negative.   Eyes: Negative.   Respiratory: Negative.   Cardiovascular: Negative.   Gastrointestinal: Negative.   Endocrine: Negative.   Musculoskeletal: Negative.        Flank pain at the site of thoracotomy incision  Skin: Negative.   Allergic/Immunologic: Negative.   Neurological: Negative.   Hematological: Negative.   Psychiatric/Behavioral: Negative.   All other systems reviewed and are negative.    BP 130/68  Pulse 53  Ht 5\' 10"  (1.778 m)  Wt 216 lb 4 oz (98.09 kg)  BMI 31.03 kg/m2  Physical Exam  Nursing note and vitals reviewed. Constitutional: He is oriented to person, place, and time. He appears well-developed and well-nourished.  HENT:  Head: Normocephalic.  Nose: Nose normal.  Mouth/Throat: Oropharynx is clear and moist.  Eyes: Conjunctivae are normal. Pupils are equal, round, and reactive to light.  Neck: Normal range of motion. Neck supple. No JVD present.  Cardiovascular: Normal rate, regular rhythm, S1 normal, S2 normal and intact distal pulses.  Exam reveals no gallop and no friction rub.   Murmur heard.  Systolic murmur is present with a grade of 2/6  Pulmonary/Chest: Effort normal. No respiratory distress. He has decreased breath sounds in the left lower field. He has no wheezes. He has no rales. He exhibits no tenderness.  Abdominal: Soft. Bowel sounds are normal. He exhibits no distension. There is no tenderness.  Musculoskeletal: Normal range of motion. He exhibits no edema and no tenderness.  Lymphadenopathy:    He has no cervical adenopathy.  Neurological: He is alert and oriented to person, place, and time. Coordination normal.  Skin: Skin is warm and dry. No rash noted.  No erythema.  Psychiatric: He has a normal mood and affect. His behavior is normal. Judgment and thought content normal.      Assessment and Plan

## 2013-05-20 LAB — LIPID PANEL
CHOLESTEROL: 193 mg/dL (ref 0–200)
HDL: 42 mg/dL (ref 35–70)
LDL Cholesterol: 128 mg/dL
TRIGLYCERIDES: 113 mg/dL (ref 40–160)

## 2013-05-20 LAB — BASIC METABOLIC PANEL
BUN: 21 mg/dL (ref 4–21)
CREATININE: 1.1 mg/dL (ref 0.6–1.3)
Glucose: 94 mg/dL
POTASSIUM: 4.8 mmol/L (ref 3.4–5.3)
Sodium: 142 mmol/L (ref 137–147)

## 2013-05-20 LAB — PSA: PSA: 1

## 2013-07-19 DIAGNOSIS — J3801 Paralysis of vocal cords and larynx, unilateral: Secondary | ICD-10-CM | POA: Insufficient documentation

## 2013-07-30 ENCOUNTER — Ambulatory Visit (INDEPENDENT_AMBULATORY_CARE_PROVIDER_SITE_OTHER): Payer: Medicare Other | Admitting: Cardiovascular Disease

## 2013-07-30 ENCOUNTER — Encounter: Payer: Self-pay | Admitting: Cardiovascular Disease

## 2013-07-30 VITALS — BP 122/70 | HR 52 | Ht 70.0 in | Wt 214.2 lb

## 2013-07-30 DIAGNOSIS — Z9889 Other specified postprocedural states: Secondary | ICD-10-CM

## 2013-07-30 DIAGNOSIS — Z8679 Personal history of other diseases of the circulatory system: Secondary | ICD-10-CM

## 2013-07-30 DIAGNOSIS — I10 Essential (primary) hypertension: Secondary | ICD-10-CM

## 2013-07-30 DIAGNOSIS — J383 Other diseases of vocal cords: Secondary | ICD-10-CM

## 2013-07-30 DIAGNOSIS — Z01818 Encounter for other preprocedural examination: Secondary | ICD-10-CM

## 2013-07-30 DIAGNOSIS — E785 Hyperlipidemia, unspecified: Secondary | ICD-10-CM

## 2013-07-30 NOTE — Assessment & Plan Note (Signed)
We have indicated to him that he would likely need Lovenox bridging for surgery given his mechanical valve. This was discussed with him in detail, surgery would possibly be on 08/27/2013 with preop on 08/22/2013. He would likely need to hold warfarin 5 days in advance, Lovenox bridge with 100 mg twice a day subcutaneous holding the Lovenox the morning of the procedure.  It is unclear when he should restart the Lovenox following the procedure as details of the surgery are unclear. We'll try to make contact with the surgeon. Several phone calls were made this afternoon unsuccessfully He would likely need Lovenox for 5 days following the procedure with start of warfarin

## 2013-07-30 NOTE — Assessment & Plan Note (Signed)
Blood pressure is well controlled on today's visit. No changes made to the medications. 

## 2013-07-30 NOTE — Assessment & Plan Note (Signed)
Followed by [facility] surgery, Dr. Ysidro Evert

## 2013-07-30 NOTE — Progress Notes (Signed)
Patient ID: Jason Mcdaniel, male    DOB: Aug 29, 1945, 68 y.o.   MRN: [id number]  HPI Comments: 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 Ysidro Evert 12/02/2011.  In followup today, he reports that he is doing well. He is exercising on a regular basis, swimming regularly and competitively. He requested a visit today to discuss possibly having local court surgery at [facility]. This was recommended to him given his hoarse voice. 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  Details of the surgery are unclear.   Outpatient Encounter Prescriptions as of 07/30/2013  Medication Sig  . amLODipine (NORVASC) 5 MG tablet Take 5 mg by mouth daily.  [name] aspirin 81 MG tablet Take 81 mg by mouth daily.  [name] Bioflavonoid Products (ESTER C PO) Take by mouth daily.  . calcium carbonate 200 MG capsule Take 250 mg by mouth 2 (two) times daily with a meal.  . carvedilol (COREG) 25 MG tablet Take 25 mg by mouth 2 (two) times daily with a meal.  . Coenzyme Q10 (CO Q 10 PO) Take by mouth daily.  . pravastatin (PRAVACHOL) 80 MG tablet Take 80 mg by mouth daily.   . Prenatal Vit-Fe Fumarate-FA (MULTIVITAMIN-PRENATAL) 27-0.8 MG TABS Take 1 tablet by mouth daily.  . vitamin B-12 (CYANOCOBALAMIN) 1000 MCG tablet Take 1,000 mcg by mouth daily.  [name] warfarin (COUMADIN) 10 MG tablet Take 10 mg by mouth daily. Takes 10 mg  On Tuesday Thursday Sat and Sun  . warfarin (COUMADIN) 5 MG tablet Take 5 mg by  mouth daily. Takes Monday Wed and Friday    Review of Systems  Constitutional: Negative.   HENT: Negative.        Hoarse voice  Eyes: Negative.   Respiratory: Negative.   Cardiovascular: Negative.   Gastrointestinal: Negative.   Endocrine: Negative.   Musculoskeletal: Negative.        Flank pain at the site of thoracotomy incision  Skin: Negative.   Allergic/Immunologic: Negative.   Neurological: Negative.   Hematological: Negative.   Psychiatric/Behavioral: Negative.   All other systems reviewed and are negative.   BP 122/70  Pulse 52  Ht 5\' 10"  (1.778 m)  Wt 214 lb 4 oz (97.183 kg)  BMI 30.74 kg/m2  Physical Exam  Nursing note and vitals reviewed. Constitutional: He is oriented to person, place, and time. He appears well-developed and well-nourished.  HENT:  Head: Normocephalic.  Nose: Nose normal.  Mouth/Throat: Oropharynx is clear and moist.  Eyes: Conjunctivae are normal. Pupils are equal, round, and reactive to light.  Neck: Normal range of motion. Neck supple. No JVD present.  Cardiovascular: Normal rate, regular rhythm, S1 normal, S2 normal and intact distal pulses.  Exam reveals no gallop and no friction rub.   Murmur heard.  Systolic murmur is present with a grade of 2/6  Pulmonary/Chest: Effort normal. No respiratory distress. He has decreased breath sounds in the left lower field. He has no wheezes. He has no rales. He  exhibits no tenderness.  Abdominal: Soft. Bowel sounds are normal. He exhibits no distension. There is no tenderness.  Musculoskeletal: Normal range of motion. He exhibits no edema and no tenderness.  Lymphadenopathy:    He has no cervical adenopathy.  Neurological: He is alert and oriented to person, place, and time. Coordination normal.  Skin: Skin is warm and dry. No rash noted. No erythema.  Psychiatric: He has a normal mood and affect. His behavior is normal. Judgment and thought content normal.      Assessment and Plan

## 2013-07-30 NOTE — Assessment & Plan Note (Signed)
Encouraged him to stay on his pravastatin 

## 2013-07-30 NOTE — Patient Instructions (Signed)
You are doing well. No medication changes were made.  We will call Dr. Patrice Paradise , at [facility]  Please call us if you have new issues that need to be addressed before your next appt.  Your physician wants you to follow-up in: 6 months.  You will receive a reminder letter in the mail two months in advance. If you don't receive a letter, please call our office to schedule the follow-up appointment.

## 2013-08-05 ENCOUNTER — Telehealth: Payer: Self-pay

## 2013-08-05 NOTE — Telephone Encounter (Signed)
Pt's wife called asking if Dr. Rockey Situ had spoken w/ Dr. Patrice Paradise at [facility] about pt's upcoming vocal cord surgery and blood thinners.

## 2013-08-05 NOTE — Telephone Encounter (Signed)
E-mail has been sent to [employer] Waiting for a response

## 2013-08-06 ENCOUNTER — Telehealth: Payer: Self-pay

## 2013-08-06 NOTE — Telephone Encounter (Signed)
Spoke w/ pt.  Advised him that Dr. Rockey Situ has sent e-mail and we will notify him as soon as we hear back.

## 2013-08-06 NOTE — Telephone Encounter (Signed)
Spoke w/ pt.  Advised him that per Dr. Rockey Situ, if he decides to proceed w/ surgery on his vocal chords, he would need to stop his blood thinners before surgery and resume lovenox injections 48 hrs after surgery.  Advised pt that he will be unprotected during that 48hrs and it is his decision if he would like to proceed.  He states that he is definitely proceeding w/ surgery and would like to know who will be prescribing the lovenox, our office or Dr. Towanda Malkin.  Pt surgery is sched for 10/27/13.

## 2013-08-06 NOTE — Telephone Encounter (Signed)
We can prescribe the Lovenox for him

## 2013-08-10 NOTE — Telephone Encounter (Signed)
Left message for pt to call back  °

## 2013-08-11 ENCOUNTER — Telehealth: Payer: Self-pay | Admitting: *Deleted

## 2013-08-11 NOTE — Telephone Encounter (Signed)
Patient has several questions on stopping Warfarin for his surgery. Please call

## 2013-08-11 NOTE — Telephone Encounter (Signed)
Left message for pt to call back  °

## 2013-08-11 NOTE — Telephone Encounter (Signed)
Stana Bunting, RN at 08/11/2013 12:20 PM    Status: Signed        Spoke w/ pt's wife. She has several concerns regarding pt's upcoming surgery.  Advised her to contact Dr. Maralyn Sago office for coumadin concerns, as they monitor this for pt.  Advised her that we have a coumadin clinic here and can most certainly monitor pt's levels if it would be more convenient.  She states that pt is concerned about stopping his coumadin in time for surgery and whether he will proceed w/ vocal cord sugery.  She states that has a list of questions for Dr. Caryn Section and Dr. Patrice Paradise, as well.  She will call back if we can be of further assistance.

## 2013-08-11 NOTE — Telephone Encounter (Signed)
Spoke w/ pt's wife.  She has several concerns regarding pt's upcoming surgery.   Advised her to contact Dr. Maralyn Sago office for coumadin concerns, as they monitor this for pt.  Advised her that we have a coumadin clinic here and can most certainly monitor pt's levels if it would be more convenient.  She states that pt is concerned about stopping his coumadin in time for surgery and whether he will proceed w/ vocal cord sugery.  She states that has a list of questions for Dr. Caryn Section and Dr. Patrice Paradise, as well. She will call back if we can be of further assistance.

## 2014-07-10 NOTE — Consult Note (Signed)
PATIENT NAME:  Jason Mcdaniel, Jason Mcdaniel MR#:  [id number] DATE OF BIRTH:  1945-09-22  DATE OF CONSULTATION:  08/09/2011  REFERRING PHYSICIAN:  Belva Bertin, MD   CONSULTING PHYSICIAN:  Janalee Dane, MD  HISTORY OF PRESENT ILLNESS: The patient is a 69 year old white male who was seen in the Emergency Room late last night/early this morning for right-sided epistaxis. Consultation was obtained from Dr. Tami Ribas from the ENT Service, and a right-sided Pope Pack was placed. I have reviewed the consultation, and it also indicates that consideration was given for fresh frozen plasma last night. Unfortunately, the patient began bleeding again a couple of hours ago. He presented to the Emergency Room and had a very large bloody bowel movement and was throwing up blood. The patient has had several of these episodes over the past several years and had an artificial heart valve placed at [facility] a number of years ago and then had an aortic dissection that was treated at [facility], also remotely. The patient's health is otherwise good. He walks about 3 miles every day. He last took his Coumadin yesterday. His INR was 2.4 yesterday.   Allergies, medications, past medical history, past surgical history were reviewed and documented in the chart.   PHYSICAL EXAMINATION:  GENERAL: The patient is an otherwise healthy-appearing male with blood in the oropharynx. After I suctioned the clot out of the oropharynx, there was fresh bleeding that continued posteriorly. Anteriorly, there was no active bleeding. After the patient was given 4 mg of morphine, 1 mg of Versed, an additional Pope Pack was placed. The patient did experience some bleeding anteriorly after the pack was placed, but after application of an ice pack there was no further bleeding posteriorly, no further bleeding anteriorly.   IMPRESSION: Epistaxis from the right nasal cavity. Unfortunately, the patient did bleed again after the initial pack was placed last night.  After the second pack has been placed, at this point he is not actively bleeding. However, given his INR of 2.4 and somewhat labile hypertension, he is, as I have explained to him and his family, at risk for additional bleeding. With respect to his valve and need for anticoagulation, I will defer to Internal Medicine as well as, if they feel indicated, the Cardiology Services for advice as to whether or not the patient would be a candidate for FFP. This was discussed last night, but I do not have any additional information other than it was discussed. The patient has held his Coumadin today. I would recommend holding it additionally, and I have ordered antibiotics to cover for prophylaxis from toxic shock syndrome. The patient already has scheduled an appointment to have the initial pack taken out. Both packs will be removed at the same time. It will be impossible to remove one without the  other.   ____________________________ J. Nadeen Landau, MD [id number] D: 08/09/2011 17:57:05 ET T: 08/09/2011 18:22:16 ET JOB#: [id number]  cc: Janalee Dane, MD, <Dictator> [facility] Nicholos Johns MD ELECTRONICALLY SIGNED 08/10/2011 5:31

## 2014-07-10 NOTE — H&P (Signed)
PATIENT NAME:  Jason Mcdaniel, Jason Mcdaniel MR#:  [id number] DATE OF BIRTH:  1945/05/26  DATE OF ADMISSION:  08/09/2011   REFERRING PHYSICIAN: Dr. Belva Bertin   PRIMARY CARE PHYSICIAN: Lelon Huh, MD   PRIMARY CARDIOLOGIST: Dr. Verl Blalock of [facility]   REASON FOR ADMISSION: Epistaxis.   HISTORY OF PRESENT ILLNESS: The patient is a 69 year old male with past medical history as listed below including hypertension, hyperlipidemia, history of CVA, history of aortic aneurysm with chronic type B aortic dissection, history of mechanical aortic valve replacement who is on anticoagulation who presented to the Emergency Department initially yesterday due to epistaxis. History was obtained from speaking with the patient as well as his wife who provides additional details. Around 8:30 last night while patient and his wife were at a music festival he started having some blood dripping from his right nostril. He was taken home later and wife has been instructed in the past on measures to attempt for epistaxis as the patient has had epistaxis in the past. The patient was taken back home and pressure was applied to his nostril and an ice pack was also applied. These measures were performed for about an hour and a half. Despite these measures, the patient was noted to have blood flowing down his posterior oropharynx and thereafter he was brought to the ER. He was seen by ENT yesterday and underwent nasal packing by Dr. Tami Ribas and epistaxis initially had stopped and thereafter patient was sent home. Again he went home and was noted to be spitting up blood and an excessive amount of blood flowing down his throat and he was also spitting up blood and swallowing blood. He felt very sweaty and clammy and felt as if he were going to "pass out" and thereafter EMS was called and he was brought back to the ER again. He was seen by ENT a second time and underwent nasal packing for the second time posteriorly and thereafter bleeding  stopped. He has lost a significant amount of blood. Initial hemoglobin on arrival was 11.4, now down to 8.4. He has symptomatic anemia with presyncope. He was also tachycardic. Apparently he was given a dose of oral Vitamin K last night in the ER. INR on arrival was 2.4 yesterday followed by a level of 2.3 today. At this time he has a blood clot noted in his posterior oropharynx. He has some nausea. Otherwise, he is without specific complaints at this time. Hospitalist services were contacted for further evaluation and for hospital admission.   PAST MEDICAL HISTORY:  1. Mechanical aortic valve replacement.  2. Hypertension. 3. Hyperlipidemia.  4. Cerebrovascular accident with no residual deficits other than some minor speech deficit and stuttering of his speech.  5. History of thoracic aortic aneurysm.  6. History of chronic type B aortic dissection.  7. History of mechanical aortic valve replacement. The patient is currently on anticoagulation.  8. History of MI.   PAST SURGICAL HISTORY: Surgery for aortic dissection.   ALLERGIES: Penicillin causes a rash.   HOME MEDICATIONS:  1. Toprol-XL 12.5 mg every morning.  2. Clonidine 0.1 mg every morning. 3. Amlodipine 5 mg in the morning. 4. Calcium 1000 mg in the morning. 5. Aspirin 81 mg in the morning.  6. Vitamin B12 1000 mcg in the morning.  7. Valsartan/HCTZ 30 mg/25 mg 1 tab p.o. every evening. 8. Coumadin alternating between 10 and 5 mg per day. On Sunday, Tuesday, Thursday, and Saturday he takes 10 mg and on Mondays, Wednesdays, and  Fridays he takes 5 mg.  9. Pravastatin 80 mg at bedtime.  FAMILY HISTORY: The patient's mother died at age 44 of natural causes. Father died at age 15 secondary to unspecified cardiac problems.   SOCIAL HISTORY: Negative for tobacco, alcohol, or illicit drug use. He is a retired Futures trader.   REVIEW OF SYSTEMS: CONSTITUTIONAL: Denies fevers, chills, or recent changes in weight or weakness.  Denies any pain at this time. HEAD/EYES: Denies headache or blurry vision. ENT: Reports epistaxis. Denies tinnitus or earache. Has postnasal drip as well. RESPIRATORY: Denies shortness breath, cough, or wheezing. CARDIOVASCULAR: Denies chest pain, heart palpitations, lower extremity edema. GI: Has nausea. Denies vomiting, diarrhea, constipation, or melena. Did have some blood in his stool but he thought this was secondary to swallowing excessive amounts of blood. GU: Denies dysuria or hematuria. ENDOCRINE: Denies heat or cold intolerance. HEME/LYMPH: Denies easy bruising. Has a bloody nose. INTEGUMENTARY: Denies rash or lesions. MUSCULOSKELETAL: Denies joint pain, muscle weakness, or back pain currently. NEUROLOGIC: Denies headache, numbness, weakness, tingling, or dysarthria. Did have some dizziness and lightheadedness and felt presyncopal earlier. PSYCH: Denies depression or anxiety.  PHYSICAL EXAMINATION:  VITAL SIGNS: Afebrile, pulse 88, blood pressure 109/58, respirations 14, oxygen sats 100% on room air.   GENERAL: The patient is alert and oriented not acutely distressed.   HEENT: Normocephalic, atraumatic. Pupils equal, round, and reactive to light and accommodation. Extraocular muscles are intact. Anicteric sclerae. Conjunctivae pink. Hearing intact to voice. Evidence of packing in the right naris. Oral mucosa is moist but he does have some residual blood and evidence of a blood clot in the posterior oropharynx.   NECK: Supple with full range of motion. No JVD, lymphadenopathy, or carotid bruits bilaterally. No thyromegaly or tenderness to palpation over thyroid gland.   CHEST: Normal respiratory effort without use of accessory respiratory muscles. Lungs are clear to auscultation bilaterally without crackles, rales, or wheezing.   CARDIOVASCULAR: S1, S2 positive. The patient is tachycardic. No murmurs, rubs, or gallops. PMI is non-lateralized.   ABDOMEN: Soft, nontender, nondistended.  Normoactive bowel sounds. No hepatosplenomegaly or palpable masses. No hernias.   EXTREMITIES: No clubbing, cyanosis, or edema. Pedal pulses are palpable bilaterally.   SKIN: No suspicious rashes. Skin turgor is good. He has generalized conjunctival pallor and subungual pallor.   LYMPH: No cervical lymphadenopathy.  NEUROLOGIC: Alert and oriented x3. Cranial nerves II through XII grossly intact. No focal deficits.   PSYCH: Appropriate affect.   LABORATORY, DIAGNOSTIC, AND RADIOLOGICAL DATA:  Point-of-care testing blood glucose 193. CBC within normal limits except for hemoglobin 8.4, hematocrit 26.1, MCV normal. Hemoglobin on arrival yesterday was 11.4, now down to 8.4. INR on arrival was 2.4, now down to 2.3. CMP within normal limits except for serum calcium slightly low at 8.2, BUN 35, creatinine 1.35, estimated GFR 54.   EKG normal sinus rhythm, heart rate 96 beats per minute with right bundle branch block.   ASSESSMENT AND PLAN: This is a 69 year old male with history of hypertension, hyperlipidemia, CVA, history of aortic aneurysm, chronic type B aortic dissection, mechanical aortic valve replacement who is on anticoagulation, and history of MI here with:   IMPRESSION:  1. Epistaxis requiring nasal packing x2 by ENT.  2. Acute posthemorrhagic anemia with posterior packing performed.  3. Acute posthemorrhagic anemia which is symptomatic with presyncope.  4. Presyncope due to excessive blood loss/anemia.  5. History of mechanical aortic valve replacement.  6. History of hypertension.  7. History of hyperlipidemia.  8.  History of cerebrovascular accident.  9. Mild renal insufficiency. Suspect BUN is elevated from swallowed blood.  10. Hyperglycemia, could be stress induced with no prior history of diabetes mellitus.  11. History of myocardial infarction.   PLAN:  1. Will admit patient to telemetry unit given his cardiac history. Will recommend transfusing the patient packed red blood  cells as he's symptomatic with presyncope. He is also tachycardic. Will transfuse 1 unit of PRBCs for now, then reassess his symptoms and also follow-up hemoglobin and hematocrit.  2. Suspect presyncope is due to excessive blood loss as above. Will transfuse blood as above and also monitor his hemodynamics and vital signs closely. Also check orthostatics. There are no focal neurologic deficits on physical exam, therefore, hold off on further testing of presyncope such as carotid Doppler's, head CT, or echocardiogram. He will be monitored on tele.  3. In regards to his history of MI, will hold aspirin if he has no chest pain.  4. In regards to his history of mechanical aVR, will hold Coumadin therapy for now and Cardiology is in agreement. I had a chance to speak with Dr. Caryl Comes from [facility] who also recommends holding Coumadin for now and following PT/INR and is also in agreement with blood transfusion. Would hold off on administering FFP unless absolutely indicated and at this time the patient's bleeding appears to have subsided and there is no evidence of active bleeding at this time. Will follow his PT/INR closely. Will resume Coumadin once deemed safe to do so from ENT and Cardiology standpoint. Primary team to consider Cardiology evaluation if needed and Dr. Caryl Comes recommends that primary team call formal Cardiology consult if felt to be needed. Otherwise, will watch the patient closely and monitor for further bleeding and follow hemoglobin and hematocrit closely. ENT help is greatly appreciated and will await further recommendations from Dr. Carlis Abbott. Would leave nasal packing in place for now.  5. In regards to hypertension, continue the patient's blood pressure medications. He does have mild renal insufficiency. Would use HCTZ and ARB therapy cautiously but the patient is very adamant about getting his medications the exact same way as he's been taking them at home. Therefore, will continue the  same antihypertensives and monitor blood pressure closely. Need to avoid malignant hypertension which will not help his epistaxis and can provoke further bleeding.  6. In regards to mild renal insufficiency, suspect BUN is mostly elevated from swallowed blood. His creatinine is very slightly elevated and will provide gentle hydration and reassess this in the morning.  7. In regards to hyperlipidemia, will continue statin therapy.  8. In regards to history of CVA, aspirin and Coumadin will be on hold for now given epistaxis and anemia and significant bleeding risks. Will continue statin.  9. In regards to the patient's hyperglycemia, this could be stress induced. There is no prior history of diabetes mellitus. Will check hemoglobin A1c and monitor sugars closely. Continue sliding scale insulin if needed.  10. DVT prophylaxis with SCDs and TEDs.  CODE STATUS: FULL CODE.        TIME SPENT ON THIS ADMISSION: Approximately 65 minutes.   ____________________________ Romie Jumper, MD [id number] D: 08/09/2011 20:49:11 ET T: 08/10/2011 08:05:59 ET JOB#: [id number]  cc: Romie Jumper, MD, <Dictator> [place] Caryn Section, MD [place] Verl Blalock, MD Romie Jumper MD ELECTRONICALLY SIGNED 08/26/2011 19:38

## 2014-07-10 NOTE — Consult Note (Signed)
PATIENT NAME:  Jason Mcdaniel, Jason Mcdaniel MR#:  [id number] DATE OF BIRTH:  [birth year]  DATE OF CONSULTATION:  08/09/2011  REFERRING PHYSICIAN:  Emergency Room physician, Dr. Renard Hamper. CONSULTING PHYSICIAN:  Roena Malady, MD  REASON FOR CONSULTATION: Epistaxis.   HISTORY OF PRESENT ILLNESS: This is a gentleman who has a mechanical heart valve who is Coumadin who earlier on the 23rd began to have some nose bleeding. He has had history of this in the past and required packing in the past as well. Upon arrival to the ED he was noted to have an INR of 2.4. His hemoglobin was 11.4. Multiple attempts were tried at cautery and a [employer] which were unsuccessful. He continued to have oozing. His blood pressure was also labile while in the Emergency Room and that is currently being treated. I was asked to see the patient and pack the nose.   PAST MEDICAL HISTORY: His past medical history is significant for a heart valve.   MEDICATIONS: Noted and listed in the chart and include Coumadin.   REVIEW OF SYSTEMS: Unremarkable.   FAMILY HISTORY: Noncontributory.   PHYSICAL EXAMINATION:  EARS: The external ears appeared normal.   NOSE: The anterior nose has an obvious Rhino Rocket in it. All of the bleeding has been from the right-hand side. The left nostril is widely patent.   MOUTH: The oral cavity and oropharynx does show some bloody drainage down the posterior pharynx.   NECK: Palpation of the neck is benign.   PROCEDURE: After the patient's consent, the Rhino Rocket was removed. A cottonoid pledget soaked with phenylephrine and 2% lidocaine solution was placed within the nostril for hemostasis and anesthesia. After approximately eight minutes this was removed. Examination showed excoriation on the right septum, but no evidence of obvious bleed. An anterior and posterior Merocel pack was then placed on the right-hand side. This was then infiltrated with oxymetazoline. This stopped the bleeding. There was  no evidence of posterior pharyngeal bleeding at the completion of this.   DISPOSITION: The patient was then returned back to the ER physicians for possible FFP transfusion and control of his hypertension. He will follow-up with [place] in five days for pack removal. I would discharge him on Keflex 500 p.o. b.i.d. for five days.  ____________________________ Roena Malady, MD ctm:rbg D: 08/09/2011 02:51:39 ET T: 08/09/2011 13:16:30 ET JOB#: [id number]  cc: Roena Malady, MD, <Dictator> Roena Malady MD ELECTRONICALLY SIGNED 09/11/2011 8:19

## 2014-07-10 NOTE — Discharge Summary (Signed)
PATIENT NAME:  Jason Mcdaniel, Jason Mcdaniel MR#:  [id number] DATE OF BIRTH:  [birth year]  DATE OF ADMISSION:  08/09/2011 DATE OF DISCHARGE:  08/11/2011  DISCHARGE DIAGNOSES:  1. Severe epistaxis status post posterior nasal packing.  2. Hypertension.  3. Hyperlipidemia.  4. History of mechanical aortic valve replacement.  5. History of cerebrovascular accident.  6. History of thoracic aortic aneurysm.  7. Aortic dissection.   DISCHARGE MEDICATIONS:  1. Toprol-XL 12.5 mg in the morning. 2. Clonidine 0.1 mg p.o. b.i.d.  3. Amlodipine 5 mg in the morning.  4. Calcium 1 gram daily.  5. Vitamin B12 1000 mcg in the morning. 6.  7. Valsartan/HCTZ 25 mg p.o. daily.   NEW MEDICINE: Keflex 500 mg every eight hours for seven days.   STOP THE FOLLOWING MEDICATIONS: Aspirin and Coumadin.   DIET: Low sodium diet.  FOLLOW UP:  1. Patient is to follow up with Dr. Nadeen Landau or Dr. Tami Ribas from ENT in 2 to 3 days regarding follow up of epistaxis.  2. Also follow up with cardiology, Dr. Verl Blalock, from [facility] again in 3 to 4 days to follow up regarding continuation of Coumadin or not based on his epistaxis improvement. 3. Patient's primary doctor is Dr. Caryn Section. He is advised to follow up with him again in 3 to 4 days to repeat the blood work like CBC to see if hemoglobin is coming up.   CONSULTATION: ENT consult with Dr. Nadeen Landau.    HOSPITAL COURSE: 69 year old male. He is admitted for persistent epistaxis. Look at the history and physical for full details. Patient was on Coumadin for mechanical aortic valve and he has been taking it for years. Patient did have epistaxis episodes before but never severe like this. Patient was seen in the Emergency Room the day before, on 05/23, has posterior nasal pack done, was discharged home but he went home, he continued to have epistaxis that was going down the oropharynx. Patient seen in the ER. Patient had another posterior nasal pack put in by ENT. Patient also felt  very dizzy at home and clammy and about to pass out. Hemoglobin dropped in a day from 11.4 to 8.4 so he is admitted to hospitalist service for symptomatic anemia, presyncope and he also was tachycardic. Patient received a total of 3 units of blood transfusion in the hospital stay and epistaxis has resolved. Patient's hemoglobin this morning 8.8 and hematocrit 25.2. So I spoke with Dr. Carlis Abbott who suggested that patient bleeding has stopped and he can be discharged from ENT standpoint. Patient denies any other complaints at this time and his hemoglobin is improved and epistaxis resolved so he will be followed up with ENT regarding removing the posterior nasal packs in 2 to 3 days and also I gave prescription for Keflex to prevent toxic shock syndrome. He can continue that.   Acute renal failure. Patient was dehydrated and had acute renal failure because of blood loss and his BUN and creatinine on admission 35 and 1.35. cr  elevated as well yesterday  BUN 62 and 1.53 creatinine so I stopped the HCTZ and he was continued on IV fluids, normal at 50 mL/h. This morning patient's BUN and creatinine are normal with BUN of 25, which is slightly elevated with creatinine 1.09 and GFR more than 60 so IV fluids were stopped. Advised him to continue his home medications and follow up with his primary doctor. Patient's vitals this morning: Heart rate 92, blood pressure 127/77, sats 99% on room air. Patient  breathing doctor and ENT. Advised to stop aspirin and Coumadin till see ENT and cardiology.   TIME SPENT ON DISCHARGE PREPARATION: More than 30 minutes.   ____________________________ Epifanio Lesches, MD sk:cms D: 08/11/2011 08:48:01 ET T: 08/12/2011 13:26:48 ET JOB#: [id number]  cc: Epifanio Lesches, MD, <Dictator> [place] Caryn Section, MD Epifanio Lesches MD ELECTRONICALLY SIGNED 08/20/2011 7:35

## 2014-08-09 ENCOUNTER — Telehealth: Payer: Self-pay | Admitting: *Deleted

## 2014-08-09 NOTE — Telephone Encounter (Signed)
Pt wife calling stating that Friday morning, what patient called a pinched nerve in back. Pt had to lay down for an hour,  It made his stomach hurt. But is worried, so I made pt apt for July 6th (this was our soonest)  No pains since them. He not having problems other than this.  Added him to wait list.  Please call patient or pt wife.

## 2014-08-09 NOTE — Telephone Encounter (Signed)
Pt is overdue for appt.  He was due for 6 month f/u in Nov, but has not been seen in over a year.

## 2014-09-03 ENCOUNTER — Other Ambulatory Visit: Payer: Self-pay | Admitting: Family Medicine

## 2014-09-07 ENCOUNTER — Ambulatory Visit (INDEPENDENT_AMBULATORY_CARE_PROVIDER_SITE_OTHER): Payer: Medicare Other

## 2014-09-07 DIAGNOSIS — Z952 Presence of prosthetic heart valve: Secondary | ICD-10-CM

## 2014-09-07 DIAGNOSIS — Z954 Presence of other heart-valve replacement: Secondary | ICD-10-CM | POA: Diagnosis not present

## 2014-09-07 LAB — POCT INR
INR: 3
PT: 36.2

## 2014-09-07 NOTE — Patient Instructions (Signed)
Continue Warfarin 10 mg on Monday and Friday. 5 mg on all other days. Return in 4 weeks. Call if any questions or concerns.

## 2014-09-16 ENCOUNTER — Ambulatory Visit: Payer: Medicare Other | Admitting: Cardiovascular Disease

## 2014-09-16 ENCOUNTER — Ambulatory Visit (INDEPENDENT_AMBULATORY_CARE_PROVIDER_SITE_OTHER): Payer: Medicare Other | Admitting: Cardiovascular Disease

## 2014-09-16 ENCOUNTER — Encounter: Payer: Self-pay | Admitting: Cardiovascular Disease

## 2014-09-16 VITALS — BP 140/60 | HR 53 | Ht 70.0 in | Wt 208.5 lb

## 2014-09-16 DIAGNOSIS — I10 Essential (primary) hypertension: Secondary | ICD-10-CM | POA: Diagnosis not present

## 2014-09-16 DIAGNOSIS — I7101 Dissection of ascending aorta: Secondary | ICD-10-CM

## 2014-09-16 DIAGNOSIS — Z954 Presence of other heart-valve replacement: Secondary | ICD-10-CM

## 2014-09-16 DIAGNOSIS — I712 Thoracic aortic aneurysm, without rupture: Secondary | ICD-10-CM

## 2014-09-16 DIAGNOSIS — E785 Hyperlipidemia, unspecified: Secondary | ICD-10-CM

## 2014-09-16 DIAGNOSIS — Z952 Presence of prosthetic heart valve: Secondary | ICD-10-CM

## 2014-09-16 DIAGNOSIS — M549 Dorsalgia, unspecified: Secondary | ICD-10-CM

## 2014-09-16 DIAGNOSIS — R001 Bradycardia, unspecified: Secondary | ICD-10-CM

## 2014-09-16 DIAGNOSIS — I7123 Aneurysm of the descending thoracic aorta, without rupture: Secondary | ICD-10-CM

## 2014-09-16 NOTE — Assessment & Plan Note (Signed)
Consider repeat echocardiogram on his next clinic visit

## 2014-09-16 NOTE — Assessment & Plan Note (Signed)
Encouraged him to stay on his pravastatin 

## 2014-09-16 NOTE — Assessment & Plan Note (Signed)
Blood pressure is well controlled on today's visit. No changes made to the medications. 

## 2014-09-16 NOTE — Patient Instructions (Addendum)
Jason Mcdaniel will put the order in for a CT at Jason Mcdaniel will contact you today on your cell phone with an appt Jason Mcdaniel's # [phone]  Please call us if you have new issues that need to be addressed before your next appt.  Your physician wants you to follow-up in: 6 months.  You will receive a reminder letter in the mail two months in advance. If you don't receive a letter, please call our office to schedule the follow-up appointment.

## 2014-09-16 NOTE — Assessment & Plan Note (Signed)
Asymptomatic sinus bradycardia. No medication changes made at this time

## 2014-09-16 NOTE — Progress Notes (Signed)
Patient ID: Jason Mcdaniel, male    DOB: 04-12-1945, 69 y.o.   MRN: [id number]  HPI Comments: Jason Mcdaniel is a very pleasant 69 year old gentleman with aortic valve replacement for bicuspid aortic valve in 2000 with St. Jude mechanical valve, on chronic warfarin , repair of a acute type A aortic dissection in 2003 with followup CT imaging demonstrating distal arch and proximal descending thoracic aorta aneurysm estimated at 6.5 cm with  repair at [facility]. 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 Ysidro Evert 12/02/2011. He presents today for follow-up of his aortic aneurysm  He reports that over the past month to 5 weeks he has had worsening back pain, sometimes with radiating abdominal pain Pain has been severe. He describes his pain as a "pinched nerve" though denies having any radiation or sciatic pain Pain is localized in his mid back, typically with severe symptoms is unable to move and has been laying on the floor. Wife is very concerned that symptoms seem to be similar to his prior clinical presentation before his aorta surgery for dissection. He is very concerned about the abdominal presentation as well. He denies any significant chest pain, shortness of breath. In fact at times has been exercising to a relatively high level of intensity Walks daily  Last year had vocal cord surgery and used Lovenox bridging off warfarin  EKG on today's visit shows normal sinus rhythm with rate 53 bpm, right bundle branch block    Allergies  Allergen Reactions  . Penicillins Nausea Only and Hives    Current Outpatient Prescriptions on File Prior to Visit  Medication Sig Dispense Refill  . amLODipine (NORVASC) 5 MG tablet Take 5 mg by mouth daily.    [name] aspirin 81 MG tablet Take 81 mg by mouth daily.    [name] Bioflavonoid Products (ESTER C PO) Take by mouth daily.    . calcium carbonate 200 MG capsule Take 250 mg by mouth 2 (two)  times daily with a meal.    . carvedilol (COREG) 25 MG tablet Take 25 mg by mouth 2 (two) times daily with a meal.    . Coenzyme Q10 (CO Q 10 PO) Take by mouth daily.    . pravastatin (PRAVACHOL) 80 MG tablet Take 80 mg by mouth daily.     . vitamin B-12 (CYANOCOBALAMIN) 1000 MCG tablet Take 1,000 mcg by mouth daily.    [name] warfarin (COUMADIN) 10 MG tablet TAKE ONE TO TWO TABLETS BY MOUTH AS DIRECTED BY PHYSICIAN 30 tablet 5  . warfarin (COUMADIN) 5 MG tablet Take 5 mg by mouth daily. Takes Monday Wed and Friday     No current facility-administered medications on file prior to visit.    Past Medical History  Diagnosis Date  . Hyperlipidemia, mixed   . Hypertension     unspecified  . Chest pain, unspecified   . Peripheral vascular disease   . History of aortic valve replacement     a.  St. Jude mechanical valve via right mini thoracotomy by Dr Gerrit Friends at [facility] ;  b. 02/2011 Echo EF 55-65%, Gr 2 DD, Triv AI, Mild MR.  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 History of thoracic aortic aneurysm repair 02/09/2002    straight graft replacement of ascending thoracic aorta for acute type A aortic dissection (DeBakey type II) with hemiarch distal arch anastamosis and supracoronary proximal anastamosis   .  Ascending aortic dissection 02/09/2002    straight graft replacement of ascending thoracic aorta for acute type A aortic dissection (DeBakey type II) with hemiarch distal arch anastamosis and supracoronary proximal anastamosis   . Epistaxis 09/05/2011    07/2011 - Seen by ENT - resolved.  [name] TIA (transient ischemic attack) 09/05/2011    Attributed to antihypertensive medications  . 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 AAA (abdominal aortic aneurysm)     Past Surgical History  Procedure Laterality Date  . Cardiac  catheterization    . Cardiac valve replacement    . Vascular surgery    . Abdominal aortic aneurysm repair  12/02/11    [facility]    Social History  reports that he has never smoked. He has never used smokeless tobacco. He reports that he drinks alcohol. He reports that he does not use illicit drugs.  Family History Family history is unknown by patient.   Review of Systems  Constitutional: Negative.   HENT: Negative.        Hoarse voice  Eyes: Negative.   Respiratory: Negative.   Cardiovascular: Negative.   Gastrointestinal: Negative.   Endocrine: Negative.   Musculoskeletal: Negative.        Flank pain at the site of thoracotomy incision  Skin: Negative.   Allergic/Immunologic: Negative.   Neurological: Negative.   Hematological: Negative.   Psychiatric/Behavioral: Negative.   All other systems reviewed and are negative.   BP 140/60 mmHg  Pulse 53  Ht 5\' 10"  (1.778 m)  Wt 208 lb 8 oz (94.575 kg)  BMI 29.92 kg/m2  Physical Exam  Constitutional: He is oriented to person, place, and time. He appears well-developed and well-nourished.  HENT:  Head: Normocephalic.  Nose: Nose normal.  Mouth/Throat: Oropharynx is clear and moist.  Eyes: Conjunctivae are normal. Pupils are equal, round, and reactive to light.  Neck: Normal range of motion. Neck supple. No JVD present.  Cardiovascular: Normal rate, regular rhythm, S1 normal, S2 normal and intact distal pulses.  Exam reveals no gallop and no friction rub.   Murmur heard.  Systolic murmur is present with a grade of 2/6  Pulmonary/Chest: Effort normal. No respiratory distress. He has decreased breath sounds in the left lower field. He has no wheezes. He has no rales. He exhibits no tenderness.  Abdominal: Soft. Bowel sounds are normal. He exhibits no distension. There is no tenderness.  Musculoskeletal: Normal range of motion. He exhibits no edema or tenderness.  Lymphadenopathy:    He has no cervical adenopathy.  Neurological:  He is alert and oriented to person, place, and time. Coordination normal.  Skin: Skin is warm and dry. No rash noted. No erythema.  Psychiatric: He has a normal mood and affect. His behavior is normal. Judgment and thought content normal.      Assessment and Plan   Nursing note and vitals reviewed.

## 2014-09-16 NOTE — Assessment & Plan Note (Signed)
Case discussed with the vascular surgery cane at [facility]. Given his concerning symptoms, CT scan of chest and abdomen will be ordered. We will review this next week when it is available

## 2014-09-21 ENCOUNTER — Ambulatory Visit: Payer: Medicare Other | Admitting: Cardiovascular Disease

## 2014-09-22 ENCOUNTER — Telehealth: Payer: Self-pay | Admitting: *Deleted

## 2014-09-22 NOTE — Telephone Encounter (Signed)
Pt wife calling asking who will be giving them the CT results. They did it at [facility] and that doctor who ordered it off this week. And dr Rockey Situ said he would read it.  They just want to know when they will hear about this and from who Please advise.

## 2014-09-22 NOTE — Telephone Encounter (Signed)
Spoke w/ pt.  Advised him that ordering doctor will go over results w/ him. Once he has received results and they have been released, Dr. Rockey Situ will be able to go over them in more detail if he would like.  He is appreciative and will call [facility].

## 2014-10-05 ENCOUNTER — Ambulatory Visit (INDEPENDENT_AMBULATORY_CARE_PROVIDER_SITE_OTHER): Payer: Medicare Other

## 2014-10-05 DIAGNOSIS — Z954 Presence of other heart-valve replacement: Secondary | ICD-10-CM | POA: Diagnosis not present

## 2014-10-05 DIAGNOSIS — Z9889 Other specified postprocedural states: Secondary | ICD-10-CM | POA: Insufficient documentation

## 2014-10-05 DIAGNOSIS — Z952 Presence of prosthetic heart valve: Secondary | ICD-10-CM

## 2014-10-05 LAB — POCT INR
INR: 3.2
PT: 38.5

## 2014-10-05 NOTE — Patient Instructions (Signed)
INR 3.2   Continue same dose Coumadin 5 mg Monday and Friday, and 10 mg the other days. Come back in 4 weeks. Call if any questions or concerns.

## 2014-11-02 ENCOUNTER — Ambulatory Visit (INDEPENDENT_AMBULATORY_CARE_PROVIDER_SITE_OTHER): Payer: Medicare Other

## 2014-11-02 DIAGNOSIS — Z952 Presence of prosthetic heart valve: Secondary | ICD-10-CM

## 2014-11-02 DIAGNOSIS — Z954 Presence of other heart-valve replacement: Secondary | ICD-10-CM | POA: Diagnosis not present

## 2014-11-02 LAB — POCT INR
INR: 2.6
PT: 30.6

## 2014-11-02 NOTE — Patient Instructions (Signed)
Continue same dose. Coumadin 10 mg daily except 5 mg Monday and Friday. Call if any questions or concerns. Come back in 4 weeks.

## 2014-11-30 ENCOUNTER — Ambulatory Visit: Payer: Medicare Other

## 2014-12-07 ENCOUNTER — Ambulatory Visit (INDEPENDENT_AMBULATORY_CARE_PROVIDER_SITE_OTHER): Payer: Medicare Other

## 2014-12-07 DIAGNOSIS — Z954 Presence of other heart-valve replacement: Secondary | ICD-10-CM | POA: Diagnosis not present

## 2014-12-07 LAB — POCT INR
INR: 4.4
PT: 52.8

## 2014-12-07 NOTE — Patient Instructions (Signed)
Hold for 3 days and then take 5 mg on Mondays, Wednesdays, and Fridays, and then 10 mg the other days. Come back in 2 weeks. Call if any questions or concerns.

## 2014-12-20 ENCOUNTER — Ambulatory Visit (INDEPENDENT_AMBULATORY_CARE_PROVIDER_SITE_OTHER): Payer: Medicare Other | Admitting: Family Medicine

## 2014-12-20 ENCOUNTER — Encounter: Payer: Self-pay | Admitting: Family Medicine

## 2014-12-20 VITALS — BP 140/56 | HR 51 | Temp 98.0°F | Resp 18 | Wt 210.0 lb

## 2014-12-20 DIAGNOSIS — Z23 Encounter for immunization: Secondary | ICD-10-CM | POA: Diagnosis not present

## 2014-12-20 DIAGNOSIS — Z9889 Other specified postprocedural states: Secondary | ICD-10-CM

## 2014-12-20 DIAGNOSIS — M109 Gout, unspecified: Secondary | ICD-10-CM | POA: Insufficient documentation

## 2014-12-20 DIAGNOSIS — I712 Thoracic aortic aneurysm, without rupture: Secondary | ICD-10-CM

## 2014-12-20 DIAGNOSIS — Z954 Presence of other heart-valve replacement: Secondary | ICD-10-CM | POA: Diagnosis not present

## 2014-12-20 DIAGNOSIS — R51 Headache: Secondary | ICD-10-CM

## 2014-12-20 DIAGNOSIS — Z952 Presence of prosthetic heart valve: Secondary | ICD-10-CM

## 2014-12-20 DIAGNOSIS — R519 Headache, unspecified: Secondary | ICD-10-CM

## 2014-12-20 DIAGNOSIS — N529 Male erectile dysfunction, unspecified: Secondary | ICD-10-CM | POA: Insufficient documentation

## 2014-12-20 DIAGNOSIS — D689 Coagulation defect, unspecified: Secondary | ICD-10-CM | POA: Insufficient documentation

## 2014-12-20 DIAGNOSIS — I7101 Dissection of ascending aorta: Secondary | ICD-10-CM

## 2014-12-20 DIAGNOSIS — H5210 Myopia, unspecified eye: Secondary | ICD-10-CM | POA: Insufficient documentation

## 2014-12-20 DIAGNOSIS — R202 Paresthesia of skin: Secondary | ICD-10-CM

## 2014-12-20 DIAGNOSIS — I080 Rheumatic disorders of both mitral and aortic valves: Secondary | ICD-10-CM | POA: Insufficient documentation

## 2014-12-20 DIAGNOSIS — R200 Anesthesia of skin: Secondary | ICD-10-CM

## 2014-12-20 DIAGNOSIS — R42 Dizziness and giddiness: Secondary | ICD-10-CM

## 2014-12-20 DIAGNOSIS — Z87438 Personal history of other diseases of male genital organs: Secondary | ICD-10-CM | POA: Insufficient documentation

## 2014-12-20 DIAGNOSIS — I7123 Aneurysm of the descending thoracic aorta, without rupture: Secondary | ICD-10-CM

## 2014-12-20 DIAGNOSIS — D649 Anemia, unspecified: Secondary | ICD-10-CM | POA: Insufficient documentation

## 2014-12-20 DIAGNOSIS — R569 Unspecified convulsions: Secondary | ICD-10-CM | POA: Insufficient documentation

## 2014-12-20 DIAGNOSIS — M722 Plantar fascial fibromatosis: Secondary | ICD-10-CM | POA: Insufficient documentation

## 2014-12-20 DIAGNOSIS — I71019 Dissection of thoracic aorta, unspecified: Secondary | ICD-10-CM | POA: Insufficient documentation

## 2014-12-20 DIAGNOSIS — H538 Other visual disturbances: Secondary | ICD-10-CM | POA: Diagnosis not present

## 2014-12-20 LAB — POCT INR
INR: 2.6
PT: 31

## 2014-12-20 NOTE — Progress Notes (Signed)
Patient: CRYSTIAN FRITH Male    DOB: Jan 01, 1946   69 y.o.   MRN: [id number] Visit Date: 12/20/2014  Today's Provider: Lelon Huh, MD   Chief Complaint  Patient presents with  . Dizziness    x 10 days   Subjective:    Dizziness This is a new problem. Episode onset: started 10 days ago. The problem occurs intermittently. The problem has been gradually worsening (occuring more frequently). Associated symptoms include headaches (located at the top of his head). Pertinent negatives include no abdominal pain, anorexia, arthralgias, change in bowel habit, chills, congestion, diaphoresis, fatigue, fever, joint swelling, myalgias, nausea, numbness, sore throat, swollen glands, urinary symptoms, vertigo, visual change, vomiting or weakness. Cough: patient states he always has a cough  Neck pain: stiffness in the neck. Nothing aggravates the symptoms. Treatments tried: standing still. The treatment provided mild relief.  Patient describes the dizziness as a feeling of being off balance.  Was in [place] hiking the first 2 weeks of September. Started having dizzy spells 10 days ago. Episodes occur out of the blue, often when sitting, sometimes when standing. Associated with blurry vision and headache at the top of his head. Headache and dizziness resolve a few minutes after dizziness subsides. Dizziness lasts 3-4 minutes. Each episodes follow a very consistent pattern with no triggering factors. He feels well at this time.      Allergies  Allergen Reactions  . Penicillins Nausea Only and Hives   Previous Medications   AMLODIPINE (NORVASC) 5 MG TABLET    Take 5 mg by mouth daily.   ASPIRIN 81 MG TABLET    Take 81 mg by mouth daily.   BIOFLAVONOID PRODUCTS (ESTER C PO)    Take by mouth daily.   CALCIUM CARBONATE 200 MG CAPSULE    Take 250 mg by mouth 2 (two) times daily with a meal.   CARVEDILOL (COREG) 25 MG TABLET    Take 25 mg by mouth 2 (two) times daily with a meal.   COENZYME Q10  (CO Q 10 PO)    Take by mouth daily.   COLCHICINE (COLCRYS) 0.6 MG TABLET    Take 0.6 mg by mouth daily. Take 2 tablets once, take an additional tablet after 1 hour if needed   FUROSEMIDE (LASIX) 40 MG TABLET    Take 1 tablet by mouth daily.   HYDROCODONE-ACETAMINOPHEN (NORCO) 7.5-325 MG TABLET    Take 1 tablet by mouth at bedtime.   LISINOPRIL (PRINIVIL,ZESTRIL) 5 MG TABLET    Take 1 tablet by mouth daily.   METOPROLOL (LOPRESSOR) 50 MG TABLET    Take 12.5 mg by mouth 2 (two) times daily.   MULTIPLE VITAMINS-MINERALS (CORAL CALCIUM PLUS) CAPS    Take 1 tablet by mouth daily.   POTASSIUM CHLORIDE (MICRO-K) 10 MEQ CR CAPSULE    Take 2 capsules by mouth daily.   PRAVASTATIN (PRAVACHOL) 80 MG TABLET    Take 80 mg by mouth daily.    VITAMIN B-12 (CYANOCOBALAMIN) 1000 MCG TABLET    Take 1,000 mcg by mouth daily.   WARFARIN (COUMADIN) 10 MG TABLET    TAKE ONE TO TWO TABLETS BY MOUTH AS DIRECTED BY PHYSICIAN   WARFARIN (COUMADIN) 10 MG TABLET    Take 1-2 tablets by mouth daily. As directed by physician   WARFARIN (COUMADIN) 5 MG TABLET    Take 5 mg by mouth daily. Takes Monday Wed and Friday    Review of Systems  Constitutional: Negative  for fever, chills, diaphoresis and fatigue.  HENT: Negative for congestion, ear pain, postnasal drip, rhinorrhea, sinus pressure, sneezing and sore throat.   Respiratory: Negative for shortness of breath. Cough: patient states he always has a cough    Cardiovascular: Negative for leg swelling.  Gastrointestinal: Negative for nausea, vomiting, abdominal pain, anorexia and change in bowel habit.  Musculoskeletal: Negative for myalgias, joint swelling and arthralgias. Neck pain: stiffness in the neck.  Neurological: Positive for dizziness, light-headedness (off balance) and headaches (located at the top of his head). Negative for vertigo, weakness and numbness.   Patient Active Problem List   Diagnosis Date Noted  . Convulsions ([place]) 12/20/2014  . H/O abdominal  aortic aneurysm repair 10/05/2014  . 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 Vocal cord dysfunction 04/29/2013  . Left flank pain 10/23/2012  . Headache(784.0) 02/03/2012  . 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 Aortic heart valve narrowing 12/01/2011  . Cardiac murmur 12/01/2011  . Cerebrovascular accident, old 12/01/2011  . BP (high blood pressure) 12/01/2011  . Dissection of aorta, thoracic ([place]) 09/23/2011  . Thoracic aneurysm without mention of rupture 09/23/2011  . History of thoracic aortic aneurysm repair 09/23/2011  . Epistaxis 09/05/2011  . TIA (transient ischemic attack) 09/05/2011  . Descending thoracic aortic aneurysm ([place]) 09/05/2011  . 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 [place] Horner syndrome 08/17/2011  . History of aortic valve replacement 03/06/2011  . Sinus bradycardia 03/06/2011  . Right bundle branch block 03/06/2011  . Hyperlipidemia 08/11/2008  . Essential hypertension 08/11/2008  . Descending thoracic aortic dissection ([place]) 04/25/2006  . Ascending aortic dissection ([place]) 02/09/2002   Past Medical History  Diagnosis Date  . Hyperlipidemia, mixed   . Hypertension     unspecified  . Chest pain, unspecified   . Peripheral vascular disease ([place])   . History of aortic valve replacement     a.  St. Jude mechanical valve via right mini thoracotomy by Dr Gerrit Friends at [facility] ;  b. 02/2011 Echo EF 55-65%, Gr 2 DD, Triv AI, Mild MR.  [name] Descending thoracic aortic dissection ([place]) 04/25/2006    Acute Type B aortic dissection   . History of thoracic aortic aneurysm repair 02/09/2002    straight graft replacement of ascending thoracic aorta for acute type A aortic dissection (DeBakey type II) with hemiarch distal arch anastamosis and supracoronary proximal anastamosis   . Ascending aortic dissection ([place]) 02/09/2002    straight graft replacement of ascending thoracic aorta for acute type A aortic dissection (DeBakey type II) with  hemiarch distal arch anastamosis and supracoronary proximal anastamosis   . Epistaxis 09/05/2011    07/2011 - Seen by ENT - resolved.  [name] TIA (transient ischemic attack) 09/05/2011    Attributed to antihypertensive medications  . Descending thoracic aortic aneurysm [facility]) 09/05/2011    08/2011 CT: Interval enlargement of the associated descending thoracic aortic    . 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 AAA (abdominal aortic aneurysm) [facility])     Social History  Substance Use Topics  . Smoking status: Never Smoker   . Smokeless tobacco: Never Used  . Alcohol Use: 0.0 oz/week    0 Standard drinks or equivalent per week     Comment: occasional   Objective:   BP 140/56 mmHg  Pulse 51  Temp(Src) 98 F (36.7 C) (Oral)  Resp 18  Wt 210 lb (95.255 kg)  SpO2 96%  Physical Exam  General appearance: alert, well developed, well nourished, cooperative  and in no distress Head: Normocephalic, without obvious abnormality, atraumatic Lungs: Respirations even and unlabored Extremities: No gross deformities Skin: Skin color, texture, turgor normal. No rashes seen  Psych: Appropriate mood and affect. Neurologic: Mental status: Alert, oriented to person, place, and time, thought content appropriate.     Assessment & Plan:     1. Nonintractable episodic headache, unspecified headache type Assoicated with multiple neurologic symptoms as below. Considering his extensive history of vascular disease we need to rule out CNS vascular anomalies.  Go to ER if any new symptoms develop or if episodes change in character, duration, or intensity.  - CT Angio Head W/Cm &/Or Wo Cm; Future  2. Mechanical heart valve present Therapeutic INR. Continue current dose of warfarin - POCT INR   3. Blurry vision, bilateral Occurs in conjunction with headaches and dizziness.    4. Numbness and tingling in left hand    5. Dizziness Occurs in conjunction with headaches and blurry vision.    6. Descending thoracic  aortic aneurysm ([place])   7. Ascending aortic dissection ([place])   8. H/O abdominal aortic aneurysm repair   9. Need for influenza vaccination  - Flu vaccine HIGH DOSE PF        Lelon Huh, MD  [facility]

## 2014-12-20 NOTE — Patient Instructions (Signed)
No change. Continue 5mg  on M,W,F and 10mg  all other days. Recheck in 4 weeks

## 2014-12-21 ENCOUNTER — Ambulatory Visit: Payer: Self-pay

## 2014-12-22 ENCOUNTER — Ambulatory Visit: Admission: RE | Admit: 2014-12-22 | Payer: Medicare Other | Source: Ambulatory Visit

## 2014-12-23 ENCOUNTER — Telehealth: Payer: Self-pay | Admitting: *Deleted

## 2014-12-23 ENCOUNTER — Ambulatory Visit
Admission: RE | Admit: 2014-12-23 | Discharge: 2014-12-23 | Disposition: A | Discharge status: Home / Self Care | Payer: Medicare Other | Source: Ambulatory Visit | Attending: Family Medicine | Admitting: Family Medicine

## 2014-12-23 ENCOUNTER — Other Ambulatory Visit: Payer: Self-pay | Admitting: Family Medicine

## 2014-12-23 DIAGNOSIS — H538 Other visual disturbances: Secondary | ICD-10-CM | POA: Insufficient documentation

## 2014-12-23 DIAGNOSIS — I726 Aneurysm of vertebral artery: Secondary | ICD-10-CM

## 2014-12-23 DIAGNOSIS — R42 Dizziness and giddiness: Secondary | ICD-10-CM | POA: Diagnosis present

## 2014-12-23 DIAGNOSIS — R51 Headache: Secondary | ICD-10-CM | POA: Insufficient documentation

## 2014-12-23 DIAGNOSIS — R938 Abnormal findings on diagnostic imaging of other specified body structures: Secondary | ICD-10-CM | POA: Insufficient documentation

## 2014-12-23 DIAGNOSIS — R202 Paresthesia of skin: Secondary | ICD-10-CM

## 2014-12-23 DIAGNOSIS — R519 Headache, unspecified: Secondary | ICD-10-CM

## 2014-12-23 DIAGNOSIS — R200 Anesthesia of skin: Secondary | ICD-10-CM

## 2014-12-23 DIAGNOSIS — Z952 Presence of prosthetic heart valve: Secondary | ICD-10-CM

## 2014-12-23 MED ORDER — IOHEXOL 350 MG/ML SOLN
100.0000 mL | Freq: Once | INTRAVENOUS | Status: AC | PRN
  Administered 2014-12-23: 100 mL via INTRAVENOUS

## 2014-12-23 NOTE — Telephone Encounter (Signed)
Reviewed case with Dr. Lucky Cowboy who agreed to see patient next week. Advised patient that referral is in progress. In the meantime he is to increase amlodipine to 2 x 5mg  daily and continue other medications unchanged.   Please set up referral to Dr. Lucky Cowboy who is already aware of case. Thanks.

## 2014-12-23 NOTE — Telephone Encounter (Signed)
Patient notified of results. Patient expressed understanding.  

## 2014-12-23 NOTE — Telephone Encounter (Signed)
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 The risk of rupture is extremely low, but it may be responsible for headaches and blurry vision  I'll be getting in touch with vascular surgeon to see if it is something they need to evaluate, or if it would be better to see a neurologist and will get back to him next week.

## 2014-12-23 NOTE — Telephone Encounter (Signed)
Patient would like a copy of his results sent to Dr. Ysidro Evert at [facility]. Also. Pt requested that if he is referred to anyone else, he would like it to be at [facility].

## 2015-01-17 ENCOUNTER — Ambulatory Visit (INDEPENDENT_AMBULATORY_CARE_PROVIDER_SITE_OTHER): Payer: Medicare Other

## 2015-01-17 DIAGNOSIS — Z954 Presence of other heart-valve replacement: Secondary | ICD-10-CM | POA: Diagnosis not present

## 2015-01-17 DIAGNOSIS — Z952 Presence of prosthetic heart valve: Secondary | ICD-10-CM

## 2015-01-17 LAB — POCT INR
INR: 3.3
PT: 39.7

## 2015-01-17 NOTE — Patient Instructions (Signed)
cont. current dose of 5 mg 3 days and 10 mg 4 days

## 2015-01-18 ENCOUNTER — Ambulatory Visit: Payer: Medicare Other

## 2015-02-15 ENCOUNTER — Ambulatory Visit (INDEPENDENT_AMBULATORY_CARE_PROVIDER_SITE_OTHER): Payer: Medicare Other

## 2015-02-15 DIAGNOSIS — Z952 Presence of prosthetic heart valve: Secondary | ICD-10-CM

## 2015-02-15 DIAGNOSIS — Z954 Presence of other heart-valve replacement: Secondary | ICD-10-CM

## 2015-02-15 LAB — POCT INR
INR: 2.7
PT: 32.3

## 2015-02-15 NOTE — Patient Instructions (Signed)
Continue 10 mg Monday, Wednesday and Fridays. 5 mg on all other days. Call if any questions or concerns. Return in 4 weeks.

## 2015-02-15 NOTE — Progress Notes (Signed)
Patient ID: Jason Mcdaniel, male   DOB: 05-16-1945, 69 y.o.   MRN: [id number] Anticoagulation Dose Instructions as of 02/15/2015      Dorene Grebe Tue Wed Thu Fri Sat   New Dose 5 mg 10 mg 5 mg 10 mg 5 mg 10 mg 5 mg    Description        Continue 10 mg Monday, Wednesday and Fridays. 5 mg on all other days. Call if any questions or concerns. Return in 4 weeks.

## 2015-03-15 ENCOUNTER — Ambulatory Visit (INDEPENDENT_AMBULATORY_CARE_PROVIDER_SITE_OTHER): Payer: Medicare Other

## 2015-03-15 DIAGNOSIS — Z954 Presence of other heart-valve replacement: Secondary | ICD-10-CM | POA: Diagnosis not present

## 2015-03-15 DIAGNOSIS — Z952 Presence of prosthetic heart valve: Secondary | ICD-10-CM

## 2015-03-15 LAB — POCT INR
INR: 2.8
PT: 33.5

## 2015-03-15 NOTE — Patient Instructions (Signed)
Anticoagulation Dose Instructions as of 03/15/2015      Jason Mcdaniel Tue Wed Thu Fri Sat   New Dose 5 mg 10 mg 5 mg 10 mg 5 mg 10 mg 5 mg    Description        Continue 10 mg Monday, Wednesday and Fridays. 5 mg on all other days. Call if any questions or concerns. Return in 4 weeks.

## 2015-03-27 ENCOUNTER — Telehealth: Payer: Self-pay | Admitting: Cardiovascular Disease

## 2015-03-27 NOTE — Telephone Encounter (Signed)
3 attempts to schedule fu from recall list.   Deleting recall.  03/27/15 [facility]

## 2015-03-29 ENCOUNTER — Other Ambulatory Visit: Payer: Self-pay | Admitting: Family Medicine

## 2015-04-12 ENCOUNTER — Ambulatory Visit (INDEPENDENT_AMBULATORY_CARE_PROVIDER_SITE_OTHER): Payer: Medicare HMO

## 2015-04-12 DIAGNOSIS — Z954 Presence of other heart-valve replacement: Secondary | ICD-10-CM | POA: Diagnosis not present

## 2015-04-12 DIAGNOSIS — Z952 Presence of prosthetic heart valve: Secondary | ICD-10-CM

## 2015-04-12 LAB — POCT INR
INR: 2.5
PT: 29.8

## 2015-04-12 NOTE — Patient Instructions (Signed)
Anticoagulation Dose Instructions as of 04/12/2015      Jason Mcdaniel Tue Wed Thu Fri Sat   New Dose 5 mg 10 mg 5 mg 10 mg 5 mg 10 mg 5 mg    Description        Continue 10 mg Monday, Wednesday and Fridays. 5 mg on all other days. Call if any questions or concerns. Return in 4 weeks.

## 2015-05-03 ENCOUNTER — Other Ambulatory Visit: Payer: Self-pay | Admitting: Family Medicine

## 2015-05-10 ENCOUNTER — Ambulatory Visit: Payer: Medicare HMO

## 2015-05-17 ENCOUNTER — Ambulatory Visit (INDEPENDENT_AMBULATORY_CARE_PROVIDER_SITE_OTHER): Payer: Medicare HMO

## 2015-05-17 DIAGNOSIS — Z954 Presence of other heart-valve replacement: Secondary | ICD-10-CM

## 2015-05-17 DIAGNOSIS — Z952 Presence of prosthetic heart valve: Secondary | ICD-10-CM

## 2015-05-17 LAB — POCT INR
INR: 3.1
PT: 37.1

## 2015-05-17 NOTE — Patient Instructions (Signed)
Anticoagulation Dose Instructions as of 05/17/2015      Jason Mcdaniel Tue Wed Thu Fri Sat   New Dose 5 mg 10 mg 5 mg 10 mg 5 mg 10 mg 5 mg    Description        Continue 10 mg Monday, Wednesday and Fridays. 5 mg on all other days. Call if any questions or concerns. Return in 4 weeks.

## 2015-05-30 ENCOUNTER — Other Ambulatory Visit: Payer: Self-pay | Admitting: Family Medicine

## 2015-06-14 ENCOUNTER — Ambulatory Visit (INDEPENDENT_AMBULATORY_CARE_PROVIDER_SITE_OTHER): Payer: Medicare HMO

## 2015-06-14 DIAGNOSIS — Z954 Presence of other heart-valve replacement: Secondary | ICD-10-CM | POA: Diagnosis not present

## 2015-06-14 DIAGNOSIS — Z952 Presence of prosthetic heart valve: Secondary | ICD-10-CM

## 2015-06-14 LAB — POCT INR
INR: 3.3
PT: 39.8

## 2015-06-14 NOTE — Patient Instructions (Signed)
Anticoagulation Dose Instructions as of 06/14/2015      Dorene Grebe Tue Wed Thu Fri Sat   New Dose 5 mg 10 mg 5 mg 10 mg 5 mg 10 mg 5 mg    Description        Continue 10 mg Monday, Wednesday and Fridays. 5 mg on all other days. Call if any questions or concerns. Return in 4 weeks.

## 2015-07-07 ENCOUNTER — Ambulatory Visit (INDEPENDENT_AMBULATORY_CARE_PROVIDER_SITE_OTHER): Payer: Medicare HMO

## 2015-07-07 DIAGNOSIS — Z954 Presence of other heart-valve replacement: Secondary | ICD-10-CM

## 2015-07-07 DIAGNOSIS — Z952 Presence of prosthetic heart valve: Secondary | ICD-10-CM

## 2015-07-07 LAB — POCT INR
INR: 2.2
PT: 25.8

## 2015-07-07 NOTE — Patient Instructions (Signed)
Anticoagulation Dose Instructions as of 07/07/2015      Jason Mcdaniel Tue Wed Thu Fri Sat   New Dose 5 mg 10 mg 5 mg 10 mg 5 mg 10 mg 5 mg    Description        5 mg daily except 10 mg M-W-F, F/U 3 weeks

## 2015-07-12 ENCOUNTER — Ambulatory Visit: Payer: Medicare HMO

## 2015-07-26 DIAGNOSIS — H2512 Age-related nuclear cataract, left eye: Secondary | ICD-10-CM | POA: Diagnosis not present

## 2015-07-27 ENCOUNTER — Ambulatory Visit (INDEPENDENT_AMBULATORY_CARE_PROVIDER_SITE_OTHER): Payer: Medicare HMO | Admitting: Family Medicine

## 2015-07-27 DIAGNOSIS — Z954 Presence of other heart-valve replacement: Secondary | ICD-10-CM | POA: Diagnosis not present

## 2015-07-27 DIAGNOSIS — Z952 Presence of prosthetic heart valve: Secondary | ICD-10-CM

## 2015-07-27 LAB — POCT INR
INR: 4.7
PT: 56.9

## 2015-07-27 NOTE — Patient Instructions (Signed)
Anticoagulation Dose Instructions as of 07/27/2015      Jason Mcdaniel Tue Wed Thu Fri Sat   New Dose 5 mg 10 mg 5 mg 10 mg 5 mg 10 mg 5 mg    Description        Hold for 2 days, then resume 5 mg daily except 10 mg M-F, F/U 2 weeks

## 2015-07-28 ENCOUNTER — Ambulatory Visit: Payer: Self-pay

## 2015-08-09 ENCOUNTER — Encounter: Payer: Self-pay | Admitting: Family Medicine

## 2015-08-09 ENCOUNTER — Ambulatory Visit (INDEPENDENT_AMBULATORY_CARE_PROVIDER_SITE_OTHER): Payer: Managed Care, Other (non HMO) | Admitting: Family Medicine

## 2015-08-09 VITALS — BP 124/60 | HR 54 | Temp 98.0°F | Resp 16 | Wt 213.0 lb

## 2015-08-09 DIAGNOSIS — I10 Essential (primary) hypertension: Secondary | ICD-10-CM | POA: Diagnosis not present

## 2015-08-09 DIAGNOSIS — M25551 Pain in right hip: Secondary | ICD-10-CM | POA: Diagnosis not present

## 2015-08-09 DIAGNOSIS — D689 Coagulation defect, unspecified: Secondary | ICD-10-CM | POA: Diagnosis not present

## 2015-08-09 DIAGNOSIS — Z0000 Encounter for general adult medical examination without abnormal findings: Secondary | ICD-10-CM

## 2015-08-09 DIAGNOSIS — D649 Anemia, unspecified: Secondary | ICD-10-CM

## 2015-08-09 DIAGNOSIS — Z125 Encounter for screening for malignant neoplasm of prostate: Secondary | ICD-10-CM | POA: Diagnosis not present

## 2015-08-09 NOTE — Progress Notes (Signed)
Patient: Jason Mcdaniel, Male    DOB: 24-Nov-1945, 70 y.o.   MRN: [id number] Visit Date: 08/09/2015  Today's Provider: Lelon Huh, MD   Chief Complaint  Patient presents with  . Annual Exam  . Hypertension  . Coronary Artery Disease  . Hyperlipidemia   Subjective:    Annual wellness visit Jason Mcdaniel is a 70 y.o. male. He feels well. He reports exercising yes/waking. He reports he is sleeping well.  -----------------------------------------------------------  Follow-up CAD from 05/19/2013; no changes, continue to follow-up with Dr. Rockey Situ who he sees yearly  He continues to follow up with Dr. Marjory Sneddon, vascular surgery at [facility]. Was last seen October 2016 and plan was for follow up vascular imaging after 18 months. Patient has had had no chest or abdominal pains.     Hypertension, follow-up:  BP Readings from Last 3 Encounters:  08/09/15 124/60  12/20/14 140/56  09/16/14 140/60    He was last seen for hypertension 14  months ago.  BP at that visit was 150/72. Management since that visit includes; no changes, continue current medications.He reports good compliance with treatment. He is not having side effects. none  He is exercising. He is adherent to low salt diet.   Outside blood pressures are 135/80. He is experiencing none.  Patient denies none.   Cardiovascular risk factors include none.  Use of agents associated with hypertension: none.   ----------------------------------------------------------------------     Lipid/Cholesterol, Follow-up:   Last seen for this 14  months ago.  Management since that visit includes; no changes, continue pravastatin 80 mg qd.  Last Lipid Panel:    Component Value Date/Time   CHOL 193 05/20/2013   CHOL 157 11/06/2012 0849   TRIG 113 05/20/2013   HDL 42 05/20/2013   HDL 39* 11/06/2012 0849   CHOLHDL 4.0 11/06/2012 0849   LDLCALC 128 05/20/2013   LDLCALC 101* 11/06/2012 0849    He reports good  compliance with treatment. He is not having side effects. none  Wt Readings from Last 3 Encounters:  08/09/15 213 lb (96.616 kg)  12/20/14 210 lb (95.255 kg)  09/16/14 208 lb 8 oz (94.575 kg)    ---------------------------------------------------------------------- Right hip pain States that he wakes up early in morning every day with pain in right hip. He is very active and has no pain during the day when he is walking or hiking. He does not take any pain medications and cannot take NSAIDs due to being on anticoagulants.     Review of Systems  Constitutional: Negative for fever, chills, appetite change and fatigue.  HENT: Negative for congestion, ear pain, hearing loss, nosebleeds and trouble swallowing.   Eyes: Negative for pain and visual disturbance.  Respiratory: Negative for cough, chest tightness and shortness of breath.   Cardiovascular: Negative for chest pain, palpitations and leg swelling.  Gastrointestinal: Negative for nausea, vomiting, abdominal pain, diarrhea, constipation and blood in stool.  Endocrine: Negative for polydipsia, polyphagia and polyuria.  Genitourinary: Negative for dysuria and flank pain.  Musculoskeletal: Positive for arthralgias (right hip). Negative for myalgias, back pain, joint swelling and neck stiffness.  Skin: Negative for color change, rash and wound.  Neurological: Negative for dizziness, tremors, seizures, speech difficulty, weakness, light-headedness and headaches.  Psychiatric/Behavioral: Negative for behavioral problems, confusion, sleep disturbance, dysphoric mood and decreased concentration. The patient is not nervous/anxious.     Social History   Social History  . Marital Status: Single    Spouse Name: N/A  .  Number of Children: 2  . Years of Education: N/A   Occupational History  . Retired    Social History Main Topics  . Smoking status: Never Smoker   . Smokeless tobacco: Never Used  . Alcohol Use: 0.0 oz/week    0  Standard drinks or equivalent per week     Comment: occasional  . Drug Use: No  . Sexual Activity: Not on file   Other Topics Concern  . Not on file   Social History Narrative    Past Medical History  Diagnosis Date  . Hyperlipidemia, mixed   . Hypertension     unspecified  . Chest pain, unspecified   . Peripheral vascular disease ([place])   . History of aortic valve replacement     a.  St. Jude mechanical valve via right mini thoracotomy by Dr Gerrit Friends at [facility] ;  b. 02/2011 Echo EF 55-65%, Gr 2 DD, Triv AI, Mild MR.  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 History of thoracic aortic aneurysm repair 02/09/2002    straight graft replacement of ascending thoracic aorta for acute type A aortic dissection (DeBakey type II) with hemiarch distal arch anastamosis and supracoronary proximal anastamosis   . Ascending aortic dissection ([place]) 02/09/2002    straight graft replacement of ascending thoracic aorta for acute type A aortic dissection (DeBakey type II) with hemiarch distal arch anastamosis and supracoronary proximal anastamosis   . Epistaxis 09/05/2011    07/2011 - Seen by ENT - resolved.  [name] TIA (transient ischemic attack) 09/05/2011    Attributed to antihypertensive medications  . Descending thoracic aortic aneurysm [facility]) 09/05/2011    08/2011 CT: Interval enlargement of the associated descending thoracic aortic    . 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 AAA (abdominal aortic aneurysm) [facility])      Patient Active Problem List   Diagnosis Date Noted  . Seizure ([place]) 12/20/2014  . Absolute anemia 12/20/2014  . Coagulation disorder ([place]) 12/20/2014  . Dissecting aortic aneurysm, thoracic ([place]) 12/20/2014  . 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 Gout 12/20/2014  . Plantar fasciitis 12/20/2014  . Error, refractive, myopia 12/20/2014  . History of BPH 12/20/2014  . Mitral and aortic regurgitation 12/20/2014  . H/O  abdominal aortic aneurysm repair 10/05/2014  . Left flank pain 10/23/2012  . Headache(784.0) 02/03/2012  . 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 Other specified abnormal immunological findings in serum 12/09/2011  . Aortic heart valve narrowing 12/01/2011  . Cardiac murmur 12/01/2011  . Cerebrovascular accident, old 12/01/2011  . Dissection of aorta, thoracic ([place]) 09/23/2011  . Thoracic aneurysm without mention of rupture 09/23/2011  . History of thoracic aortic aneurysm repair 09/23/2011  . Epistaxis 09/05/2011  . TIA (transient ischemic attack) 09/05/2011  . 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 [place] Horner syndrome 08/17/2011  . History of aortic valve replacement 03/06/2011  . Sinus bradycardia 03/06/2011  . Right bundle branch block 03/06/2011  . Hyperlipidemia 08/11/2008  . Essential hypertension 08/11/2008  . History of open heart surgery 06/20/2008  . Insomnia 03/19/2003  . Ascending aortic dissection ([place]) 02/09/2002  . CAD in native artery 03/18/1997    Past Surgical History  Procedure Laterality Date  . Cardiac catheterization    . Vascular surgery    . Abdominal aortic aneurysm repair  12/02/11    [facility]  . Cardiac valve replacement  2001    aortic valve replaced    His family history includes Goiter in his mother; Heart attack in his brother, brother, father, and sister; Hypertension in his father and sister; Suicidality in his son.    Previous Medications   AMLODIPINE (NORVASC) 5 MG TABLET    TAKE ONE TABLET BY MOUTH TWICE DAILY   ASPIRIN 81 MG TABLET    Take 81 mg by mouth daily.   BIOFLAVONOID PRODUCTS (ESTER C PO)    Take by mouth daily.   CALCIUM CARBONATE 200 MG CAPSULE    Take 250 mg by mouth 2 (two) times daily with a meal.   CARVEDILOL (COREG) 25 MG TABLET    TAKE ONE TABLET BY MOUTH EVERY 12 HOURS   COENZYME Q10 (CO Q 10 PO)    Take by mouth daily.   COLCHICINE (COLCRYS) 0.6 MG  TABLET    Take 0.6 mg by mouth daily. Take 2 tablets once, take an additional tablet after 1 hour if needed   FUROSEMIDE (LASIX) 40 MG TABLET    Take 1 tablet by mouth daily.   HYDROCODONE-ACETAMINOPHEN (NORCO) 7.5-325 MG TABLET    Take 1 tablet by mouth at bedtime.   LISINOPRIL (PRINIVIL,ZESTRIL) 5 MG TABLET    Take 1 tablet by mouth daily.   METOPROLOL (LOPRESSOR) 50 MG TABLET    Take 12.5 mg by mouth 2 (two) times daily.   MULTIPLE VITAMINS-MINERALS (CORAL CALCIUM PLUS) CAPS    Take 1 tablet by mouth daily.   PRAVASTATIN (PRAVACHOL) 80 MG TABLET    TAKE ONE TABLET AT BEDTIME   VITAMIN B-12 (CYANOCOBALAMIN) 1000 MCG TABLET    Take 1,000 mcg by mouth daily.   WARFARIN (COUMADIN) 10 MG TABLET    TAKE 1 TO 2 TABLETS BY MOUTH AS DIRECTEDBY MD    Patient Care Team: Birdie Sons, MD as PCP - General (Family Medicine) Renella Cunas, MD (Cardiology)     Objective:   Vitals: BP 124/60 mmHg  Pulse 54  Temp(Src) 98 F (36.7 C) (Oral)  Resp 16  Wt 213 lb (96.616 kg)  SpO2 97%  Physical Exam   General Appearance:    Alert, cooperative, no distress, appears stated age  Head:    Normocephalic, without obvious abnormality, atraumatic  Eyes:    PERRL, conjunctiva/corneas clear, EOM's intact, fundi    benign, both eyes       Ears:    Normal TM's and external ear canals, both ears  Nose:   Nares normal, septum midline, mucosa normal, no drainage   or sinus tenderness  Throat:   Lips, mucosa, and tongue normal; teeth and gums normal  Neck:   Supple, symmetrical, trachea midline, no adenopathy;       thyroid:  No enlargement/tenderness/nodules; no carotid   bruit or JVD  Back:     Symmetric, no curvature, ROM normal, no CVA tenderness  Lungs:     Clear to auscultation bilaterally, respirations unlabored  Chest wall:    No tenderness or deformity  Heart:    Regular rate and rhythm, S1 and S2 normal, no murmur, rub   or gallop  Abdomen:     Soft, non-tender, bowel sounds active all four  quadrants,    no masses, no organomegaly  Genitalia:    deferred  Rectal:    deferred  Extremities:   Extremities normal, atraumatic, no cyanosis or edema  Pulses:   2+ and symmetric all extremities  Skin:   Skin color, texture,  turgor normal, no rashes or lesions  Lymph nodes:   Cervical, supraclavicular, and axillary nodes normal  Neurologic:   CNII-XII intact. Normal strength, sensation and reflexes      throughout    Activities of Daily Living In your present state of health, do you have any difficulty performing the following activities: 08/09/2015  Hearing? N  Vision? N  Difficulty concentrating or making decisions? N  Walking or climbing stairs? N  Dressing or bathing? N  Doing errands, shopping? N    Fall Risk Assessment Fall Risk  08/09/2015 08/09/2015  Falls in the past year? No No     Depression Screen PHQ 2/9 Scores 08/09/2015 08/09/2015  PHQ - 2 Score 0 0    Cognitive Testing - 6-CIT  Correct? Score   What year is it? yes 0 0 or 4  What month is it? yes 0 0 or 3  Memorize:    Pia Mau,  40,  [place],      What time is it? (within 1 hour) yes 0 0 or 3  Count backwards from 20 yes 0 0, 2, or 4  Name the months of the year yes 0 0, 2, or 4  Repeat name & address above yes 0 0, 2, 4, 6, 8, or 10       TOTAL SCORE  0/28   Interpretation:  Normal  Normal (0-7) Abnormal (8-28)    Audit-C Alcohol Use Screening  Question Answer Points  How often do you have alcoholic drink? 2 or 3 times a week 3  On days you do drink alcohol, how many drinks do you typically consume? 0 0  How oftey will you drink 6 or more in a total? never 0  Total Score:  3   A score of 3 or more in women, and 4 or more in men indicates increased risk for alcohol abuse, EXCEPT if all of the points are from question 1.     Assessment & Plan:     Physical  Reviewed patient's Family Medical History Reviewed and updated list of patient's medical providers Assessment of  cognitive impairment was done Assessed patient's functional ability Established a written schedule for health screening [place] Completed and Reviewed  Exercise Activities and Dietary recommendations Goals    None      Immunization History  Administered Date(s) Administered  . Influenza, High Dose Seasonal PF 12/20/2014  . Pneumococcal Conjugate-13 05/19/2013  . Pneumococcal Polysaccharide-23 08/25/2009  . Tdap 01/02/2011  . Zoster 02/15/2010    Health Maintenance  Topic Date Due  . Hepatitis C Screening  [birth year]  . COLONOSCOPY  10/25/1993  . INFLUENZA VACCINE  10/17/2015  . TETANUS/TDAP  01/01/2021  . ZOSTAVAX  Completed  . PNA vac Low Risk Adult  Completed      Discussed health benefits of physical activity, and encouraged him to engage in regular exercise appropriate for his age and condition.    --------------------------------------------------------------------------------  1. Annual physical exam Doing well. Feels great. Continue yearly follow up cardiologist, and with vascular surgery in spring 2018.  Prefers not to have colonoscopy due to medical history and anticoagulant therapy. Was given OC-Lyte collection kit.  - Lipid panel - Comprehensive metabolic panel  2. Essential hypertension Well controlled.  Continue current medications.    3. Coagulation disorder ([place]) Doing well current medications.   4. Anemia, unspecified anemia type  - CBC  5. Prostate cancer screening  - PSA  6.  Right hip pain  - AMB referral to orthopedics

## 2015-08-10 ENCOUNTER — Telehealth: Payer: Self-pay

## 2015-08-10 DIAGNOSIS — D649 Anemia, unspecified: Secondary | ICD-10-CM | POA: Diagnosis not present

## 2015-08-10 DIAGNOSIS — Z0000 Encounter for general adult medical examination without abnormal findings: Secondary | ICD-10-CM | POA: Diagnosis not present

## 2015-08-10 DIAGNOSIS — Z136 Encounter for screening for cardiovascular disorders: Secondary | ICD-10-CM | POA: Diagnosis not present

## 2015-08-10 DIAGNOSIS — Z125 Encounter for screening for malignant neoplasm of prostate: Secondary | ICD-10-CM | POA: Diagnosis not present

## 2015-08-10 DIAGNOSIS — E875 Hyperkalemia: Secondary | ICD-10-CM

## 2015-08-10 NOTE — Telephone Encounter (Signed)
Patient brought in OC lite test and results were negative.

## 2015-08-11 ENCOUNTER — Ambulatory Visit (INDEPENDENT_AMBULATORY_CARE_PROVIDER_SITE_OTHER): Payer: Medicare HMO

## 2015-08-11 DIAGNOSIS — Z954 Presence of other heart-valve replacement: Secondary | ICD-10-CM

## 2015-08-11 DIAGNOSIS — Z952 Presence of prosthetic heart valve: Secondary | ICD-10-CM

## 2015-08-11 LAB — COMPREHENSIVE METABOLIC PANEL
ALT: 15 IU/L (ref 0–44)
AST: 23 IU/L (ref 0–40)
Albumin/Globulin Ratio: 1.6 (ref 1.2–2.2)
Albumin: 4.1 g/dL (ref 3.5–4.8)
Alkaline Phosphatase: 85 IU/L (ref 39–117)
BUN/Creatinine Ratio: 12 (ref 10–24)
BUN: 14 mg/dL (ref 8–27)
Bilirubin Total: 0.4 mg/dL (ref 0.0–1.2)
CALCIUM: 9.8 mg/dL (ref 8.6–10.2)
CO2: 25 mmol/L (ref 18–29)
CREATININE: 1.13 mg/dL (ref 0.76–1.27)
Chloride: 102 mmol/L (ref 96–106)
GFR calc Af Amer: 75 mL/min/{1.73_m2} (ref >59)
GFR, EST NON AFRICAN AMERICAN: 65 mL/min/{1.73_m2} (ref >59)
Globulin, Total: 2.5 g/dL (ref 1.5–4.5)
Glucose: 94 mg/dL (ref 65–99)
Potassium: 5.9 mmol/L — ABNORMAL HIGH (ref 3.5–5.2)
Sodium: 143 mmol/L (ref 134–144)
Total Protein: 6.6 g/dL (ref 6.0–8.5)

## 2015-08-11 LAB — LIPID PANEL
CHOL/HDL RATIO: 4.6 ratio (ref 0.0–5.0)
Cholesterol, Total: 166 mg/dL (ref 100–199)
HDL: 36 mg/dL — ABNORMAL LOW (ref >39)
LDL Calculated: 105 mg/dL — ABNORMAL HIGH (ref 0–99)
TRIGLYCERIDES: 124 mg/dL (ref 0–149)
VLDL Cholesterol Cal: 25 mg/dL (ref 5–40)

## 2015-08-11 LAB — PSA: PROSTATE SPECIFIC AG, SERUM: 1.2 ng/mL (ref 0.0–4.0)

## 2015-08-11 LAB — CBC
HEMATOCRIT: 41.5 % (ref 37.5–51.0)
Hemoglobin: 13.5 g/dL (ref 12.6–17.7)
MCH: 28.7 pg (ref 26.6–33.0)
MCHC: 32.5 g/dL (ref 31.5–35.7)
MCV: 88 fL (ref 79–97)
Platelets: 171 10*3/uL (ref 150–379)
RBC: 4.7 x10E6/uL (ref 4.14–5.80)
RDW: 14.1 % (ref 12.3–15.4)
WBC: 4.7 10*3/uL (ref 3.4–10.8)

## 2015-08-11 LAB — POCT INR
INR: 1.5
PT: 18

## 2015-08-11 NOTE — Patient Instructions (Signed)
Anticoagulation Dose Instructions as of 08/11/2015      Dorene Grebe Tue Wed Thu Fri Sat   New Dose 10 mg 5 mg 10 mg 10 mg 10 mg 5 mg 10 mg    Description        Take 10 mg daily except 5 mg on M and F, F/U 2 weeks

## 2015-08-15 NOTE — Telephone Encounter (Signed)
Lab order printed

## 2015-08-15 NOTE — Telephone Encounter (Signed)
-----   Message from Birdie Sons, MD sent at 08/11/2015  7:52 AM EDT ----- Potassium levels are unusually high. Avoid potassium rich foods, such as bananas and fruit juices. Drink more water. Recheck potassium level in 3 weeks.

## 2015-08-16 DIAGNOSIS — M7061 Trochanteric bursitis, right hip: Secondary | ICD-10-CM | POA: Diagnosis not present

## 2015-08-23 ENCOUNTER — Ambulatory Visit (INDEPENDENT_AMBULATORY_CARE_PROVIDER_SITE_OTHER): Payer: Medicare HMO

## 2015-08-23 DIAGNOSIS — Z952 Presence of prosthetic heart valve: Secondary | ICD-10-CM

## 2015-08-23 DIAGNOSIS — Z954 Presence of other heart-valve replacement: Secondary | ICD-10-CM | POA: Diagnosis not present

## 2015-08-23 LAB — POCT INR
INR: 3.9
PT: 47.1

## 2015-08-23 NOTE — Patient Instructions (Signed)
Anticoagulation Dose Instructions as of 08/23/2015      Jason Mcdaniel Tue Wed Thu Fri Sat   New Dose 10 mg 5 mg 10 mg 10 mg 10 mg 5 mg 10 mg    Description        Take 10 mg daily except 5 mg on M, W and F, F/U 2 weeks

## 2015-08-25 ENCOUNTER — Ambulatory Visit: Payer: Medicare HMO

## 2015-09-06 ENCOUNTER — Ambulatory Visit (INDEPENDENT_AMBULATORY_CARE_PROVIDER_SITE_OTHER): Payer: Medicare HMO

## 2015-09-06 DIAGNOSIS — Z952 Presence of prosthetic heart valve: Secondary | ICD-10-CM

## 2015-09-06 DIAGNOSIS — Z954 Presence of other heart-valve replacement: Secondary | ICD-10-CM

## 2015-09-06 LAB — POCT INR
INR: 2.9
PT: 35

## 2015-09-06 NOTE — Patient Instructions (Signed)
Anticoagulation Dose Instructions as of 09/06/2015      Jason Mcdaniel Tue Wed Thu Fri Sat   New Dose 10 mg 5 mg 10 mg 10 mg 10 mg 5 mg 10 mg    Description        Take 10 mg daily except 5 mg on M, and F, F/U 4 weeks

## 2015-09-08 DIAGNOSIS — E875 Hyperkalemia: Secondary | ICD-10-CM | POA: Diagnosis not present

## 2015-09-09 LAB — POTASSIUM: POTASSIUM: 5.3 mmol/L — AB (ref 3.5–5.2)

## 2015-09-21 DIAGNOSIS — H2512 Age-related nuclear cataract, left eye: Secondary | ICD-10-CM | POA: Diagnosis not present

## 2015-09-26 ENCOUNTER — Ambulatory Visit
Admission: RE | Admit: 2015-09-26 | Discharge: 2015-09-26 | Disposition: A | Discharge status: Home / Self Care | Payer: Managed Care, Other (non HMO) | Source: Ambulatory Visit | Attending: Ophthalmology | Admitting: Ophthalmology

## 2015-09-26 ENCOUNTER — Ambulatory Visit: Payer: Managed Care, Other (non HMO) | Admitting: Anesthesiology

## 2015-09-26 ENCOUNTER — Encounter: Payer: Self-pay | Admitting: *Deleted

## 2015-09-26 ENCOUNTER — Encounter
Admission: RE | Disposition: A | Discharge status: Home / Self Care | Payer: Self-pay | Source: Ambulatory Visit | Attending: Ophthalmology

## 2015-09-26 DIAGNOSIS — G709 Myoneural disorder, unspecified: Secondary | ICD-10-CM | POA: Diagnosis not present

## 2015-09-26 DIAGNOSIS — Z880 Allergy status to penicillin: Secondary | ICD-10-CM | POA: Insufficient documentation

## 2015-09-26 DIAGNOSIS — I739 Peripheral vascular disease, unspecified: Secondary | ICD-10-CM | POA: Insufficient documentation

## 2015-09-26 DIAGNOSIS — G40909 Epilepsy, unspecified, not intractable, without status epilepticus: Secondary | ICD-10-CM | POA: Diagnosis not present

## 2015-09-26 DIAGNOSIS — I10 Essential (primary) hypertension: Secondary | ICD-10-CM | POA: Diagnosis not present

## 2015-09-26 DIAGNOSIS — Z862 Personal history of diseases of the blood and blood-forming organs and certain disorders involving the immune mechanism: Secondary | ICD-10-CM | POA: Diagnosis not present

## 2015-09-26 DIAGNOSIS — Z8673 Personal history of transient ischemic attack (TIA), and cerebral infarction without residual deficits: Secondary | ICD-10-CM | POA: Diagnosis not present

## 2015-09-26 DIAGNOSIS — I2510 Atherosclerotic heart disease of native coronary artery without angina pectoris: Secondary | ICD-10-CM | POA: Insufficient documentation

## 2015-09-26 DIAGNOSIS — G902 Horner's syndrome: Secondary | ICD-10-CM | POA: Insufficient documentation

## 2015-09-26 DIAGNOSIS — H2512 Age-related nuclear cataract, left eye: Secondary | ICD-10-CM | POA: Diagnosis not present

## 2015-09-26 DIAGNOSIS — R011 Cardiac murmur, unspecified: Secondary | ICD-10-CM | POA: Diagnosis not present

## 2015-09-26 DIAGNOSIS — D649 Anemia, unspecified: Secondary | ICD-10-CM | POA: Diagnosis not present

## 2015-09-26 DIAGNOSIS — I252 Old myocardial infarction: Secondary | ICD-10-CM | POA: Insufficient documentation

## 2015-09-26 HISTORY — DX: Atherosclerotic heart disease of native coronary artery without angina pectoris: I25.10

## 2015-09-26 HISTORY — DX: Unspecified hearing loss, unspecified ear: H91.90

## 2015-09-26 HISTORY — DX: Acute myocardial infarction, unspecified: I21.9

## 2015-09-26 HISTORY — DX: Cardiac arrhythmia, unspecified: I49.9

## 2015-09-26 HISTORY — DX: Cerebral infarction, unspecified: I63.9

## 2015-09-26 HISTORY — PX: CATARACT EXTRACTION W/PHACO: SHX586

## 2015-09-26 SURGERY — PHACOEMULSIFICATION, CATARACT, WITH IOL INSERTION
Anesthesia: Monitor Anesthesia Care | Site: Eye | Laterality: Left | Wound class: Clean

## 2015-09-26 MED ORDER — CARBACHOL 0.01 % IO SOLN
INTRAOCULAR | Status: DC | PRN
  Administered 2015-09-26: 0.5 mL via INTRAOCULAR

## 2015-09-26 MED ORDER — MIDAZOLAM HCL 2 MG/2ML IJ SOLN
INTRAMUSCULAR | Status: DC | PRN
  Administered 2015-09-26: 1 mg via INTRAVENOUS

## 2015-09-26 MED ORDER — MOXIFLOXACIN HCL 0.5 % OP SOLN
OPHTHALMIC | Status: DC | PRN
  Administered 2015-09-26: 1 [drp] via OPHTHALMIC

## 2015-09-26 MED ORDER — NA CHONDROIT SULF-NA HYALURON 40-17 MG/ML IO SOLN
INTRAOCULAR | Status: AC
  Filled 2015-09-26: qty 1

## 2015-09-26 MED ORDER — NA CHONDROIT SULF-NA HYALURON 40-17 MG/ML IO SOLN
INTRAOCULAR | Status: DC | PRN
  Administered 2015-09-26: 1 mL via INTRAOCULAR

## 2015-09-26 MED ORDER — MOXIFLOXACIN HCL 0.5 % OP SOLN
OPHTHALMIC | Status: AC
  Filled 2015-09-26: qty 3

## 2015-09-26 MED ORDER — EPINEPHRINE HCL 1 MG/ML IJ SOLN
INTRAOCULAR | Status: DC | PRN
  Administered 2015-09-26: 08:00:00 via OPHTHALMIC

## 2015-09-26 MED ORDER — FENTANYL CITRATE (PF) 100 MCG/2ML IJ SOLN
INTRAMUSCULAR | Status: DC | PRN
  Administered 2015-09-26: 50 ug via INTRAVENOUS

## 2015-09-26 MED ORDER — TETRACAINE HCL 0.5 % OP SOLN
OPHTHALMIC | Status: AC
  Administered 2015-09-26: 07:00:00
  Filled 2015-09-26: qty 2

## 2015-09-26 MED ORDER — ARMC OPHTHALMIC DILATING GEL
1.0000 "application " | OPHTHALMIC | Status: DC | PRN
  Administered 2015-09-26: 1 via OPHTHALMIC

## 2015-09-26 MED ORDER — MOXIFLOXACIN HCL 0.5 % OP SOLN
1.0000 [drp] | OPHTHALMIC | Status: AC | PRN
  Administered 2015-09-26: 1 [drp] via OPHTHALMIC
  Filled 2015-09-26: qty 3

## 2015-09-26 MED ORDER — CEFUROXIME OPHTHALMIC INJECTION 1 MG/0.1 ML
INJECTION | OPHTHALMIC | Status: AC
  Filled 2015-09-26: qty 0.1

## 2015-09-26 MED ORDER — EPINEPHRINE HCL 1 MG/ML IJ SOLN
INTRAMUSCULAR | Status: AC
  Filled 2015-09-26: qty 1

## 2015-09-26 MED ORDER — POVIDONE-IODINE 5 % OP SOLN
OPHTHALMIC | Status: AC
  Administered 2015-09-26: 07:00:00
  Filled 2015-09-26: qty 30

## 2015-09-26 MED ORDER — SODIUM CHLORIDE 0.9 % IV SOLN
INTRAVENOUS | Status: DC
  Administered 2015-09-26 (×2): via INTRAVENOUS

## 2015-09-26 MED ORDER — ARMC OPHTHALMIC DILATING GEL
OPHTHALMIC | Status: AC
  Administered 2015-09-26: 07:00:00
  Filled 2015-09-26: qty 0.25

## 2015-09-26 MED ORDER — TETRACAINE HCL 0.5 % OP SOLN: 1.0000 [drp] | OPHTHALMIC | Status: DC | PRN

## 2015-09-26 MED ORDER — POVIDONE-IODINE 5 % OP SOLN: 1.0000 "application " | OPHTHALMIC | Status: DC | PRN

## 2015-09-26 SURGICAL SUPPLY — 21 items
CANNULA ANT/CHMB 27GA (MISCELLANEOUS) ×2 IMPLANT
CUP MEDICINE 2OZ PLAST GRAD ST (MISCELLANEOUS) ×2 IMPLANT
GLOVE BIO SURGEON STRL SZ8 (GLOVE) ×2 IMPLANT
GLOVE BIOGEL M 6.5 STRL (GLOVE) ×2 IMPLANT
GLOVE SURG LX 8.0 MICRO (GLOVE) ×1
GLOVE SURG LX STRL 8.0 MICRO (GLOVE) ×1 IMPLANT
GOWN STRL REUS W/ TWL LRG LVL3 (GOWN DISPOSABLE) ×2 IMPLANT
GOWN STRL REUS W/TWL LRG LVL3 (GOWN DISPOSABLE) ×2
LENS IOL TECNIS ITEC 19.5 (Intraocular Lens) ×2 IMPLANT
PACK CATARACT (MISCELLANEOUS) ×2 IMPLANT
PACK CATARACT BRASINGTON LX (MISCELLANEOUS) ×2 IMPLANT
PACK EYE AFTER SURG (MISCELLANEOUS) ×2 IMPLANT
SOL BSS BAG (MISCELLANEOUS) ×2
SOL PREP PVP 2OZ (MISCELLANEOUS) ×2
SOLUTION BSS BAG (MISCELLANEOUS) ×1 IMPLANT
SOLUTION PREP PVP 2OZ (MISCELLANEOUS) ×1 IMPLANT
SYR 3ML LL SCALE MARK (SYRINGE) ×2 IMPLANT
SYR 5ML LL (SYRINGE) ×2 IMPLANT
SYR TB 1ML 27GX1/2 LL (SYRINGE) ×2 IMPLANT
WATER STERILE IRR 1000ML POUR (IV SOLUTION) ×2 IMPLANT
WIPE NON LINTING 3.25X3.25 (MISCELLANEOUS) ×2 IMPLANT

## 2015-09-26 NOTE — Transfer of Care (Signed)
Immediate Anesthesia Transfer of Care Note  Patient: Jason Mcdaniel  Procedure(s) Performed: Procedure(s) with comments: CATARACT EXTRACTION PHACO AND INTRAOCULAR LENS PLACEMENT (IOC) (Left) - [place] 30.0 AP% 23.4 CDE 7.08 Fluid pack lot # [id number]  Patient Location: PACU  Anesthesia Type:MAC  Level of Consciousness: awake, alert  and oriented  Airway & Oxygen Therapy: Patient Spontanous Breathing  Post-op Assessment: Report given to RN and Post -op Vital signs reviewed and stable  Post vital signs: Reviewed and stable  Last Vitals:  Filed Vitals:   09/26/15 0706  BP: 144/68  Pulse: 55  Temp: 36 C  Resp: 16    Last Pain: There were no vitals filed for this visit.       Complications: No apparent anesthesia complications

## 2015-09-26 NOTE — Discharge Instructions (Signed)
Eye Surgery Discharge Instructions  Expect mild scratchy sensation or mild soreness. DO NOT RUB YOUR EYE!  The day of surgery:  Minimal physical activity, but bed rest is not required  No reading, computer work, or close hand work  No bending, lifting, or straining.  May watch TV  For 24 hours:  No driving, legal decisions, or alcoholic beverages  Safety precautions  Eat anything you prefer: It is better to start with liquids, then soup then solid foods.  _____ Eye patch should be worn until postoperative exam tomorrow.  ____ Solar shield eyeglasses should be worn for comfort in the sunlight/patch while sleeping  Resume all regular medications including aspirin or Coumadin if these were discontinued prior to surgery. You may shower, bathe, shave, or wash your hair. Tylenol may be taken for mild discomfort.  Call your doctor if you experience significant pain, nausea, or vomiting, fever > 101 or other signs of infection. [phone] or [phone] Specific instructions:  Follow-up Information    Follow up with Tim Lair, MD On 09/27/2015.   Specialty:  Ophthalmology   Why:  8:45   Contact information:   [address] [place] [address] [phone]

## 2015-09-26 NOTE — H&P (Signed)
  All labs reviewed. Abnormal studies sent to patients PCP when indicated.  Previous H&P reviewed, patient examined, there are NO CHANGES.  Jason Mcdaniel,[place] LOU[place]7/11/20178:08 AM

## 2015-09-26 NOTE — Anesthesia Preprocedure Evaluation (Addendum)
Anesthesia Evaluation  Patient identified by MRN, date of birth, ID band Patient awake    Reviewed: Allergy & Precautions, NPO status , Patient's Chart, lab work & pertinent test results  Airway Mallampati: II  TM Distance: >3 FB     Dental   Pulmonary    Pulmonary exam normal        Cardiovascular hypertension, Pt. on medications + CAD, + Past MI and + Peripheral Vascular Disease  Normal cardiovascular exam+ dysrhythmias + Valvular Problems/Murmurs      Neuro/Psych  Headaches, Seizures -, Well Controlled,  horners syndrome TIA Neuromuscular disease CVA, No Residual Symptoms    GI/Hepatic negative GI ROS, Neg liver ROS,   Endo/Other  negative endocrine ROS  Renal/GU negative Renal ROS     Musculoskeletal   Abdominal Normal abdominal exam  (+)   Peds negative pediatric ROS (+)  Hematology  (+) anemia ,   Anesthesia Other Findings AAA Thoracic aneurism AV repair CVA MI EF 55-65%  Reproductive/Obstetrics                            Anesthesia Physical Anesthesia Plan  ASA: IV  Anesthesia Plan: MAC   Post-op Pain Management:    Induction: Intravenous  Airway Management Planned: Nasal Cannula  Additional Equipment:   Intra-op Plan:   Post-operative Plan:   Informed Consent:   Dental advisory given  Plan Discussed with: CRNA and Surgeon  Anesthesia Plan Comments:         Anesthesia Quick Evaluation

## 2015-09-26 NOTE — Op Note (Signed)
PREOPERATIVE DIAGNOSIS:  Nuclear sclerotic cataract of the left eye.   POSTOPERATIVE DIAGNOSIS:  Nuclear sclerotic cataract of the left eye.   OPERATIVE PROCEDURE: Procedure(s): CATARACT EXTRACTION PHACO AND INTRAOCULAR LENS PLACEMENT (IOC)   SURGEON:  Birder Robson, MD.   ANESTHESIA:  Anesthesiologist: Alvin Critchley, MD CRNA: Demetrius Charity, CRNA; Jenetta Downer, CRNA  1.      Managed anesthesia care. 2.      Topical tetracaine drops followed by 2% Xylocaine jelly applied in the preoperative holding area.   COMPLICATIONS:  None.   TECHNIQUE:   Stop and chop   DESCRIPTION OF PROCEDURE:  The patient was examined and consented in the preoperative holding area where the aforementioned topical anesthesia was applied to the left eye and then brought back to the Operating Room where the left eye was prepped and draped in the usual sterile ophthalmic fashion and a lid speculum was placed. A paracentesis was created with the side port blade and the anterior chamber was filled with viscoelastic. A near clear corneal incision was performed with the steel keratome. A continuous curvilinear capsulorrhexis was performed with a cystotome followed by the capsulorrhexis forceps. Hydrodissection and hydrodelineation were carried out with BSS on a blunt cannula. The lens was removed in a stop and chop  technique and the remaining cortical material was removed with the irrigation-aspiration handpiece. The capsular bag was inflated with viscoelastic and the Technis [id number] lens was placed in the capsular bag without complication. The remaining viscoelastic was removed from the eye with the irrigation-aspiration handpiece. The wounds were hydrated. The anterior chamber was flushed with Miostat and the eye was inflated to physiologic pressure. 0.2 mL of Vigamox diluted three/one with BSS was placed in the anterior chamber. The wounds were found to be water tight. The eye was dressed with Vigamox. The  patient was given protective glasses to wear throughout the day and a shield with which to sleep tonight. The patient was also given drops with which to begin a drop regimen today and will follow-up with me in one day.  Implant Name Type Inv. Item Serial No. Manufacturer Lot No. LRB No. Used  LENS IOL DIOP 19.5 - [id number] Intraocular Lens LENS IOL DIOP 19.5 [id number]   Left 1    Procedure(s) with comments: CATARACT EXTRACTION PHACO AND INTRAOCULAR LENS PLACEMENT (IOC) (Left) - [place] 30.0 AP% 23.4 CDE 7.08 Fluid pack lot # [id number]  Electronically signed: [place] 09/26/2015 8:31 AM

## 2015-09-27 NOTE — Anesthesia Postprocedure Evaluation (Signed)
Anesthesia Post Note  Patient: Jason Mcdaniel  Procedure(s) Performed: Procedure(s) (LRB): CATARACT EXTRACTION PHACO AND INTRAOCULAR LENS PLACEMENT (IOC) (Left)  Patient location during evaluation: PACU Anesthesia Type: MAC Level of consciousness: awake Pain management: pain level controlled Vital Signs Assessment: post-procedure vital signs reviewed and stable Respiratory status: spontaneous breathing Cardiovascular status: blood pressure returned to baseline    Last Vitals:  Filed Vitals:   09/26/15 0834 09/26/15 0845  BP: 147/67 151/75  Pulse: 54 54  Temp: 36.5 C   Resp: 16 16    Last Pain: There were no vitals filed for this visit.               Anandi Abramo

## 2015-10-02 DIAGNOSIS — H2511 Age-related nuclear cataract, right eye: Secondary | ICD-10-CM | POA: Diagnosis not present

## 2015-10-03 ENCOUNTER — Encounter: Payer: Self-pay | Admitting: *Deleted

## 2015-10-04 ENCOUNTER — Ambulatory Visit (INDEPENDENT_AMBULATORY_CARE_PROVIDER_SITE_OTHER): Payer: Medicare HMO

## 2015-10-04 DIAGNOSIS — Z952 Presence of prosthetic heart valve: Secondary | ICD-10-CM

## 2015-10-04 DIAGNOSIS — Z954 Presence of other heart-valve replacement: Secondary | ICD-10-CM | POA: Diagnosis not present

## 2015-10-05 LAB — POCT INR
INR: 3.2
PT: 38.9

## 2015-10-05 NOTE — Patient Instructions (Signed)
Anticoagulation Dose Instructions as of 10/04/2015      Jason Mcdaniel Tue Wed Thu Fri Sat   New Dose 10 mg 5 mg 10 mg 10 mg 10 mg 5 mg 10 mg    Description        Take 10 mg daily except 5 mg on M, and F, F/U 4 weeks

## 2015-10-09 ENCOUNTER — Telehealth: Payer: Self-pay | Admitting: Family Medicine

## 2015-10-09 NOTE — Telephone Encounter (Signed)
Patient is aware that he is to stop coumadin 1 day prior to surgery.

## 2015-10-09 NOTE — Telephone Encounter (Signed)
Jason Mcdaniel from [facility] stating she sent a fax last week about pt's medication. A medication that the pt may need to stop before pt has surgery on Tuesday. Please return cindy's call @ [phone].  Thanks CC

## 2015-10-10 ENCOUNTER — Ambulatory Visit: Payer: Managed Care, Other (non HMO) | Admitting: Anesthesiology

## 2015-10-10 ENCOUNTER — Encounter
Admission: RE | Disposition: A | Discharge status: Home / Self Care | Payer: Self-pay | Source: Ambulatory Visit | Attending: Ophthalmology

## 2015-10-10 ENCOUNTER — Ambulatory Visit
Admission: RE | Admit: 2015-10-10 | Discharge: 2015-10-10 | Disposition: A | Discharge status: Home / Self Care | Payer: Managed Care, Other (non HMO) | Source: Ambulatory Visit | Attending: Ophthalmology | Admitting: Ophthalmology

## 2015-10-10 ENCOUNTER — Encounter: Payer: Self-pay | Admitting: *Deleted

## 2015-10-10 DIAGNOSIS — H2511 Age-related nuclear cataract, right eye: Secondary | ICD-10-CM | POA: Diagnosis not present

## 2015-10-10 DIAGNOSIS — I252 Old myocardial infarction: Secondary | ICD-10-CM | POA: Insufficient documentation

## 2015-10-10 DIAGNOSIS — I739 Peripheral vascular disease, unspecified: Secondary | ICD-10-CM | POA: Diagnosis not present

## 2015-10-10 DIAGNOSIS — G709 Myoneural disorder, unspecified: Secondary | ICD-10-CM | POA: Insufficient documentation

## 2015-10-10 DIAGNOSIS — R011 Cardiac murmur, unspecified: Secondary | ICD-10-CM | POA: Insufficient documentation

## 2015-10-10 DIAGNOSIS — G40909 Epilepsy, unspecified, not intractable, without status epilepticus: Secondary | ICD-10-CM | POA: Insufficient documentation

## 2015-10-10 DIAGNOSIS — I2510 Atherosclerotic heart disease of native coronary artery without angina pectoris: Secondary | ICD-10-CM | POA: Insufficient documentation

## 2015-10-10 DIAGNOSIS — G902 Horner's syndrome: Secondary | ICD-10-CM | POA: Insufficient documentation

## 2015-10-10 DIAGNOSIS — Z8673 Personal history of transient ischemic attack (TIA), and cerebral infarction without residual deficits: Secondary | ICD-10-CM | POA: Insufficient documentation

## 2015-10-10 DIAGNOSIS — I10 Essential (primary) hypertension: Secondary | ICD-10-CM | POA: Insufficient documentation

## 2015-10-10 HISTORY — DX: Edema, unspecified: R60.9

## 2015-10-10 HISTORY — PX: CATARACT EXTRACTION W/PHACO: SHX586

## 2015-10-10 HISTORY — DX: Cough: R05

## 2015-10-10 SURGERY — PHACOEMULSIFICATION, CATARACT, WITH IOL INSERTION
Anesthesia: Monitor Anesthesia Care | Site: Eye | Laterality: Right | Wound class: Clean

## 2015-10-10 MED ORDER — CEFUROXIME OPHTHALMIC INJECTION 1 MG/0.1 ML: INJECTION | OPHTHALMIC | Status: DC | PRN

## 2015-10-10 MED ORDER — ARMC OPHTHALMIC DILATING GEL
OPHTHALMIC | Status: AC
  Administered 2015-10-10: 1 via OPHTHALMIC
  Filled 2015-10-10: qty 0.25

## 2015-10-10 MED ORDER — NA CHONDROIT SULF-NA HYALURON 40-17 MG/ML IO SOLN
INTRAOCULAR | Status: AC
  Filled 2015-10-10: qty 1

## 2015-10-10 MED ORDER — TETRACAINE HCL 0.5 % OP SOLN
1.0000 [drp] | Freq: Once | OPHTHALMIC | Status: AC
  Administered 2015-10-10: 1 [drp] via OPHTHALMIC

## 2015-10-10 MED ORDER — SODIUM CHLORIDE 0.9 % IV SOLN
INTRAVENOUS | Status: DC
  Administered 2015-10-10: 15:00:00 via INTRAVENOUS

## 2015-10-10 MED ORDER — TETRACAINE HCL 0.5 % OP SOLN
OPHTHALMIC | Status: AC
  Administered 2015-10-10: 1 [drp] via OPHTHALMIC
  Filled 2015-10-10: qty 2

## 2015-10-10 MED ORDER — EPINEPHRINE HCL 1 MG/ML IJ SOLN
INTRAMUSCULAR | Status: AC
  Filled 2015-10-10: qty 1

## 2015-10-10 MED ORDER — MOXIFLOXACIN HCL 0.5 % OP SOLN: 1.0000 [drp] | OPHTHALMIC | Status: DC | PRN

## 2015-10-10 MED ORDER — NA CHONDROIT SULF-NA HYALURON 40-17 MG/ML IO SOLN
INTRAOCULAR | Status: DC | PRN
  Administered 2015-10-10: 1 mL via INTRAOCULAR

## 2015-10-10 MED ORDER — MOXIFLOXACIN HCL 0.5 % OP SOLN
OPHTHALMIC | Status: DC | PRN
  Administered 2015-10-10: 1 [drp] via OPHTHALMIC

## 2015-10-10 MED ORDER — POVIDONE-IODINE 5 % OP SOLN
1.0000 "application " | Freq: Once | OPHTHALMIC | Status: AC
  Administered 2015-10-10: 1 via OPHTHALMIC

## 2015-10-10 MED ORDER — CEFUROXIME OPHTHALMIC INJECTION 1 MG/0.1 ML
INJECTION | OPHTHALMIC | Status: AC
  Filled 2015-10-10: qty 0.1

## 2015-10-10 MED ORDER — MIDAZOLAM HCL 2 MG/2ML IJ SOLN
INTRAMUSCULAR | Status: DC | PRN
Start: 2015-10-10 — End: 2015-10-10
  Administered 2015-10-10: 1 mg via INTRAVENOUS

## 2015-10-10 MED ORDER — EPINEPHRINE HCL 1 MG/ML IJ SOLN
INTRAOCULAR | Status: DC | PRN
  Administered 2015-10-10: 1 mL via OPHTHALMIC

## 2015-10-10 MED ORDER — CARBACHOL 0.01 % IO SOLN
INTRAOCULAR | Status: DC | PRN
  Administered 2015-10-10: 0.5 mL via INTRAOCULAR

## 2015-10-10 MED ORDER — MOXIFLOXACIN HCL 0.5 % OP SOLN
OPHTHALMIC | Status: AC
  Filled 2015-10-10: qty 3

## 2015-10-10 MED ORDER — ARMC OPHTHALMIC DILATING GEL
1.0000 "application " | OPHTHALMIC | Status: AC | PRN
  Administered 2015-10-10 (×2): 1 via OPHTHALMIC

## 2015-10-10 MED ORDER — POVIDONE-IODINE 5 % OP SOLN
OPHTHALMIC | Status: AC
  Administered 2015-10-10: 1 via OPHTHALMIC
  Filled 2015-10-10: qty 30

## 2015-10-10 SURGICAL SUPPLY — 21 items
CANNULA ANT/CHMB 27GA (MISCELLANEOUS) ×2 IMPLANT
CUP MEDICINE 2OZ PLAST GRAD ST (MISCELLANEOUS) ×2 IMPLANT
GLOVE BIO SURGEON STRL SZ8 (GLOVE) ×2 IMPLANT
GLOVE BIOGEL M 6.5 STRL (GLOVE) ×2 IMPLANT
GLOVE SURG LX 8.0 MICRO (GLOVE) ×1
GLOVE SURG LX STRL 8.0 MICRO (GLOVE) ×1 IMPLANT
GOWN STRL REUS W/ TWL LRG LVL3 (GOWN DISPOSABLE) ×2 IMPLANT
GOWN STRL REUS W/TWL LRG LVL3 (GOWN DISPOSABLE) ×2
LENS IOL TECNIS ITEC 20.0 (Intraocular Lens) ×2 IMPLANT
PACK CATARACT (MISCELLANEOUS) ×2 IMPLANT
PACK CATARACT BRASINGTON LX (MISCELLANEOUS) ×2 IMPLANT
PACK EYE AFTER SURG (MISCELLANEOUS) ×2 IMPLANT
SOL BSS BAG (MISCELLANEOUS) ×2
SOL PREP PVP 2OZ (MISCELLANEOUS) ×2
SOLUTION BSS BAG (MISCELLANEOUS) ×1 IMPLANT
SOLUTION PREP PVP 2OZ (MISCELLANEOUS) ×1 IMPLANT
SYR 3ML LL SCALE MARK (SYRINGE) ×2 IMPLANT
SYR 5ML LL (SYRINGE) ×2 IMPLANT
SYR TB 1ML 27GX1/2 LL (SYRINGE) ×2 IMPLANT
WATER STERILE IRR 1000ML POUR (IV SOLUTION) ×2 IMPLANT
WIPE NON LINTING 3.25X3.25 (MISCELLANEOUS) ×2 IMPLANT

## 2015-10-10 NOTE — Anesthesia Preprocedure Evaluation (Signed)
Anesthesia Evaluation  Patient identified by MRN, date of birth, ID band Patient awake    Reviewed: Allergy & Precautions, NPO status , Patient's Chart, lab work & pertinent test results  History of Anesthesia Complications Negative for: history of anesthetic complications  Airway Mallampati: III  TM Distance: >3 FB Neck ROM: limited    Dental  (+) Poor Dentition, Chipped   Pulmonary    Pulmonary exam normal        Cardiovascular Exercise Tolerance: Poor hypertension, Pt. on medications + CAD, + Past MI and + Peripheral Vascular Disease  Normal cardiovascular exam+ dysrhythmias + Valvular Problems/Murmurs  Rhythm:regular Rate:Normal     Neuro/Psych  Headaches, Seizures -, Well Controlled,  horners syndrome TIA Neuromuscular disease CVA, No Residual Symptoms    GI/Hepatic negative GI ROS, Neg liver ROS,   Endo/Other  negative endocrine ROS  Renal/GU negative Renal ROS     Musculoskeletal   Abdominal Normal abdominal exam  (+)   Peds negative pediatric ROS (+)  Hematology  (+) anemia ,   Anesthesia Other Findings AAA Thoracic aneurism AV repair CVA MI EF 55-65%  Reproductive/Obstetrics                             Anesthesia Physical  Anesthesia Plan  ASA: IV  Anesthesia Plan: MAC   Post-op Pain Management:    Induction: Intravenous  Airway Management Planned: Nasal Cannula  Additional Equipment:   Intra-op Plan:   Post-operative Plan:   Informed Consent:   Dental advisory given  Plan Discussed with: CRNA and Surgeon  Anesthesia Plan Comments:         Anesthesia Quick Evaluation

## 2015-10-10 NOTE — Transfer of Care (Signed)
Immediate Anesthesia Transfer of Care Note  Patient: Jason Mcdaniel  Procedure(s) Performed: Procedure(s) with comments: CATARACT EXTRACTION PHACO AND INTRAOCULAR LENS PLACEMENT (IOC) (Right) - [place] 38.4 AP% 18.7 CDE 7.18 Fluid pack lot # [id number]  Patient Location: Short Stay  Anesthesia Type:MAC  Level of Consciousness: awake  Airway & Oxygen Therapy: Patient Spontanous Breathing  Post-op Assessment: Report given to RN and Post -op Vital signs reviewed and stable  Post vital signs: Reviewed  Last Vitals:  Vitals:   10/10/15 1338 10/10/15 1533  BP: (!) 149/74 (!) 158/76  Pulse: (!) 56 (!) 56  Resp: 20 18  Temp: 36.4 C 36.6 C    Last Pain:  Vitals:   10/10/15 1338  TempSrc: Oral  PainSc: 0-No pain      Patients Stated Pain Goal: 0 ([id number])  Complications: No apparent anesthesia complications

## 2015-10-10 NOTE — Discharge Instructions (Signed)

## 2015-10-10 NOTE — Op Note (Signed)
PREOPERATIVE DIAGNOSIS:  Nuclear sclerotic cataract of the right eye.   POSTOPERATIVE DIAGNOSIS:  NUCLEAR SCLERTOIC CATARACT RIGHT EYE   OPERATIVE PROCEDURE: Procedure(s): CATARACT EXTRACTION PHACO AND INTRAOCULAR LENS PLACEMENT (IOC)   SURGEON:  Birder Robson, MD.   ANESTHESIA:  Anesthesiologist: Gijsbertus Lonia Mad, MD CRNA: Rolla Plate, CRNA  1.      Managed anesthesia care. 2.      Topical tetracaine drops followed by 2% Xylocaine jelly applied in the preoperative holding area.   COMPLICATIONS:  None.   TECHNIQUE:   Stop and chop   DESCRIPTION OF PROCEDURE:  The patient was examined and consented in the preoperative holding area where the aforementioned topical anesthesia was applied to the right eye and then brought back to the Operating Room where the right eye was prepped and draped in the usual sterile ophthalmic fashion and a lid speculum was placed. A paracentesis was created with the side port blade and the anterior chamber was filled with viscoelastic. A near clear corneal incision was performed with the steel keratome. A continuous curvilinear capsulorrhexis was performed with a cystotome followed by the capsulorrhexis forceps. Hydrodissection and hydrodelineation were carried out with BSS on a blunt cannula. The lens was removed in a stop and chop  technique and the remaining cortical material was removed with the irrigation-aspiration handpiece. The capsular bag was inflated with viscoelastic and the Technis ZCB00  lens was placed in the capsular bag without complication. The remaining viscoelastic was removed from the eye with the irrigation-aspiration handpiece. The wounds were hydrated. The anterior chamber was flushed with Miostat and the eye was inflated to physiologic pressure. 0.2 mL of Vigamox diluted three/one with BSS was placed in the anterior chamber. The wounds were found to be water tight. The eye was dressed with Vigamox. The patient was given protective  glasses to wear throughout the day and a shield with which to sleep tonight. The patient was also given drops with which to begin a drop regimen today and will follow-up with me in one day.  Implant Name Type Inv. Item Serial No. Manufacturer Lot No. LRB No. Used  LENS IOL DIOP 20.0 - [id number] Intraocular Lens LENS IOL DIOP 20.0 [id number]   Right 1   Procedure(s) with comments: CATARACT EXTRACTION PHACO AND INTRAOCULAR LENS PLACEMENT (IOC) (Right) - [place] 38.4 AP% 18.7 CDE 7.18 Fluid pack lot # [id number]  Electronically signed: [place] 10/10/2015 3:36 PM

## 2015-10-10 NOTE — Anesthesia Postprocedure Evaluation (Signed)
Anesthesia Post Note  Patient: Jason Mcdaniel  Procedure(s) Performed: Procedure(s) (LRB): CATARACT EXTRACTION PHACO AND INTRAOCULAR LENS PLACEMENT (IOC) (Right)  Patient location during evaluation: Short Stay Anesthesia Type: MAC Level of consciousness: awake Pain management: pain level controlled Vital Signs Assessment: post-procedure vital signs reviewed and stable Respiratory status: spontaneous breathing Cardiovascular status: blood pressure returned to baseline Postop Assessment: no headache Anesthetic complications: no    Last Vitals:  Vitals:   10/10/15 1338 10/10/15 1533  BP: (!) 149/74 (!) 158/76  Pulse: (!) 56 (!) 56  Resp: 20 18  Temp: 36.4 C 36.6 C    Last Pain:  Vitals:   10/10/15 1338  TempSrc: Oral  PainSc: 0-No pain                 Rolla Plate P

## 2015-10-10 NOTE — H&P (Signed)
  All labs reviewed. Abnormal studies sent to patients PCP when indicated.  Previous H&P reviewed, patient examined, there are NO CHANGES.  Giada Schoppe,Kash LOUIS7/25/20173:06 PM

## 2015-10-20 ENCOUNTER — Encounter: Payer: Self-pay | Admitting: Family Medicine

## 2015-10-31 ENCOUNTER — Other Ambulatory Visit: Payer: Self-pay | Admitting: Family Medicine

## 2015-11-08 ENCOUNTER — Ambulatory Visit (INDEPENDENT_AMBULATORY_CARE_PROVIDER_SITE_OTHER): Payer: Managed Care, Other (non HMO)

## 2015-11-08 DIAGNOSIS — Z952 Presence of prosthetic heart valve: Secondary | ICD-10-CM

## 2015-11-08 DIAGNOSIS — Z954 Presence of other heart-valve replacement: Secondary | ICD-10-CM

## 2015-11-08 LAB — POCT INR
INR: 3.6
PT: 43.1

## 2015-11-08 NOTE — Patient Instructions (Signed)
Anticoagulation Dose Instructions as of 11/08/2015      Jason Mcdaniel Tue Wed Thu Fri Sat   New Dose 10 mg 5 mg 10 mg 10 mg 10 mg 5 mg 10 mg    Description   Take 10 mg daily except 5 mg on M, and F, F/U 4 weeks

## 2015-11-27 ENCOUNTER — Other Ambulatory Visit: Payer: Self-pay | Admitting: Family Medicine

## 2015-12-06 ENCOUNTER — Ambulatory Visit: Payer: Self-pay

## 2015-12-13 ENCOUNTER — Ambulatory Visit (INDEPENDENT_AMBULATORY_CARE_PROVIDER_SITE_OTHER): Payer: Managed Care, Other (non HMO)

## 2015-12-13 DIAGNOSIS — Z954 Presence of other heart-valve replacement: Secondary | ICD-10-CM

## 2015-12-13 DIAGNOSIS — Z952 Presence of prosthetic heart valve: Secondary | ICD-10-CM

## 2015-12-13 LAB — POCT INR
INR: 2.8
PT: 33.6

## 2015-12-13 NOTE — Patient Instructions (Signed)
Anticoagulation Dose Instructions as of 12/13/2015      Jason Mcdaniel Tue Wed Thu Fri Sat   New Dose 10 mg 5 mg 10 mg 10 mg 10 mg 5 mg 10 mg    Description   Take 10 mg daily except 5 mg on M, and F, F/U 4 weeks

## 2015-12-20 ENCOUNTER — Ambulatory Visit (INDEPENDENT_AMBULATORY_CARE_PROVIDER_SITE_OTHER): Payer: Managed Care, Other (non HMO) | Admitting: Family Medicine

## 2015-12-20 ENCOUNTER — Encounter: Payer: Self-pay | Admitting: Family Medicine

## 2015-12-20 VITALS — BP 124/60 | HR 54 | Temp 97.9°F | Resp 16 | Wt 209.0 lb

## 2015-12-20 DIAGNOSIS — Z1211 Encounter for screening for malignant neoplasm of colon: Secondary | ICD-10-CM

## 2015-12-20 DIAGNOSIS — I10 Essential (primary) hypertension: Secondary | ICD-10-CM

## 2015-12-20 DIAGNOSIS — Z23 Encounter for immunization: Secondary | ICD-10-CM | POA: Diagnosis not present

## 2015-12-20 DIAGNOSIS — I2510 Atherosclerotic heart disease of native coronary artery without angina pectoris: Secondary | ICD-10-CM

## 2015-12-20 DIAGNOSIS — Z1212 Encounter for screening for malignant neoplasm of rectum: Secondary | ICD-10-CM | POA: Diagnosis not present

## 2015-12-20 DIAGNOSIS — I726 Aneurysm of vertebral artery: Secondary | ICD-10-CM | POA: Diagnosis not present

## 2015-12-20 NOTE — Progress Notes (Signed)
Patient: Jason Mcdaniel Male    DOB: 03/17/1946   70 y.o.   MRN: [id number] Visit Date: 12/20/2015  Today's Provider: Lelon Huh, MD   Chief Complaint  Patient presents with  . Hypertension    follow up  . Coronary Artery Disease    follow up   Subjective:    HPI  Hypertension, follow-up:  BP Readings from Last 3 Encounters:  10/10/15 (!) 144/77  09/26/15 (!) 151/75  08/09/15 124/60    He was last seen for hypertension 4 months ago.  BP at that visit was 124/60. Management since that visit includes checking labs which showed Potassium levels high. Patient was advised to avoid Potassium rich foods, drink more water and recheck labs in 6 weeks. Patient had labs rechecked on 09/08/2015. Labs had improved, but levels were still high. Patient will have labs repeated in 6 months. He reports good compliance with treatment. He is not having side effects.  He is exercising. He is adherent to low salt diet.   Outside blood pressures are 120/60's. He is experiencing none.  Patient denies chest pain, chest pressure/discomfort, claudication, dyspnea, exertional chest pressure/discomfort, fatigue, irregular heart beat, lower extremity edema, near-syncope, orthopnea, palpitations, paroxysmal nocturnal dyspnea, syncope and tachypnea.   Cardiovascular risk factors include advanced age (older than 71 for men, 90 for women), hypertension and male gender.  Use of agents associated with hypertension: NSAIDS.     Weight trend: fluctuating a bit Wt Readings from Last 3 Encounters:  10/10/15 205 lb (93 kg)  09/26/15 200 lb (90.7 kg)  08/09/15 213 lb (96.6 kg)    Current diet: well balanced  He feels great. Is hiking most weekends, doing 25 push ups a day. No chest pain or dyspnea. Does not currently have any follow up scheduled with vascular.  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 Plan was to repeat CTA in one year. Patient states he thinks he has follow up at [facility] in January, but is not sure.     Allergies  Allergen Reactions  . Penicillins Hives and Nausea Only    Has patient had a PCN reaction causing immediate rash, facial/tongue/throat swelling, SOB or lightheadedness with hypotension: Hives Has patient had a PCN reaction causing severe rash involving mucus membranes or skin necrosis: No Has patient had a PCN reaction that required hospitalization No Has patient had a PCN reaction occurring within the last 10 years: No If all of the above answers are "NO", then may proceed with Cephalosporin use.     Current Outpatient Prescriptions:  .  amLODipine (NORVASC) 5 MG tablet, TAKE ONE TABLET TWICE DAILY, Disp: 60 tablet, Rfl: 12 .  aspirin 81 MG tablet, Take 81 mg by mouth daily., Disp: , Rfl:  .  calcium carbonate 200 MG capsule, Take 250 mg by mouth daily. , Disp: , Rfl:  .  carvedilol (COREG) 25 MG tablet, TAKE ONE TABLET EVERY 12 HOURS, Disp: 60 tablet, Rfl: 12 .  pravastatin (PRAVACHOL) 80 MG tablet, TAKE ONE TABLET AT BEDTIME, Disp: 30 tablet, Rfl: 12 .  warfarin (COUMADIN) 10 MG tablet, TAKE 1 TO 2 TABLETS AS DIRECTED, Disp: 60 tablet, Rfl: 5  Review of Systems  Constitutional: Negative for appetite change, chills and fever.  Eyes: Negative for photophobia, pain, discharge, redness, itching and visual disturbance.  Respiratory: Negative for chest tightness, shortness of breath and wheezing.   Cardiovascular: Negative for  chest pain and palpitations.  Gastrointestinal: Negative for abdominal pain, nausea and vomiting.    Social History  Substance Use Topics  . Smoking status: Never Smoker  . Smokeless tobacco: Never Used  . Alcohol use 0.0 oz/week     Comment: occasional   Objective:   BP 124/60 (BP Location: Right Arm, Patient Position: Sitting, Cuff Size: Large)   Pulse (!) 54   Temp 97.9 F (36.6  C)   Resp 16   Wt 209 lb (94.8 kg)   SpO2 97% Comment: room air  BMI 29.99 kg/m   Physical Exam   General Appearance:    Alert, cooperative, no distress  Eyes:    PERRL, conjunctiva/corneas clear, EOM's intact       Lungs:     Clear to auscultation bilaterally, respirations unlabored  Heart:    Regular rate and rhythm  Neurologic:   Awake, alert, oriented x 3. No apparent focal neurological           defect.           Assessment & Plan:     1. Need for influenza vaccination  - Flu vaccine HIGH DOSE PF (Fluzone High dose)  2. Special screening for malignant neoplasms, colon  - Cologuard  3. Screening for malignant neoplasm of the rectum  - Cologuard  4. Essential hypertension Well controlled.  Continue current medications.    5. CAD in native artery Asymptomatic. Continue current medications. Last seen by Dr. Rockey Situ 09/2015  6. Vertebral artery aneurysm [facility]) He will check to see if has follow up scheduled at [facility], if not will need to get CTA here before end of year.        Lelon Huh, MD  [facility]

## 2015-12-27 DIAGNOSIS — Z1211 Encounter for screening for malignant neoplasm of colon: Secondary | ICD-10-CM | POA: Diagnosis not present

## 2015-12-27 DIAGNOSIS — Z1212 Encounter for screening for malignant neoplasm of rectum: Secondary | ICD-10-CM | POA: Diagnosis not present

## 2015-12-27 LAB — COLOGUARD: Cologuard: NEGATIVE

## 2016-01-10 ENCOUNTER — Ambulatory Visit (INDEPENDENT_AMBULATORY_CARE_PROVIDER_SITE_OTHER): Payer: Managed Care, Other (non HMO)

## 2016-01-10 DIAGNOSIS — Z952 Presence of prosthetic heart valve: Secondary | ICD-10-CM | POA: Diagnosis not present

## 2016-01-10 LAB — POCT INR
INR: 3.2
PT: 38.3

## 2016-01-10 NOTE — Patient Instructions (Signed)
Anticoagulation Dose Instructions as of 01/10/2016      Dorene Grebe Tue Wed Thu Fri Sat   New Dose 10 mg 5 mg 10 mg 10 mg 10 mg 5 mg 10 mg    Description   Take 10 mg daily except 5 mg on M, and F, F/U 4 weeks

## 2016-02-07 ENCOUNTER — Ambulatory Visit (INDEPENDENT_AMBULATORY_CARE_PROVIDER_SITE_OTHER): Payer: Managed Care, Other (non HMO)

## 2016-02-07 DIAGNOSIS — Z952 Presence of prosthetic heart valve: Secondary | ICD-10-CM | POA: Diagnosis not present

## 2016-02-07 LAB — POCT INR
INR: 3.1
PT: 37.6

## 2016-02-07 NOTE — Patient Instructions (Signed)
Anticoagulation Dose Instructions as of 02/07/2016      Jason Mcdaniel Tue Wed Thu Fri Sat   New Dose 10 mg 5 mg 10 mg 10 mg 10 mg 5 mg 10 mg    Description   Take 10 mg daily except 5 mg on M, and F, F/U 4 weeks

## 2016-03-06 ENCOUNTER — Ambulatory Visit (INDEPENDENT_AMBULATORY_CARE_PROVIDER_SITE_OTHER): Payer: Managed Care, Other (non HMO)

## 2016-03-06 DIAGNOSIS — Z952 Presence of prosthetic heart valve: Secondary | ICD-10-CM | POA: Diagnosis not present

## 2016-03-06 LAB — POCT INR
INR: 3.4
PT: 40.7

## 2016-03-06 NOTE — Patient Instructions (Signed)
INR as of 03/06/2016 and Previous Dosing Information    INR Dt INR Goal Jason Mcdaniel Sun Mon Tue Wed Thu Fri Sat   03/06/2016 3.4 2.5-3.5 60 mg 10 mg 5 mg 10 mg 10 mg 10 mg 5 mg 10 mg    Previous description   Take 10 mg daily except 5 mg on M, and F, F/U 4 weeks   Anticoagulation Dose Instructions as of 03/06/2016      Total Sun Mon Tue Wed Thu Fri Sat   New Dose 60 mg 10 mg 5 mg 10 mg 10 mg 10 mg 5 mg 10 mg     -  (5 mg x 1)  -  -  -  (5 mg x 1)  -      (10 mg x 1)  -  (10 mg x 1)  (10 mg x 1)  (10 mg x 1)  -  (10 mg x 1)                         Description   Take 10 mg daily except 5 mg on M, and F, F/U 4 weeks

## 2016-04-03 ENCOUNTER — Ambulatory Visit: Payer: Self-pay

## 2016-04-12 ENCOUNTER — Ambulatory Visit (INDEPENDENT_AMBULATORY_CARE_PROVIDER_SITE_OTHER): Payer: Managed Care, Other (non HMO)

## 2016-04-12 DIAGNOSIS — Z952 Presence of prosthetic heart valve: Secondary | ICD-10-CM

## 2016-04-12 LAB — POCT INR
INR: 2.4
PT: 29.2

## 2016-04-12 NOTE — Patient Instructions (Signed)
INR as of 04/12/2016 and Previous Dosing Information    INR Dt INR Goal Jason Mcdaniel Sun Mon Tue Wed Thu Fri Sat   04/12/2016 2.4 2.5-3.5 60 mg 10 mg 5 mg 10 mg 10 mg 10 mg 5 mg 10 mg    Previous description   Take 10 mg daily except 5 mg on M, and F, F/U 4 weeks   Anticoagulation Dose Instructions as of 04/12/2016      Total Sun Mon Tue Wed Thu Fri Sat   New Dose 60 mg 10 mg 5 mg 10 mg 10 mg 10 mg 5 mg 10 mg     -  (5 mg x 1)  -  -  -  (5 mg x 1)  -      (10 mg x 1)  -  (10 mg x 1)  (10 mg x 1)  (10 mg x 1)  -  (10 mg x 1)                         Description   Take 10 mg daily except 5 mg on M, and F, F/U 4 weeks

## 2016-05-08 ENCOUNTER — Ambulatory Visit (INDEPENDENT_AMBULATORY_CARE_PROVIDER_SITE_OTHER): Payer: Managed Care, Other (non HMO)

## 2016-05-08 DIAGNOSIS — Z952 Presence of prosthetic heart valve: Secondary | ICD-10-CM

## 2016-05-08 LAB — POCT INR
INR: 1.7
PT: 20.3

## 2016-05-08 NOTE — Patient Instructions (Signed)
Anticoagulation Dose Instructions as of 05/08/2016      Dorene Grebe Tue Wed Thu Fri Sat   New Dose 10 mg 5 mg 10 mg 10 mg 10 mg 10 mg 10 mg    Description   Take 10 mg daily except 5 mg on Monday, F/U 4 weeks

## 2016-05-11 ENCOUNTER — Other Ambulatory Visit: Payer: Self-pay | Admitting: Family Medicine

## 2016-06-05 ENCOUNTER — Ambulatory Visit (INDEPENDENT_AMBULATORY_CARE_PROVIDER_SITE_OTHER): Payer: Managed Care, Other (non HMO)

## 2016-06-05 DIAGNOSIS — Z952 Presence of prosthetic heart valve: Secondary | ICD-10-CM | POA: Diagnosis not present

## 2016-06-05 LAB — POCT INR
INR: 1.6
PT: 19.8

## 2016-06-05 NOTE — Patient Instructions (Signed)
Anticoagulation Dose Instructions as of 06/05/2016      Jason Mcdaniel Tue Wed Thu Fri Sat   New Dose 10 mg 5 mg 10 mg 10 mg 10 mg 10 mg 10 mg    Description   Take 10 mg daily except 5 mg on Monday, F/U 1 weeks

## 2016-06-07 DIAGNOSIS — J349 Unspecified disorder of nose and nasal sinuses: Secondary | ICD-10-CM | POA: Diagnosis not present

## 2016-06-07 DIAGNOSIS — R040 Epistaxis: Secondary | ICD-10-CM | POA: Diagnosis not present

## 2016-06-12 ENCOUNTER — Ambulatory Visit (INDEPENDENT_AMBULATORY_CARE_PROVIDER_SITE_OTHER): Payer: Managed Care, Other (non HMO)

## 2016-06-12 DIAGNOSIS — Z952 Presence of prosthetic heart valve: Secondary | ICD-10-CM | POA: Diagnosis not present

## 2016-06-12 LAB — POCT INR
INR: 4
PT: 47.8

## 2016-06-12 NOTE — Progress Notes (Signed)
Anticoagulation Dose Instructions as of 06/12/2016      Jason Mcdaniel Tue Wed Thu Fri Sat   New Dose 10 mg 0 mg 10 mg Hold Hold 0 mg 10 mg    Description   Hold for 2 days. Then take 10 mg daily except 5 mg on Monday, Wednesday, and Friday  F/U 3 weeks

## 2016-07-03 ENCOUNTER — Ambulatory Visit: Payer: Managed Care, Other (non HMO)

## 2016-08-06 ENCOUNTER — Ambulatory Visit (INDEPENDENT_AMBULATORY_CARE_PROVIDER_SITE_OTHER): Payer: Managed Care, Other (non HMO) | Admitting: Family Medicine

## 2016-08-06 ENCOUNTER — Encounter: Payer: Self-pay | Admitting: Family Medicine

## 2016-08-06 ENCOUNTER — Ambulatory Visit (INDEPENDENT_AMBULATORY_CARE_PROVIDER_SITE_OTHER): Payer: Managed Care, Other (non HMO)

## 2016-08-06 VITALS — BP 136/60 | HR 60 | Temp 97.9°F | Ht 70.0 in | Wt 210.2 lb

## 2016-08-06 DIAGNOSIS — I2510 Atherosclerotic heart disease of native coronary artery without angina pectoris: Secondary | ICD-10-CM

## 2016-08-06 DIAGNOSIS — Z1159 Encounter for screening for other viral diseases: Secondary | ICD-10-CM | POA: Diagnosis not present

## 2016-08-06 DIAGNOSIS — I712 Thoracic aortic aneurysm, without rupture, unspecified: Secondary | ICD-10-CM

## 2016-08-06 DIAGNOSIS — I7101 Dissection of thoracic aorta: Secondary | ICD-10-CM | POA: Diagnosis not present

## 2016-08-06 DIAGNOSIS — I726 Aneurysm of vertebral artery: Secondary | ICD-10-CM

## 2016-08-06 DIAGNOSIS — Z0000 Encounter for general adult medical examination without abnormal findings: Secondary | ICD-10-CM

## 2016-08-06 DIAGNOSIS — I7123 Aneurysm of the descending thoracic aorta, without rupture: Secondary | ICD-10-CM

## 2016-08-06 DIAGNOSIS — Z125 Encounter for screening for malignant neoplasm of prostate: Secondary | ICD-10-CM | POA: Diagnosis not present

## 2016-08-06 DIAGNOSIS — G4700 Insomnia, unspecified: Secondary | ICD-10-CM | POA: Diagnosis not present

## 2016-08-06 DIAGNOSIS — I10 Essential (primary) hypertension: Secondary | ICD-10-CM | POA: Diagnosis not present

## 2016-08-06 DIAGNOSIS — E785 Hyperlipidemia, unspecified: Secondary | ICD-10-CM

## 2016-08-06 DIAGNOSIS — Z952 Presence of prosthetic heart valve: Secondary | ICD-10-CM

## 2016-08-06 DIAGNOSIS — I71019 Dissection of thoracic aorta, unspecified: Secondary | ICD-10-CM

## 2016-08-06 LAB — POCT INR
INR: 6.2
PT: 74

## 2016-08-06 MED ORDER — TEMAZEPAM 15 MG PO CAPS: 15.0000 mg | ORAL_CAPSULE | Freq: Every evening | ORAL | 0 refills | Status: DC | PRN

## 2016-08-06 NOTE — Progress Notes (Signed)
Subjective:   Jason Mcdaniel is a 71 y.o. male who presents for an Initial Medicare Annual Wellness Visit.  Review of Systems  N/A  Cardiac Risk Factors include: advanced age (>50men, >44 women);dyslipidemia;hypertension;male gender;obesity (BMI >30kg/m2)    Objective:    Today's Vitals   08/06/16 1339  BP: 136/60  Pulse: 60  Temp: 97.9 F (36.6 C)  TempSrc: Oral  Weight: 210 lb 3.2 oz (95.3 kg)  Height: 5\' 10"  (1.778 m)  PainSc: 0-No pain   Body mass index is 30.16 kg/m.  Current Medications (verified) Outpatient Encounter Prescriptions as of 08/06/2016  Medication Sig  . amLODipine (NORVASC) 5 MG tablet TAKE ONE TABLET TWICE DAILY  . aspirin 81 MG tablet Take 81 mg by mouth daily.  . calcium carbonate 200 MG capsule Take 250 mg by mouth daily.   . carvedilol (COREG) 25 MG tablet TAKE ONE TABLET EVERY 12 HOURS  . Coenzyme Q10 (COQ10) 100 MG CAPS Take 1 tablet by mouth daily.  . Cyanocobalamin (RA VITAMIN B-12 TR) 1000 MCG TBCR Take 1,000 mcg by mouth daily.  . pravastatin (PRAVACHOL) 80 MG tablet TAKE ONE TABLET BY MOUTH AT BEDTIME  . warfarin (COUMADIN) 10 MG tablet TAKE 1 TO 2 TABLETS BY MOUTH AS DIRECTED (Patient taking differently: TAKE 1 T TABLETS BY MOUTH AS DIRECTED)  . [DISCONTINUED] Coenzyme Q10 10 MG capsule Take by mouth.  . [DISCONTINUED] Coral Calcium (SM CORAL CALCIUM) 1000 (390 Ca) MG TABS Take by mouth.   No facility-administered encounter medications on file as of 08/06/2016.     Allergies (verified) Penicillins   History: Past Medical History:  Diagnosis Date  . AAA (abdominal aortic aneurysm) ([place])   . 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 Chest pain, unspecified   . Coronary artery disease   . Cough    CHRONIC  . 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 Dysrhythmia   . Edema    FEET/LEGS  . Epistaxis 09/05/2011   07/2011 - Seen by ENT - resolved.  [name] History of aortic valve replacement    a.  St. Jude mechanical valve via right mini thoracotomy by Dr Gerrit Friends [facility] ;  b. 02/2011 Echo EF 55-65%, Gr 2 DD, Triv AI, Mild MR.  [name] History of thoracic aortic aneurysm repair 02/09/2002   straight graft replacement of ascending thoracic aorta for acute type A aortic dissection (DeBakey type II) with hemiarch distal arch anastamosis and supracoronary proximal anastamosis   . HOH (hard of hearing)   . 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 Hyperlipidemia, mixed   . Hypertension    unspecified  . Myocardial infarction ([place]) 2004  . Peripheral vascular disease ([place])   . Stroke ([place]) 1999  . TIA (transient ischemic attack) 09/05/2011   Attributed to antihypertensive medications   Past Surgical History:  Procedure Laterality Date  . ABDOMINAL AORTIC ANEURYSM REPAIR  12/02/11   [facility]  . CARDIAC CATHETERIZATION    . CARDIAC VALVE REPLACEMENT  2001   aortic valve replaced  . CATARACT EXTRACTION W/PHACO Left 09/26/2015   Procedure: CATARACT EXTRACTION PHACO AND INTRAOCULAR LENS PLACEMENT (IOC);  Surgeon: Gwyndolyn Saxon  Porfilio, MD;  Location: [facility] ORS;  Service: Ophthalmology;  Laterality: Left;  [place] 30.0 AP% 23.4 CDE 7.08 Fluid pack lot # [id number]  . CATARACT EXTRACTION W/PHACO Right 10/10/2015   Procedure: CATARACT EXTRACTION PHACO AND INTRAOCULAR LENS PLACEMENT (IOC);  Surgeon: Birder Robson, MD;  Location: [facility] ORS;  Service: Ophthalmology;  Laterality: Right;  [place] 38.4 AP% 18.7 CDE 7.18 Fluid pack lot # [id number]  . VASCULAR SURGERY    . VOCAL CORD INJECTION  2015   Family History  Problem Relation Age of Onset  . Goiter Mother   . Heart attack Father     . Hypertension Father   . Hypertension Sister   . Heart attack Sister   . Heart attack Brother   . Heart attack Brother   . Suicidality Son    Social History   Occupational History  . Retired    Social History Main Topics  . Smoking status: Never Smoker  . Smokeless tobacco: Never Used  . Alcohol use 0.0 - 4.2 oz/week  . Drug use: No  . Sexual activity: Not on file   Tobacco Counseling Counseling given: Not Answered   Activities of Daily Living In your present state of health, do you have any difficulty performing the following activities: 08/06/2016 08/06/2016  Hearing? N N  Vision? N N  Difficulty concentrating or making decisions? Tempie Donning  Walking or climbing stairs? N N  Dressing or bathing? N N  Doing errands, shopping? N N  Preparing Food and eating ? - N  Using the Toilet? - N  In the past six months, have you accidently leaked urine? - N  Do you have problems with loss of bowel control? - N  Managing your Medications? - N  Managing your Finances? - N  Housekeeping or managing your Housekeeping? - N  Some recent data might be hidden    Immunizations and Health Maintenance Immunization History  Administered Date(s) Administered  . Influenza, High Dose Seasonal PF 12/20/2014, 12/20/2015  . Pneumococcal Conjugate-13 05/19/2013  . Pneumococcal Polysaccharide-23 08/25/2009, 11/01/2012  . Td 03/18/1993  . Tdap 01/02/2011  . Zoster 02/15/2010   There are no preventive care reminders to display for this patient.  Patient Care Team: Birdie Sons, MD as PCP - General (Family Medicine) Minna Merritts, MD as Consulting Physician (Cardiology) Anson Crofts, MD as Attending Physician (Cardiothoracic Surgery) Birder Robson, MD as Referring Physician (Ophthalmology)  Indicate any recent Medical Services you may have received from other than [facility] providers in the past year (date may be approximate).    Assessment:   This is a routine wellness  examination for Jason Mcdaniel.   Hearing/Vision screen Vision Screening Comments: Pt sees Dr George Ina for vision checks once yearly.  Dietary issues and exercise activities discussed: Current Exercise Habits: Home exercise routine, Type of exercise: walking, Time (Minutes): 40 (2-3 miles a day, some karate), Frequency (Times/Week): 7, Weekly Exercise (Minutes/Week): 280, Intensity: Mild, Exercise limited by: None identified  Goals    . Increase water intake          Recommend increasing water intake to 6 glasses every day.      Depression Screen PHQ 2/9 Scores 08/06/2016 08/09/2015 08/09/2015  PHQ - 2 Score 0 0 0  PHQ- 9 Score 3 - -    Fall Risk Fall Risk  08/06/2016 08/09/2015 08/09/2015  Falls in the past year? No No No    Cognitive Function:     6CIT Screen 08/06/2016  What  Year? 0 points  What month? 0 points  What time? 0 points  Count back from 20 0 points  Months in reverse 2 points  Repeat phrase 8 points  Total Score 10    Screening Tests Health Maintenance  Topic Date Due  . INFLUENZA VACCINE  10/16/2016  . Fecal DNA (Cologuard)  12/27/2018  . TETANUS/TDAP  01/01/2021  . Hepatitis C Screening  Completed  . PNA vac Low Risk Adult  Completed        Plan:  I have personally reviewed and addressed the Medicare Annual Wellness questionnaire and have noted the following in the patient's chart:  A. Medical and social history B. Use of alcohol, tobacco or illicit drugs  C. Current medications and supplements D. Functional ability and status E.  Nutritional status F.  Physical activity G. Advance directives H. List of other physicians I.  Hospitalizations, surgeries, and ER visits in previous 12 months J.  [place] such as hearing and vision if needed, cognitive and depression L. Referrals and appointments - none  In addition, I have reviewed and discussed with patient certain preventive protocols, quality metrics, and best practice recommendations. A  written personalized care plan for preventive services as well as general preventive health recommendations were provided to patient.  See attached scanned questionnaire for additional information.   Signed,  Fabio Neighbors, LPN Nurse Health Advisor   MD Recommendations: None.   I have reviewed the health advisor's note, was available for consultation, and agree with documentation and plan  Lelon Huh, MD

## 2016-08-06 NOTE — Addendum Note (Signed)
Addended by: Julieta Bellini on: 08/06/2016 02:59 PM   Modules accepted: Orders, Level of Service

## 2016-08-06 NOTE — Patient Instructions (Addendum)
Jason Mcdaniel , Thank you for taking time to come for your Medicare Wellness Visit. I appreciate your ongoing commitment to your health goals. Please review the following plan we discussed and let me know if I can assist you in the future.   Screening recommendations/referrals: Colonoscopy: cologuard completed 12/27/15, due 12/27/18 Recommended yearly ophthalmology/optometry visit for glaucoma screening and checkup Recommended yearly dental visit for hygiene and checkup  Vaccinations: Influenza vaccine: up to date, due 11/2016 Pneumococcal vaccine: completed series Tdap vaccine: completed 01/02/11, due 12/2020 Shingles vaccine: completed 02/15/10  Advanced directives: Advanced directive already on file.  Conditions/risks identified: Recommend increasing water intake to 6 glasses every day.  Next appointment: None, need to schedule 1 year AWV.  Preventive Care 11 Years and Older, Male Preventive care refers to lifestyle choices and visits with your health care provider that can promote health and wellness. What does preventive care include?  A yearly physical exam. This is also called an annual well check.  Dental exams once or twice a year.  Routine eye exams. Ask your health care provider how often you should have your eyes checked.  Personal lifestyle choices, including:  Daily care of your teeth and gums.  Regular physical activity.  Eating a healthy diet.  Avoiding tobacco and drug use.  Limiting alcohol use.  Practicing safe sex.  Taking low doses of aspirin every day.  Taking vitamin and mineral supplements as recommended by your health care provider. What happens during an annual well check? The services and screenings done by your health care provider during your annual well check will depend on your age, overall health, lifestyle risk factors, and family history of disease. Counseling  Your health care provider may ask you questions about your:  Alcohol  use.  Tobacco use.  Drug use.  Emotional well-being.  Home and relationship well-being.  Sexual activity.  Eating habits.  History of falls.  Memory and ability to understand (cognition).  Work and work Statistician. Screening  You may have the following tests or measurements:  Height, weight, and BMI.  Blood pressure.  Lipid and cholesterol levels. These may be checked every 5 years, or more frequently if you are over 12 years old.  Skin check.  Lung cancer screening. You may have this screening every year starting at age 59 if you have a 30-pack-year history of smoking and currently smoke or have quit within the past 15 years.  Fecal occult blood test (FOBT) of the stool. You may have this test every year starting at age 28.  Flexible sigmoidoscopy or colonoscopy. You may have a sigmoidoscopy every 5 years or a colonoscopy every 10 years starting at age 57.  Prostate cancer screening. Recommendations will vary depending on your family history and other risks.  Hepatitis C blood test.  Hepatitis B blood test.  Sexually transmitted disease (STD) testing.  Diabetes screening. This is done by checking your blood sugar (glucose) after you have not eaten for a while (fasting). You may have this done every 1-3 years.  Abdominal aortic aneurysm (AAA) screening. You may need this if you are a current or former smoker.  Osteoporosis. You may be screened starting at age 17 if you are at high risk. Talk with your health care provider about your test results, treatment options, and if necessary, the need for more tests. Vaccines  Your health care provider may recommend certain vaccines, such as:  Influenza vaccine. This is recommended every year.  Tetanus, diphtheria, and acellular pertussis (Tdap,  Td) vaccine. You may need a Td booster every 10 years.  Zoster vaccine. You may need this after age 41.  Pneumococcal 13-valent conjugate (PCV13) vaccine. One dose is  recommended after age 96.  Pneumococcal polysaccharide (PPSV23) vaccine. One dose is recommended after age 6. Talk to your health care provider about which screenings and vaccines you need and how often you need them. This information is not intended to replace advice given to you by your health care provider. Make sure you discuss any questions you have with your health care provider. Document Released: 03/31/2015 Document Revised: 11/22/2015 Document Reviewed: 01/03/2015 Elsevier Interactive Patient Education  2017 [place] Prevention in the Home Falls can cause injuries. They can happen to people of all ages. There are many things you can do to make your home safe and to help prevent falls. What can I do on the outside of my home?  Regularly fix the edges of walkways and driveways and fix any cracks.  Remove anything that might make you trip as you walk through a door, such as a raised step or threshold.  Trim any bushes or trees on the path to your home.  Use bright outdoor lighting.  Clear any walking paths of anything that might make someone trip, such as rocks or tools.  Regularly check to see if handrails are loose or broken. Make sure that both sides of any steps have handrails.  Any raised decks and porches should have guardrails on the edges.  Have any leaves, snow, or ice cleared regularly.  Use sand or salt on walking paths during winter.  Clean up any spills in your garage right away. This includes oil or grease spills. What can I do in the bathroom?  Use night lights.  Install grab bars by the toilet and in the tub and shower. Do not use towel bars as grab bars.  Use non-skid mats or decals in the tub or shower.  If you need to sit down in the shower, use a plastic, non-slip stool.  Keep the floor dry. Clean up any water that spills on the floor as soon as it happens.  Remove soap buildup in the tub or shower regularly.  Attach bath mats  securely with double-sided non-slip rug tape.  Do not have throw rugs and other things on the floor that can make you trip. What can I do in the bedroom?  Use night lights.  Make sure that you have a light by your bed that is easy to reach.  Do not use any sheets or blankets that are too big for your bed. They should not hang down onto the floor.  Have a firm chair that has side arms. You can use this for support while you get dressed.  Do not have throw rugs and other things on the floor that can make you trip. What can I do in the kitchen?  Clean up any spills right away.  Avoid walking on wet floors.  Keep items that you use a lot in easy-to-reach places.  If you need to reach something above you, use a strong step stool that has a grab bar.  Keep electrical cords out of the way.  Do not use floor polish or wax that makes floors slippery. If you must use wax, use non-skid floor wax.  Do not have throw rugs and other things on the floor that can make you trip. What can I do with my stairs?  Do not  leave any items on the stairs.  Make sure that there are handrails on both sides of the stairs and use them. Fix handrails that are broken or loose. Make sure that handrails are as long as the stairways.  Check any carpeting to make sure that it is firmly attached to the stairs. Fix any carpet that is loose or worn.  Avoid having throw rugs at the top or bottom of the stairs. If you do have throw rugs, attach them to the floor with carpet tape.  Make sure that you have a light switch at the top of the stairs and the bottom of the stairs. If you do not have them, ask someone to add them for you. What else can I do to help prevent falls?  Wear shoes that:  Do not have high heels.  Have rubber bottoms.  Are comfortable and fit you well.  Are closed at the toe. Do not wear sandals.  If you use a stepladder:  Make sure that it is fully opened. Do not climb a closed  stepladder.  Make sure that both sides of the stepladder are locked into place.  Ask someone to hold it for you, if possible.  Clearly mark and make sure that you can see:  Any grab bars or handrails.  First and last steps.  Where the edge of each step is.  Use tools that help you move around (mobility aids) if they are needed. These include:  Canes.  Walkers.  Scooters.  Crutches.  Turn on the lights when you go into a dark area. Replace any light bulbs as soon as they burn out.  Set up your furniture so you have a clear path. Avoid moving your furniture around.  If any of your floors are uneven, fix them.  If there are any pets around you, be aware of where they are.  Review your medicines with your doctor. Some medicines can make you feel dizzy. This can increase your chance of falling. Ask your doctor what other things that you can do to help prevent falls. This information is not intended to replace advice given to you by your health care provider. Make sure you discuss any questions you have with your health care provider. Document Released: 12/29/2008 Document Revised: 08/10/2015 Document Reviewed: 04/08/2014 Elsevier Interactive Patient Education  2017 [employer].

## 2016-08-06 NOTE — Patient Instructions (Addendum)
Hold coumadin for 3 days. Then take 10 mg Monday and Thursday. Take 5 mg Sunday, Tuesday, Wednesday, Friday, Saturday.

## 2016-08-06 NOTE — Progress Notes (Signed)
Patient: Jason Mcdaniel Male    DOB: 08/04/1945   71 y.o.   MRN: [id number] Visit Date: 08/06/2016  Today's Provider: Lelon Huh, MD   Chief Complaint  Patient presents with  . Follow-up  . Hypertension  . Asthma   Subjective:    HPI  Annual physical exam.  He is generally doing well. Still walking every day, going on bike trips. Swims and does push ups occasionally. States he feels well.    Hypertension, follow-up:  BP Readings from Last 3 Encounters:  08/06/16 136/60  12/20/15 124/60  10/10/15 (!) 144/77    He was last seen for hypertension 7 months ago.  BP at that visit was 124/60. Management since that visit includes; no changes.He reports good compliance with treatment. He is not having side effects. none He is exercising. He is adherent to low salt diet.   Outside blood pressures are normal. He is experiencing none.  Patient denies none.   Cardiovascular risk factors include none.  Use of agents associated with hypertension: none.   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   Follow up thoracic aortic aneurysm.  Last visit with CT surgery at [facility] was April 2015 and recommended follow up 16 months. he had follow up with Dr. Rockey Situ in July 2016 at [facility] time follow up CTA of chest was performed and found to be stable. he has follow up with CT surgery and CTA at [facility] On June 22nd.      Allergies  Allergen Reactions  . Penicillins Hives and Nausea Only    Has patient had a PCN reaction causing immediate rash, facial/tongue/throat swelling, SOB or lightheadedness with hypotension: Hives Has patient had a PCN reaction causing severe rash involving mucus membranes or skin necrosis: No Has patient had a PCN reaction that required hospitalization No Has patient had a PCN reaction  occurring within the last 10 years: No If all of the above answers are "NO", then may proceed with Cephalosporin use.     Current Outpatient Prescriptions:  .  amLODipine (NORVASC) 5 MG tablet, TAKE ONE TABLET TWICE DAILY, Disp: 60 tablet, Rfl: 12 .  aspirin 81 MG tablet, Take 81 mg by mouth daily., Disp: , Rfl:  .  calcium carbonate 200 MG capsule, Take 250 mg by mouth daily. , Disp: , Rfl:  .  carvedilol (COREG) 25 MG tablet, TAKE ONE TABLET EVERY 12 HOURS, Disp: 60 tablet, Rfl: 12 .  Coenzyme Q10 (COQ10) 100 MG CAPS, Take 1 tablet by mouth daily., Disp: , Rfl:  .  Cyanocobalamin (RA VITAMIN B-12 TR) 1000 MCG TBCR, Take 1,000 mcg by mouth daily., Disp: , Rfl:  .  pravastatin (PRAVACHOL) 80 MG tablet, TAKE ONE TABLET BY MOUTH AT BEDTIME, Disp: 30 tablet, Rfl: 5 .  warfarin (COUMADIN) 10 MG tablet, TAKE 1 TO 2 TABLETS BY MOUTH AS DIRECTED (Patient taking differently: TAKE 1 T TABLETS BY MOUTH AS DIRECTED), Disp: 60 tablet, Rfl: 5  Review of Systems  Constitutional: Negative for chills, diaphoresis and fever.  HENT: Negative for congestion, ear discharge, ear pain, hearing loss, nosebleeds, sore throat and tinnitus.   Eyes: Negative for photophobia, pain, discharge and redness.  Respiratory: Negative for cough, shortness of breath, wheezing and stridor.   Cardiovascular: Negative for chest pain, palpitations and leg swelling.  Gastrointestinal: Negative for abdominal pain, blood in stool,  constipation, diarrhea, nausea and vomiting.  Endocrine: Negative for polydipsia.  Genitourinary: Negative for dysuria, flank pain, frequency, hematuria and urgency.  Musculoskeletal: Negative for back pain, myalgias and neck pain.  Skin: Negative for rash.  Allergic/Immunologic: Negative for environmental allergies.  Neurological: Negative for dizziness, tremors, seizures, weakness and headaches.  Hematological: Does not bruise/bleed easily.  Psychiatric/Behavioral: Negative for hallucinations and  suicidal ideas. The patient is not nervous/anxious.   All other systems reviewed and are negative.   Social History  Substance Use Topics  . Smoking status: Never Smoker  . Smokeless tobacco: Never Used  . Alcohol use 0.0 - 4.2 oz/week   Objective:    Vital Signs - Last Recorded  Most recent update: 08/06/2016 1:45 PM by Fabio Neighbors, LPN  BP  321/22 (BP Location: Right Arm)     Pulse  60     Temp  97.9 F (36.6 C) (Oral)     Ht  5\' 10"  (1.778 m)     Wt  210 lb 3.2 oz (95.3 kg)      BMI  30.16 kg/m    BMI and BSA Data   Body Mass Index: 30.16 kg/m Body Surface Area: 2.17 m      Physical Exam   General Appearance:    Alert, cooperative, no distress, appears stated age  Head:    Normocephalic, without obvious abnormality, atraumatic  Eyes:    PERRL, conjunctiva/corneas clear, EOM's intact, fundi    benign, both eyes       Ears:    Normal TM's and external ear canals, both ears  Nose:   Nares normal, septum midline, mucosa normal, no drainage   or sinus tenderness  Throat:   Lips, mucosa, and tongue normal; teeth and gums normal  Neck:   Supple, symmetrical, trachea midline, no adenopathy;       thyroid:  No enlargement/tenderness/nodules; no carotid   bruit or JVD  Back:     Symmetric, no curvature, ROM normal, no CVA tenderness  Lungs:     Clear to auscultation bilaterally, respirations unlabored  Chest wall:    No tenderness or deformity  Heart:    Regular rate and rhythm, S1 and S2 normal, no murmur, rub   or gallop  Abdomen:     Soft, non-tender, bowel sounds active all four quadrants,    no masses, no organomegaly  Genitalia:    deferred  Rectal:    deferred  Extremities:   Extremities normal, atraumatic, no cyanosis or edema  Pulses:   2+ and symmetric all extremities  Skin:   Skin color, texture, turgor normal, no rashes or lesions  Lymph nodes:   Cervical, supraclavicular, and axillary nodes normal  Neurologic:   CNII-XII intact.  Normal strength, sensation and reflexes      throughout       Assessment & Plan:     1. Annual physical exam Doing well.   2. 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 Essential hypertension Well controlled.  Continue current medications.    4. Dissecting aortic aneurysm, thoracic (HCC) Asymptomatic. Compliant with medication.  Continue aggressive risk factor modification.    5. 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 CAD in native artery States he will be seeing Dr. Rockey Situ later this year.   7. Hyperlipidemia, unspecified hyperlipidemia type He is tolerating pravastatin well with no adverse effects.   - Comprehensive metabolic panel - Lipid panel  8. Prostate cancer screening  - PSA  9. Insomnia, unspecified type He states he occasionally has trouble sleeping due to stress about politics. He states temazepam prescribed several years ago worked well, but he is now out. Refilled temazepam at lower 15mg  dose today.        Lelon Huh, MD  [facility]

## 2016-08-09 ENCOUNTER — Telehealth: Payer: Self-pay

## 2016-08-09 LAB — COMPREHENSIVE METABOLIC PANEL
ALT: 14 IU/L (ref 0–44)
AST: 19 IU/L (ref 0–40)
Albumin/Globulin Ratio: 1.8 (ref 1.2–2.2)
Albumin: 4.3 g/dL (ref 3.5–4.8)
Alkaline Phosphatase: 78 IU/L (ref 39–117)
BUN/Creatinine Ratio: 21 (ref 10–24)
BUN: 22 mg/dL (ref 8–27)
Bilirubin Total: 0.5 mg/dL (ref 0.0–1.2)
CALCIUM: 9.2 mg/dL (ref 8.6–10.2)
CO2: 24 mmol/L (ref 18–29)
CREATININE: 1.04 mg/dL (ref 0.76–1.27)
Chloride: 103 mmol/L (ref 96–106)
GFR, EST AFRICAN AMERICAN: 83 mL/min/{1.73_m2} (ref >59)
GFR, EST NON AFRICAN AMERICAN: 71 mL/min/{1.73_m2} (ref >59)
GLUCOSE: 96 mg/dL (ref 65–99)
Globulin, Total: 2.4 g/dL (ref 1.5–4.5)
Potassium: 4.5 mmol/L (ref 3.5–5.2)
Sodium: 141 mmol/L (ref 134–144)
TOTAL PROTEIN: 6.7 g/dL (ref 6.0–8.5)

## 2016-08-09 LAB — HEPATITIS C ANTIBODY: Hep C Virus Ab: <0.1 s/co ratio (ref 0.0–0.9)

## 2016-08-09 LAB — LIPID PANEL
CHOL/HDL RATIO: 4.7 ratio (ref 0.0–5.0)
Cholesterol, Total: 183 mg/dL (ref 100–199)
HDL: 39 mg/dL — AB (ref >39)
LDL Calculated: 114 mg/dL — ABNORMAL HIGH (ref 0–99)
Triglycerides: 150 mg/dL — ABNORMAL HIGH (ref 0–149)
VLDL CHOLESTEROL CAL: 30 mg/dL (ref 5–40)

## 2016-08-09 LAB — PSA: Prostate Specific Ag, Serum: 1.8 ng/mL (ref 0.0–4.0)

## 2016-08-09 NOTE — Telephone Encounter (Signed)
-----   Message from Birdie Sons, MD sent at 08/09/2016  7:50 AM EDT ----- PSA, blood sugar, kidney functions, electrolytes and cholesterol are all normal. Continue current medications.  Check labs yearly.

## 2016-08-09 NOTE — Telephone Encounter (Signed)
Left message to call back  

## 2016-08-14 NOTE — Telephone Encounter (Signed)
Advised patient of results.  

## 2016-08-16 ENCOUNTER — Ambulatory Visit: Payer: Managed Care, Other (non HMO)

## 2016-08-21 ENCOUNTER — Ambulatory Visit: Admission: RE | Admit: 2016-08-21 | Payer: Managed Care, Other (non HMO) | Source: Ambulatory Visit

## 2016-08-21 ENCOUNTER — Ambulatory Visit: Payer: Managed Care, Other (non HMO)

## 2016-08-27 ENCOUNTER — Ambulatory Visit
Admission: RE | Admit: 2016-08-27 | Discharge: 2016-08-27 | Disposition: A | Discharge status: Home / Self Care | Payer: Managed Care, Other (non HMO) | Source: Ambulatory Visit | Attending: Family Medicine | Admitting: Family Medicine

## 2016-08-27 ENCOUNTER — Ambulatory Visit (INDEPENDENT_AMBULATORY_CARE_PROVIDER_SITE_OTHER): Payer: Managed Care, Other (non HMO)

## 2016-08-27 ENCOUNTER — Ambulatory Visit: Admission: RE | Admit: 2016-08-27 | Payer: Managed Care, Other (non HMO) | Source: Ambulatory Visit

## 2016-08-27 DIAGNOSIS — R930 Abnormal findings on diagnostic imaging of skull and head, not elsewhere classified: Secondary | ICD-10-CM | POA: Insufficient documentation

## 2016-08-27 DIAGNOSIS — I672 Cerebral atherosclerosis: Secondary | ICD-10-CM | POA: Diagnosis not present

## 2016-08-27 DIAGNOSIS — Z952 Presence of prosthetic heart valve: Secondary | ICD-10-CM

## 2016-08-27 DIAGNOSIS — I726 Aneurysm of vertebral artery: Secondary | ICD-10-CM | POA: Diagnosis present

## 2016-08-27 DIAGNOSIS — I6509 Occlusion and stenosis of unspecified vertebral artery: Secondary | ICD-10-CM | POA: Insufficient documentation

## 2016-08-27 DIAGNOSIS — I6501 Occlusion and stenosis of right vertebral artery: Secondary | ICD-10-CM | POA: Diagnosis not present

## 2016-08-27 LAB — POCT INR
INR: 3.7
PT: 44.1

## 2016-08-27 MED ORDER — IOPAMIDOL (ISOVUE-370) INJECTION 76%
75.0000 mL | Freq: Once | INTRAVENOUS | Status: AC | PRN
  Administered 2016-08-27: 75 mL via INTRAVENOUS

## 2016-08-27 NOTE — Patient Instructions (Signed)
Hold for two days. Then restart 10mg  Monday and Thursday and 5mg  the rest of the week.  Recheck in two weeks.

## 2016-09-06 DIAGNOSIS — Z9889 Other specified postprocedural states: Secondary | ICD-10-CM | POA: Diagnosis not present

## 2016-09-06 DIAGNOSIS — I712 Thoracic aortic aneurysm, without rupture: Secondary | ICD-10-CM | POA: Diagnosis not present

## 2016-09-06 DIAGNOSIS — Z8679 Personal history of other diseases of the circulatory system: Secondary | ICD-10-CM | POA: Diagnosis not present

## 2016-09-06 DIAGNOSIS — I7103 Dissection of thoracoabdominal aorta: Secondary | ICD-10-CM | POA: Diagnosis not present

## 2016-09-06 DIAGNOSIS — Z952 Presence of prosthetic heart valve: Secondary | ICD-10-CM | POA: Diagnosis not present

## 2016-09-06 DIAGNOSIS — Z09 Encounter for follow-up examination after completed treatment for conditions other than malignant neoplasm: Secondary | ICD-10-CM | POA: Diagnosis not present

## 2016-09-11 ENCOUNTER — Ambulatory Visit (INDEPENDENT_AMBULATORY_CARE_PROVIDER_SITE_OTHER): Payer: Managed Care, Other (non HMO)

## 2016-09-11 DIAGNOSIS — Z952 Presence of prosthetic heart valve: Secondary | ICD-10-CM

## 2016-09-11 LAB — POCT INR
INR: 3.8
PT: 45.4

## 2016-09-11 NOTE — Patient Instructions (Signed)
Anticoagulation Warfarin Dose Instructions as of 09/11/2016      Jason Mcdaniel Tue Wed Thu Fri Sat   New Dose 10 mg 5 mg 10 mg 5 mg 10 mg 5 mg 10 mg    Description   HT:[id number] Heart Valve Current dose: 5mg  on Monday and Thursday, then 10mg  the rest of the week PT: 45.4 INR:3.8 Today's changes: Hold today's dose. Take 5mg  on Mon, Wed and Friday, then 10mg  the rest of the week  Recheck: 2 weeks

## 2016-09-25 ENCOUNTER — Ambulatory Visit (INDEPENDENT_AMBULATORY_CARE_PROVIDER_SITE_OTHER): Payer: Managed Care, Other (non HMO)

## 2016-09-25 DIAGNOSIS — Z952 Presence of prosthetic heart valve: Secondary | ICD-10-CM | POA: Diagnosis not present

## 2016-09-25 LAB — POCT INR
INR: 3.6
PT: 42.7

## 2016-09-25 NOTE — Patient Instructions (Signed)
Anticoagulation Warfarin Dose Instructions as of 09/25/2016      Jason Mcdaniel Tue Wed Thu Fri Sat   New Dose 5 mg 10 mg 5 mg 10 mg 5 mg 10 mg 5 mg    Description   Change to 5 mg daily except for 10 mg on M, W and F F/U 2 weeks

## 2016-10-03 ENCOUNTER — Other Ambulatory Visit: Payer: Self-pay | Admitting: Family Medicine

## 2016-10-03 MED ORDER — PRAVASTATIN SODIUM 80 MG PO TABS: 80.0000 mg | ORAL_TABLET | Freq: Every day | ORAL | 11 refills | Status: DC

## 2016-10-03 NOTE — Telephone Encounter (Signed)
[facility] faxed a request on the following medication. Thanks CC   pravastatin (PRAVACHOL) 80 MG tablet  >Take 1 tablet at bedtime

## 2016-10-09 ENCOUNTER — Ambulatory Visit: Payer: Managed Care, Other (non HMO)

## 2016-10-16 ENCOUNTER — Ambulatory Visit (INDEPENDENT_AMBULATORY_CARE_PROVIDER_SITE_OTHER): Payer: Managed Care, Other (non HMO)

## 2016-10-16 DIAGNOSIS — Z952 Presence of prosthetic heart valve: Secondary | ICD-10-CM

## 2016-10-16 LAB — POCT INR
INR: 2.3
PT: 27.4

## 2016-11-06 ENCOUNTER — Ambulatory Visit (INDEPENDENT_AMBULATORY_CARE_PROVIDER_SITE_OTHER): Payer: Managed Care, Other (non HMO)

## 2016-11-06 DIAGNOSIS — Z952 Presence of prosthetic heart valve: Secondary | ICD-10-CM | POA: Diagnosis not present

## 2016-11-06 LAB — POCT INR
AVAILABLE: 44.3
INR: 3.7

## 2016-11-06 NOTE — Patient Instructions (Signed)
Anticoagulation Warfarin Dose Instructions as of 11/06/2016      Jason Mcdaniel Tue Wed Thu Fri Sat   New Dose 10 mg 5 mg 10 mg 5 mg 10 mg 5 mg 10 mg    Description   10mg  daily except Monday and Friday take 5mg .

## 2016-11-11 ENCOUNTER — Other Ambulatory Visit: Payer: Self-pay | Admitting: Family Medicine

## 2016-12-04 ENCOUNTER — Ambulatory Visit (INDEPENDENT_AMBULATORY_CARE_PROVIDER_SITE_OTHER): Payer: Managed Care, Other (non HMO)

## 2016-12-04 DIAGNOSIS — Z952 Presence of prosthetic heart valve: Secondary | ICD-10-CM | POA: Diagnosis not present

## 2016-12-04 LAB — POCT INR
INR: 4.2
PT: 49.9

## 2016-12-04 NOTE — Patient Instructions (Signed)
Anticoagulation Warfarin Dose Instructions as of 12/04/2016      Jason Mcdaniel Tue Wed Thu Fri Sat   New Dose 10 mg 5 mg 10 mg 5 mg 10 mg 5 mg 10 mg    Description   Hold X 2 days. Start 10mg  daily, except 5mg  on MWF.  Recheck in 3 weeks.

## 2016-12-09 ENCOUNTER — Other Ambulatory Visit: Payer: Self-pay | Admitting: Family Medicine

## 2016-12-25 ENCOUNTER — Ambulatory Visit (INDEPENDENT_AMBULATORY_CARE_PROVIDER_SITE_OTHER): Payer: Managed Care, Other (non HMO)

## 2016-12-25 DIAGNOSIS — Z952 Presence of prosthetic heart valve: Secondary | ICD-10-CM | POA: Diagnosis not present

## 2016-12-25 DIAGNOSIS — Z23 Encounter for immunization: Secondary | ICD-10-CM | POA: Diagnosis not present

## 2016-12-25 LAB — POCT INR
INR: 3.8
PT: 45

## 2016-12-25 NOTE — Patient Instructions (Signed)
Anticoagulation Warfarin Dose Instructions as of 12/25/2016      Dorene Grebe Tue Wed Thu Fri Sat   New Dose 10 mg 5 mg 10 mg 5 mg 10 mg 5 mg 10 mg    Description    Start 10mg  daily, except 5mg  on MWF.  Recheck in 4 weeks.

## 2017-01-07 ENCOUNTER — Other Ambulatory Visit: Payer: Self-pay | Admitting: Family Medicine

## 2017-01-22 ENCOUNTER — Ambulatory Visit: Payer: Managed Care, Other (non HMO)

## 2017-02-12 ENCOUNTER — Ambulatory Visit (INDEPENDENT_AMBULATORY_CARE_PROVIDER_SITE_OTHER): Payer: Managed Care, Other (non HMO)

## 2017-02-12 DIAGNOSIS — Z952 Presence of prosthetic heart valve: Secondary | ICD-10-CM | POA: Diagnosis not present

## 2017-02-12 LAB — POCT INR
INR: 5.4
PT: 64.6

## 2017-02-12 NOTE — Progress Notes (Signed)
Description   Hold for 3 days. Then resume 5 mg daily Recheck in 2 weeks.

## 2017-02-26 ENCOUNTER — Ambulatory Visit (INDEPENDENT_AMBULATORY_CARE_PROVIDER_SITE_OTHER): Payer: Managed Care, Other (non HMO)

## 2017-02-26 ENCOUNTER — Ambulatory Visit: Payer: Managed Care, Other (non HMO)

## 2017-02-26 DIAGNOSIS — Z952 Presence of prosthetic heart valve: Secondary | ICD-10-CM

## 2017-02-26 LAB — POCT INR
INR: 1.4
PT: 16.5

## 2017-02-26 NOTE — Progress Notes (Signed)
Description   5 mg daily except for 10 mg on Monday and Friday Recheck in 2 weeks.

## 2017-03-14 ENCOUNTER — Ambulatory Visit (INDEPENDENT_AMBULATORY_CARE_PROVIDER_SITE_OTHER): Payer: Managed Care, Other (non HMO)

## 2017-03-14 DIAGNOSIS — Z952 Presence of prosthetic heart valve: Secondary | ICD-10-CM | POA: Diagnosis not present

## 2017-03-14 LAB — POCT INR
INR: 2.3
PT: 27.4

## 2017-04-04 ENCOUNTER — Ambulatory Visit (INDEPENDENT_AMBULATORY_CARE_PROVIDER_SITE_OTHER): Payer: Managed Care, Other (non HMO)

## 2017-04-04 DIAGNOSIS — Z952 Presence of prosthetic heart valve: Secondary | ICD-10-CM

## 2017-04-04 LAB — POCT INR
INR: 1.7
PT: 20.2

## 2017-04-04 NOTE — Patient Instructions (Signed)
Description   5 mg daily except for 10 mg on Monday, Wednesday and Friday Recheck in 3 weeks.

## 2017-05-02 ENCOUNTER — Ambulatory Visit (INDEPENDENT_AMBULATORY_CARE_PROVIDER_SITE_OTHER): Payer: Managed Care, Other (non HMO)

## 2017-05-02 DIAGNOSIS — Z952 Presence of prosthetic heart valve: Secondary | ICD-10-CM | POA: Diagnosis not present

## 2017-05-02 LAB — POCT INR
INR: 1.7
PT: 20.1

## 2017-05-02 NOTE — Patient Instructions (Signed)
Change dose to 10mg  Daily except 5mg  M W F. Recheck in four weeks.

## 2017-05-08 ENCOUNTER — Other Ambulatory Visit: Payer: Self-pay | Admitting: Family Medicine

## 2017-05-08 NOTE — Telephone Encounter (Signed)
Pharmacy requesting refills. Thanks!  

## 2017-05-30 ENCOUNTER — Encounter: Payer: Self-pay | Admitting: Family Medicine

## 2017-05-30 ENCOUNTER — Ambulatory Visit (INDEPENDENT_AMBULATORY_CARE_PROVIDER_SITE_OTHER): Payer: Managed Care, Other (non HMO) | Admitting: Family Medicine

## 2017-05-30 ENCOUNTER — Ambulatory Visit: Payer: Managed Care, Other (non HMO)

## 2017-05-30 VITALS — BP 130/70 | HR 53 | Temp 98.5°F | Resp 16

## 2017-05-30 DIAGNOSIS — G4700 Insomnia, unspecified: Secondary | ICD-10-CM

## 2017-05-30 DIAGNOSIS — Z952 Presence of prosthetic heart valve: Secondary | ICD-10-CM

## 2017-05-30 LAB — POCT INR
INR: 2.8
PT: 33.5

## 2017-05-30 MED ORDER — TEMAZEPAM 15 MG PO CAPS: 15.0000 mg | ORAL_CAPSULE | Freq: Every evening | ORAL | 1 refills | Status: DC | PRN

## 2017-05-30 NOTE — Progress Notes (Signed)
Patient: Jason Mcdaniel Male    DOB: [birth year]   72 y.o.   MRN: [id number] Visit Date: 05/30/2017  Today's Provider: Lelon Huh, MD   Chief Complaint  Patient presents with  . Insomnia   Subjective:    Insomnia  Primary symptoms: sleep disturbance.  The current episode started more than one year. The problem has been gradually worsening since onset.  Patient comes states he has trouble falling asleep. He has been getting 6-7 hours of sleep each night, and feels well rested in the mornings. He states in the past he has taken Temazepam which worked well. Patient would like a refill on this medication. States he has trouble falling asleep sometimes due to worried going through his head, but once he falls asleep he stays asleep. He last had 30 tablets dispensed in May 2018.    He is also here for PT/INR. Doing well on current dose of warfarin with no abnormal bleeding.     Allergies  Allergen Reactions  . Penicillins Hives and Nausea Only    Has patient had a PCN reaction causing immediate rash, facial/tongue/throat swelling, SOB or lightheadedness with hypotension: Hives Has patient had a PCN reaction causing severe rash involving mucus membranes or skin necrosis: No Has patient had a PCN reaction that required hospitalization No Has patient had a PCN reaction occurring within the last 10 years: No If all of the above answers are "NO", then may proceed with Cephalosporin use.     Current Outpatient Medications:  .  amLODipine (NORVASC) 5 MG tablet, TAKE ONE TABLET TWICE DAILY, Disp: 60 tablet, Rfl: 11 .  aspirin 81 MG tablet, Take 81 mg by mouth daily., Disp: , Rfl:  .  calcium carbonate 200 MG capsule, Take 250 mg by mouth daily. , Disp: , Rfl:  .  carvedilol (COREG) 25 MG tablet, TAKE ONE TABLET BY MOUTH EVERY 12 HOURS, Disp: 60 tablet, Rfl: 12 .  Coenzyme Q10 (COQ10) 100 MG CAPS, Take 1 tablet by mouth daily., Disp: , Rfl:  .  Cyanocobalamin (RA VITAMIN B-12 TR)  1000 MCG TBCR, Take 1,000 mcg by mouth daily., Disp: , Rfl:  .  pravastatin (PRAVACHOL) 80 MG tablet, Take 1 tablet (80 mg total) by mouth at bedtime., Disp: 30 tablet, Rfl: 11 .  temazepam (RESTORIL) 15 MG capsule, Take 1 capsule (15 mg total) by mouth at bedtime as needed for sleep., Disp: 30 capsule, Rfl: 0 .  warfarin (COUMADIN) 10 MG tablet, TAKE 1 TO 2 TABLETS AS DIRECTED, Disp: 60 tablet, Rfl: 5 .  warfarin (COUMADIN) 10 MG tablet, TAKE 1 TO 2 TABLETS AS DIRECTED, Disp: 60 tablet, Rfl: 5  Review of Systems  Constitutional: Negative for appetite change, chills and fever.  Respiratory: Negative for chest tightness, shortness of breath and wheezing.   Cardiovascular: Negative for chest pain and palpitations.  Gastrointestinal: Negative for abdominal pain, nausea and vomiting.  Psychiatric/Behavioral: Positive for sleep disturbance. The patient has insomnia.     Social History   Tobacco Use  . Smoking status: Never Smoker  . Smokeless tobacco: Never Used  Substance Use Topics  . Alcohol use: Yes    Alcohol/week: 0.0 - 4.2 oz   Objective:   BP 130/70 (BP Location: Left Arm, Patient Position: Sitting, Cuff Size: Large)   Pulse (!) 53   Temp 98.5 F (36.9 C) (Oral)   Resp 16   SpO2 95% Comment: room air    Physical Exam  General  appearance: alert, well developed, well nourished, cooperative and in no distress Head: Normocephalic, without obvious abnormality, atraumatic Respiratory: Respirations even and unlabored, normal respiratory rate Extremities: No gross deformities Skin: Skin color, texture, turgor normal. No rashes seen  Psych: Appropriate mood and affect. Neurologic: Mental status: Alert, oriented to person, place, and time, thought content appropriate.  Results for orders placed or performed in visit on 05/30/17  POCT INR  Result Value Ref Range   INR 2.8    PT 33.5        Assessment & Plan:       1. Mechanical heart valve present Continue current dose  warfarin repeat INR one month.  - POCT INR  2. Insomnia, unspecified type Does well with occasional use of temazepam with no adverse effects.  - temazepam (RESTORIL) 15 MG capsule; Take 1 capsule (15 mg total) by mouth at bedtime as needed for sleep.  Dispense: 30 capsule; Refill: 1       Lelon Huh, MD  [facility]

## 2017-06-27 ENCOUNTER — Ambulatory Visit (INDEPENDENT_AMBULATORY_CARE_PROVIDER_SITE_OTHER): Payer: Managed Care, Other (non HMO)

## 2017-06-27 ENCOUNTER — Telehealth: Payer: Self-pay | Admitting: Family Medicine

## 2017-06-27 DIAGNOSIS — Z952 Presence of prosthetic heart valve: Secondary | ICD-10-CM | POA: Diagnosis not present

## 2017-06-27 LAB — POCT INR
INR: 1.8
PT: 21.3

## 2017-06-27 NOTE — Patient Instructions (Signed)
Description   Dx: Mechanical Heart Valve (Z95.2) 10 mg daily, except 5 mg M & F   '

## 2017-06-27 NOTE — Telephone Encounter (Signed)
[facility] faxed a refill request for the following medication. Thanks CC  pravastatin (PRAVACHOL) 80 MG tablet

## 2017-06-27 NOTE — Telephone Encounter (Signed)
According to our records patient should still have refills on this medication. I called pharmacy and verified that he still has 4 refills remaining.

## 2017-07-01 ENCOUNTER — Other Ambulatory Visit: Payer: Self-pay | Admitting: Family Medicine

## 2017-07-01 DIAGNOSIS — G4700 Insomnia, unspecified: Secondary | ICD-10-CM

## 2017-07-18 ENCOUNTER — Ambulatory Visit (INDEPENDENT_AMBULATORY_CARE_PROVIDER_SITE_OTHER): Payer: Managed Care, Other (non HMO) | Admitting: Emergency Medicine

## 2017-07-18 DIAGNOSIS — Z952 Presence of prosthetic heart valve: Secondary | ICD-10-CM | POA: Diagnosis not present

## 2017-07-18 LAB — POCT INR
INR: 1.9
PT: 23.1

## 2017-08-08 ENCOUNTER — Ambulatory Visit (INDEPENDENT_AMBULATORY_CARE_PROVIDER_SITE_OTHER): Payer: Managed Care, Other (non HMO)

## 2017-08-08 DIAGNOSIS — Z952 Presence of prosthetic heart valve: Secondary | ICD-10-CM | POA: Diagnosis not present

## 2017-08-08 LAB — POCT INR
INR: 3.8 — AB (ref 2.0–3.0)
PT: 45.5

## 2017-08-08 NOTE — Patient Instructions (Addendum)
Description   Dx: Mechanical Heart Valve (Z95.2) Hold for 1 day, and continue 10 mg daily, except 5 mg M

## 2017-08-14 NOTE — Progress Notes (Deleted)
Patient: Jason Mcdaniel, Male    DOB: 1946/02/29, 72 y.o.   MRN: [id number] Visit Date: 08/14/2017  Today's Provider: Lelon Huh, MD   No chief complaint on file.  Subjective:   Patient saw [name] for AWV today at 10:00 am.   Complete Physical Jason Mcdaniel is a 72 y.o. male. He feels {DESC; WELL/FAIRLY WELL/POORLY:18703}. He reports exercising ***. He reports he is sleeping {DESC; WELL/FAIRLY WELL/POORLY:18703}.  -----------------------------------------------------------   Hypertension, follow-up:  BP Readings from Last 3 Encounters:  05/30/17 130/70  08/06/16 136/60  12/20/15 124/60    He was last seen for hypertension 1 years ago.  BP at that visit was 136/60. Management since that visit includes; no changes.He reports {excellent/good/fair/poor:19665} compliance with treatment. He {ACTION; IS/IS [id number]} having side effects. *** He {is/is not:9024} exercising. He {is/is not:9024} adherent to low salt diet.   Outside blood pressures are ***. He is experiencing {Symptoms; cardiac:12860}.  Patient denies {Symptoms; cardiac:12860}.   Cardiovascular risk factors include {cv risk factors:510}.  Use of agents associated with hypertension: {bp agents assoc with hypertension:511::"none"}.   ------------------------------------------------------------------------    Lipid/Cholesterol, Follow-up:   Last seen for this 1 years ago.  Management since that visit includes; labs checked, no changes.  Last Lipid Panel:    Component Value Date/Time   CHOL 183 08/08/2016 0910   TRIG 150 (H) 08/08/2016 0910   HDL 39 (L) 08/08/2016 0910   CHOLHDL 4.7 08/08/2016 0910   LDLCALC 114 (H) 08/08/2016 0910    He reports {excellent/good/fair/poor:19665} compliance with treatment. He {ACTION; IS/IS [id number]} having side effects. ***  Wt Readings from Last 3 Encounters:  08/06/16 210 lb 3.2 oz (95.3 kg)  12/20/15 209 lb (94.8 kg)  10/10/15 205 lb (93  kg)    ------------------------------------------------------------------------  CAD in native artery From 08/06/2016-no changes. Continue to follow up with Dr. Rockey Situ.   Mechanical heart valve present From 05/30/2017-Continue current dose warfarin repeat INR in one month.   Insomnia, unspecified type From 05/30/2017-no changes. Continue prn temazepam (RESTORIL) 15 MG capsule.      Review of Systems  Social History   Socioeconomic History  . Marital status: Married    Spouse name: Not on file  . Number of children: 2  . Years of education: Not on file  . Highest education level: Not on file  Occupational History  . Occupation: Retired  Scientific laboratory technician  . Financial resource strain: Not on file  . Food insecurity:    Worry: Not on file    Inability: Not on file  . Transportation needs:    Medical: Not on file    Non-medical: Not on file  Tobacco Use  . Smoking status: Never Smoker  . Smokeless tobacco: Never Used  Substance and Sexual Activity  . Alcohol use: Yes    Alcohol/week: 0.0 - 4.2 oz  . Drug use: No  . Sexual activity: Not on file  Lifestyle  . Physical activity:    Days per week: Not on file    Minutes per session: Not on file  . Stress: Not on file  Relationships  . Social connections:    Talks on phone: Not on file    Gets together: Not on file    Attends religious service: Not on file    Active member of club or organization: Not on file    Attends meetings of clubs or organizations: Not on file    Relationship status: Not on file  .  Intimate partner violence:    Fear of current or ex partner: Not on file    Emotionally abused: Not on file    Physically abused: Not on file    Forced sexual activity: Not on file  Other Topics Concern  . Not on file  Social History Narrative  . Not on file    Past Medical History:  Diagnosis Date  . AAA (abdominal aortic aneurysm) ([place])   . 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 Chest pain, unspecified   . Coronary artery disease   . Cough    CHRONIC  . 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 Descending thoracic aortic dissection ([place]) 04/25/2006   Acute Type B aortic dissection   . Dysrhythmia   . Edema    FEET/LEGS  . Epistaxis 09/05/2011   07/2011 - Seen by ENT - resolved.  [name] History of aortic valve replacement    a.  St. Jude mechanical valve via right mini thoracotomy by Dr Gerrit Friends at [facility] ;  b. 02/2011 Echo EF 55-65%, Gr 2 DD, Triv AI, Mild MR.  [name] History of thoracic aortic aneurysm repair 02/09/2002   straight graft replacement of ascending thoracic aorta for acute type A aortic dissection (DeBakey type II) with hemiarch distal arch anastamosis and supracoronary proximal anastamosis   . HOH (hard of hearing)   . 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 Hyperlipidemia, mixed   . Hypertension    unspecified  . Myocardial infarction ([place]) 2004  . Peripheral vascular disease ([place])   . Stroke ([place]) 1999  . TIA (transient ischemic attack) 09/05/2011   Attributed to antihypertensive medications     Patient Active Problem List   Diagnosis Date Noted  . 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 Right hip pain 08/09/2015  . Seizure ([place]) 12/20/2014  . Absolute anemia 12/20/2014  . Coagulation disorder ([place]) 12/20/2014  . Dissecting aortic aneurysm, thoracic ([place]) 12/20/2014  . 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 Gout 12/20/2014  . Plantar fasciitis 12/20/2014  . Error, refractive, myopia 12/20/2014  . History of BPH 12/20/2014  . Mitral and aortic regurgitation 12/20/2014  . H/O abdominal aortic aneurysm  repair 10/05/2014  . Left flank pain 10/23/2012  . Headache(784.0) 02/03/2012  . 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 Other specified abnormal immunological findings in serum 12/09/2011  . Aortic heart valve narrowing 12/01/2011  . Cardiac murmur 12/01/2011  . Cerebrovascular accident, old 12/01/2011  . Dissection of aorta, thoracic ([place]) 09/23/2011  . Thoracic aneurysm without mention of rupture 09/23/2011  . History of thoracic aortic aneurysm repair 09/23/2011  . Epistaxis 09/05/2011  . TIA (transient ischemic attack) 09/05/2011  . Descending thoracic aortic aneurysm ([place]) 09/05/2011  . History of aortic valve replacement 03/06/2011  . Sinus bradycardia 03/06/2011  . Right bundle branch block 03/06/2011  . Hyperlipidemia 08/11/2008  . Essential hypertension 08/11/2008  . History of open heart surgery 06/20/2008  . Insomnia 03/19/2003  . Ascending aortic dissection ([place]) 02/09/2002  . CAD in native artery 03/18/1997    Past Surgical History:  Procedure Laterality Date  . ABDOMINAL AORTIC ANEURYSM REPAIR  12/02/11  [facility]  . CARDIAC CATHETERIZATION    . CARDIAC VALVE REPLACEMENT  2001   aortic valve replaced  . CATARACT EXTRACTION W/PHACO Left 09/26/2015   Procedure: CATARACT EXTRACTION PHACO AND INTRAOCULAR LENS PLACEMENT (IOC);  Surgeon: Birder Robson, MD;  Location: [facility] ORS;  Service: Ophthalmology;  Laterality: Left;  [place] 30.0 AP% 23.4 CDE 7.08 Fluid pack lot # [id number]  . CATARACT EXTRACTION W/PHACO Right 10/10/2015   Procedure: CATARACT EXTRACTION PHACO AND INTRAOCULAR LENS PLACEMENT (IOC);  Surgeon: Birder Robson, MD;  Location: [facility] ORS;  Service: Ophthalmology;  Laterality: Right;  [place] 38.4 AP% 18.7 CDE 7.18 Fluid pack lot # [id number]  . VASCULAR SURGERY    . VOCAL CORD INJECTION  2015    His family history includes Goiter in his mother; Heart attack in his brother, brother, father, and sister; Hypertension in his father and sister;  Suicidality in his son.      Current Outpatient Medications:  .  amLODipine (NORVASC) 5 MG tablet, TAKE ONE TABLET TWICE DAILY, Disp: 60 tablet, Rfl: 11 .  aspirin 81 MG tablet, Take 81 mg by mouth daily., Disp: , Rfl:  .  calcium carbonate 200 MG capsule, Take 250 mg by mouth daily. , Disp: , Rfl:  .  carvedilol (COREG) 25 MG tablet, TAKE ONE TABLET BY MOUTH EVERY 12 HOURS, Disp: 60 tablet, Rfl: 12 .  Coenzyme Q10 (COQ10) 100 MG CAPS, Take 1 tablet by mouth daily., Disp: , Rfl:  .  Cyanocobalamin (RA VITAMIN B-12 TR) 1000 MCG TBCR, Take 1,000 mcg by mouth daily., Disp: , Rfl:  .  pravastatin (PRAVACHOL) 80 MG tablet, Take 1 tablet (80 mg total) by mouth at bedtime., Disp: 30 tablet, Rfl: 11 .  temazepam (RESTORIL) 15 MG capsule, TAKE 1 CAPSULE BY MOUTH AT BEDTIME AS NEEDED FOR SLEEP, Disp: 30 capsule, Rfl: 5 .  warfarin (COUMADIN) 10 MG tablet, TAKE 1 TO 2 TABLETS AS DIRECTED, Disp: 60 tablet, Rfl: 5 .  warfarin (COUMADIN) 10 MG tablet, TAKE 1 TO 2 TABLETS AS DIRECTED, Disp: 60 tablet, Rfl: 5  Patient Care Team: Birdie Sons, MD as PCP - General (Family Medicine) Minna Merritts, MD as Consulting Physician (Cardiology) Anson Crofts, MD as Attending Physician (Cardiothoracic Surgery) Birder Robson, MD as Referring Physician (Ophthalmology)     Objective:   Vitals: There were no vitals taken for this visit.  Physical Exam  Activities of Daily Living No flowsheet data found.  Fall Risk Assessment Fall Risk  08/06/2016 08/09/2015 08/09/2015  Falls in the past year? No No No     Depression Screen PHQ 2/9 Scores 08/06/2016 08/09/2015 08/09/2015  PHQ - 2 Score 0 0 0  PHQ- 9 Score 3 - -      Assessment & Plan:    Annual Physical Reviewed patient's Family Medical History Reviewed and updated list of patient's medical providers Assessment of cognitive impairment was done Assessed patient's functional ability Established a written schedule for health screening  [place] Completed and Reviewed  Exercise Activities and Dietary recommendations Goals    None      Immunization History  Administered Date(s) Administered  . Influenza, High Dose Seasonal PF 12/20/2014, 12/20/2015, 12/25/2016  . Pneumococcal Conjugate-13 05/19/2013  . Pneumococcal Polysaccharide-23 08/25/2009, 11/01/2012  . Td 03/18/1993  . Tdap 01/02/2011  . Zoster 02/15/2010    Health Maintenance  Topic Date Due  . INFLUENZA VACCINE  10/16/2017  . Fecal DNA (Cologuard)  12/27/2018  . TETANUS/TDAP  01/01/2021  .  Hepatitis C Screening  Completed  . PNA vac Low Risk Adult  Completed     Discussed health benefits of physical activity, and encouraged him to engage in regular exercise appropriate for his age and condition.    ------------------------------------------------------------------------------------------------------------    Lelon Huh, MD  [place]

## 2017-08-15 ENCOUNTER — Encounter: Payer: Managed Care, Other (non HMO) | Admitting: Family Medicine

## 2017-08-15 ENCOUNTER — Ambulatory Visit: Payer: Managed Care, Other (non HMO)

## 2017-08-29 ENCOUNTER — Ambulatory Visit (INDEPENDENT_AMBULATORY_CARE_PROVIDER_SITE_OTHER): Payer: Managed Care, Other (non HMO)

## 2017-08-29 DIAGNOSIS — Z952 Presence of prosthetic heart valve: Secondary | ICD-10-CM | POA: Diagnosis not present

## 2017-08-29 LAB — POCT INR
INR: 2.9 (ref 2.0–3.0)
PT: 35.2

## 2017-08-29 NOTE — Patient Instructions (Signed)
Description   Dx: Mechanical Heart Valve (Z95.2) Continue 10mg  daily, except 5mg  on Monday.

## 2017-09-16 ENCOUNTER — Ambulatory Visit (INDEPENDENT_AMBULATORY_CARE_PROVIDER_SITE_OTHER): Payer: Managed Care, Other (non HMO) | Admitting: Family Medicine

## 2017-09-16 ENCOUNTER — Ambulatory Visit (INDEPENDENT_AMBULATORY_CARE_PROVIDER_SITE_OTHER): Payer: Managed Care, Other (non HMO)

## 2017-09-16 VITALS — BP 116/56

## 2017-09-16 VITALS — BP 124/50 | HR 65 | Temp 99.4°F | Ht 70.0 in | Wt 210.4 lb

## 2017-09-16 DIAGNOSIS — I10 Essential (primary) hypertension: Secondary | ICD-10-CM

## 2017-09-16 DIAGNOSIS — I726 Aneurysm of vertebral artery: Secondary | ICD-10-CM | POA: Diagnosis not present

## 2017-09-16 DIAGNOSIS — E785 Hyperlipidemia, unspecified: Secondary | ICD-10-CM | POA: Diagnosis not present

## 2017-09-16 DIAGNOSIS — Z952 Presence of prosthetic heart valve: Secondary | ICD-10-CM | POA: Diagnosis not present

## 2017-09-16 DIAGNOSIS — I712 Thoracic aortic aneurysm, without rupture: Secondary | ICD-10-CM

## 2017-09-16 DIAGNOSIS — Z0000 Encounter for general adult medical examination without abnormal findings: Secondary | ICD-10-CM

## 2017-09-16 DIAGNOSIS — Z23 Encounter for immunization: Secondary | ICD-10-CM

## 2017-09-16 DIAGNOSIS — I7123 Aneurysm of the descending thoracic aorta, without rupture: Secondary | ICD-10-CM

## 2017-09-16 NOTE — Progress Notes (Signed)
Patient: Jason Mcdaniel, Male    DOB: [birth year], 72 y.o.   MRN: [id number] Visit Date: 09/16/2017  Today's Provider: Lelon Huh, MD   Chief Complaint  Patient presents with  . Annual Exam  . Insomnia  . Hypertension  . Hyperlipidemia   Subjective:   Patient saw [name] for AWV today at 2:00 pm.   Complete Physical [name] is a 72 y.o. male. He feels fairly well. He reports exercising daily. He reports he is sleeping fairly well.  -----------------------------------------------------------   Hypertension, follow-up:  BP Readings from Last 3 Encounters:  09/16/17 (!) 116/56  09/16/17 (!) 124/50  05/30/17 130/70    He was last seen for hypertension 08/06/2016. BP at that visit was 136/60. Management since that visit includes; no changes.He reports good compliance with treatment. He is not having side effects.  He is exercising. He is not adherent to low salt diet.   Outside blood pressures are 150/70. He is experiencing none.  Patient denies chest pain, chest pressure/discomfort, claudication, dyspnea, exertional chest pressure/discomfort, fatigue, irregular heart beat, lower extremity edema, near-syncope, orthopnea, palpitations, paroxysmal nocturnal dyspnea, syncope and tachypnea.   Cardiovascular risk factors include advanced age (older than 74 for men, 92 for women), hypertension and male gender.  Use of agents associated with hypertension: NSAIDS.   ------------------------------------------------------------------------    Lipid/Cholesterol, Follow-up:   Last seen for this 08/06/2016.  Management since that visit includes; labs checked, no changes.  Last Lipid Panel:    Component Value Date/Time   CHOL 183 08/08/2016 0910   TRIG 150 (H) 08/08/2016 0910   HDL 39 (L) 08/08/2016 0910   CHOLHDL 4.7 08/08/2016 0910   LDLCALC 114 (H) 08/08/2016 0910    He reports good compliance with treatment. He is not having side effects.   Wt  Readings from Last 3 Encounters:  09/16/17 210 lb 6.4 oz (95.4 kg)  08/06/16 210 lb 3.2 oz (95.3 kg)  12/20/15 209 lb (94.8 kg)    ------------------------------------------------------------------------  CAD in native artery From 08/06/2016-no changes. Continues follow up with Dr. Rockey Situ. Patient reports good compliance with treatment. No chest pains or heart flutters. No dyspnea. Still biking and hiking.   Mechanical heart valve present From 05/30/2017-checked PT/INR. Patient reports good compliance with treatment.   Insomnia, unspecified type From 05/30/2017-no changes. Continue temazepam 15 mg capsule prn. Patient reports good compliance with treatment, good tolerance and good symptom control.    Review of Systems  Constitutional: Negative for appetite change, chills, fatigue and fever.  HENT: Positive for tinnitus. Negative for congestion, ear pain, hearing loss, nosebleeds and trouble swallowing.   Eyes: Negative for pain and visual disturbance.  Respiratory: Negative for cough, chest tightness and shortness of breath.   Cardiovascular: Negative for chest pain, palpitations and leg swelling.  Gastrointestinal: Negative for abdominal pain, blood in stool, constipation, diarrhea, nausea and vomiting.  Endocrine: Negative for polydipsia, polyphagia and polyuria.  Genitourinary: Positive for frequency. Negative for dysuria and flank pain.  Musculoskeletal: Negative for arthralgias, back pain, joint swelling, myalgias and neck stiffness.  Skin: Negative for color change, rash and wound.  Neurological: Negative for dizziness, tremors, seizures, speech difficulty, weakness, light-headedness and headaches.  Psychiatric/Behavioral: Negative for behavioral problems, confusion, decreased concentration, dysphoric mood and sleep disturbance. The patient is not nervous/anxious.   All other systems reviewed and are negative.   Social History   Socioeconomic History  . Marital status:  Married    Spouse name: Not on  file  . Number of children: 2  . Years of education: Not on file  . Highest education level: Some college, no degree  Occupational History  . Occupation: Retired  Scientific laboratory technician  . Financial resource strain: Not hard at all  . Food insecurity:    Worry: Never true    Inability: Never true  . Transportation needs:    Medical: No    Non-medical: No  Tobacco Use  . Smoking status: Never Smoker  . Smokeless tobacco: Never Used  Substance and Sexual Activity  . Alcohol use: Yes    Alcohol/week: 0.0 - 2.4 oz  . Drug use: No  . Sexual activity: Not on file  Lifestyle  . Physical activity:    Days per week: Not on file    Minutes per session: Not on file  . Stress: Not at all  Relationships  . Social connections:    Talks on phone: Not on file    Gets together: Not on file    Attends religious service: Not on file    Active member of club or organization: Not on file    Attends meetings of clubs or organizations: Not on file    Relationship status: Not on file  . Intimate partner violence:    Fear of current or ex partner: Not on file    Emotionally abused: Not on file    Physically abused: Not on file    Forced sexual activity: Not on file  Other Topics Concern  . Not on file  Social History Narrative  . Not on file    Past Medical History:  Diagnosis Date  . AAA (abdominal aortic aneurysm) ([place])   . 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 Chest pain, unspecified   . Coronary artery disease   . Cough    CHRONIC  . 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 Descending thoracic aortic dissection  ([place]) 04/25/2006   Acute Type B aortic dissection   . Dysrhythmia   . Edema    FEET/LEGS  . Epistaxis 09/05/2011   07/2011 - Seen by ENT - resolved.  [name] History of aortic valve replacement    a.  St. Jude mechanical valve via right mini thoracotomy by Dr Gerrit Friends at [facility] ;  b. 02/2011 Echo EF 55-65%, Gr 2 DD, Triv AI, Mild MR.  [name] History of thoracic aortic aneurysm repair 02/09/2002   straight graft replacement of ascending thoracic aorta for acute type A aortic dissection (DeBakey type II) with hemiarch distal arch anastamosis and supracoronary proximal anastamosis   . HOH (hard of hearing)   . 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 Hyperlipidemia, mixed   . Hypertension    unspecified  . Myocardial infarction ([place]) 2004  . Peripheral vascular disease ([place])   . Stroke ([place]) 1999  . TIA (transient ischemic attack) 09/05/2011   Attributed to antihypertensive medications     Patient Active Problem List   Diagnosis Date Noted  . 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 Right hip pain 08/09/2015  . Seizure ([place]) 12/20/2014  . Absolute anemia 12/20/2014  . Coagulation disorder ([place]) 12/20/2014  . Dissecting aortic aneurysm, thoracic (  [place]) 12/20/2014  . 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 Gout 12/20/2014  . Plantar fasciitis 12/20/2014  . Error, refractive, myopia 12/20/2014  . History of BPH 12/20/2014  . Mitral and aortic regurgitation 12/20/2014  . H/O abdominal aortic aneurysm repair 10/05/2014  . Left flank pain 10/23/2012  . Headache(784.0) 02/03/2012  . 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 Other specified abnormal immunological findings in serum 12/09/2011  . Aortic heart valve narrowing 12/01/2011  . Cardiac murmur 12/01/2011  . Cerebrovascular accident, old 12/01/2011  . Dissection of aorta, thoracic ([place]) 09/23/2011  . Thoracic aneurysm without mention of rupture 09/23/2011  . History of thoracic aortic aneurysm repair  09/23/2011  . Epistaxis 09/05/2011  . TIA (transient ischemic attack) 09/05/2011  . Descending thoracic aortic aneurysm ([place]) 09/05/2011  . History of aortic valve replacement 03/06/2011  . Sinus bradycardia 03/06/2011  . Right bundle branch block 03/06/2011  . Hyperlipidemia 08/11/2008  . Essential hypertension 08/11/2008  . History of open heart surgery 06/20/2008  . Insomnia 03/19/2003  . Ascending aortic dissection ([place]) 02/09/2002  . CAD in native artery 03/18/1997    Past Surgical History:  Procedure Laterality Date  . ABDOMINAL AORTIC ANEURYSM REPAIR  12/02/11   [facility]  . CARDIAC CATHETERIZATION    . CARDIAC VALVE REPLACEMENT  2001   aortic valve replaced  . CATARACT EXTRACTION W/PHACO Left 09/26/2015   Procedure: CATARACT EXTRACTION PHACO AND INTRAOCULAR LENS PLACEMENT (IOC);  Surgeon: Birder Robson, MD;  Location: [facility] ORS;  Service: Ophthalmology;  Laterality: Left;  [place] 30.0 AP% 23.4 CDE 7.08 Fluid pack lot # [id number]  . CATARACT EXTRACTION W/PHACO Right 10/10/2015   Procedure: CATARACT EXTRACTION PHACO AND INTRAOCULAR LENS PLACEMENT (IOC);  Surgeon: Birder Robson, MD;  Location: [facility] ORS;  Service: Ophthalmology;  Laterality: Right;  [place] 38.4 AP% 18.7 CDE 7.18 Fluid pack lot # [id number]  . VASCULAR SURGERY    . VOCAL CORD INJECTION  2015    His family history includes Goiter in his mother; Heart attack in his brother, brother, father, and sister; Hypertension in his father and sister; Suicidality in his son.      Current Outpatient Medications:  .  amLODipine (NORVASC) 5 MG tablet, TAKE ONE TABLET TWICE DAILY, Disp: 60 tablet, Rfl: 11 .  aspirin 81 MG tablet, Take 81 mg by mouth daily., Disp: , Rfl:  .  calcium carbonate 200 MG capsule, Take 250 mg by mouth daily. , Disp: , Rfl:  .  carvedilol (COREG) 25 MG tablet, TAKE ONE TABLET BY MOUTH EVERY 12 HOURS, Disp: 60 tablet, Rfl: 12 .  Coenzyme Q10 (COQ10) 100 MG CAPS, Take 1 tablet by mouth daily., Disp: , Rfl:  .   Cyanocobalamin (RA VITAMIN B-12 TR) 1000 MCG TBCR, Take 1,000 mcg by mouth daily., Disp: , Rfl:  .  pravastatin (PRAVACHOL) 80 MG tablet, Take 1 tablet (80 mg total) by mouth at bedtime., Disp: 30 tablet, Rfl: 11 .  temazepam (RESTORIL) 15 MG capsule, TAKE 1 CAPSULE BY MOUTH AT BEDTIME AS NEEDED FOR SLEEP, Disp: 30 capsule, Rfl: 5 .  warfarin (COUMADIN) 10 MG tablet, TAKE 1 TO 2 TABLETS AS DIRECTED, Disp: 60 tablet, Rfl: 5 .  warfarin (COUMADIN) 10 MG tablet, TAKE 1 TO 2 TABLETS AS DIRECTED (Patient not taking: Reported on 09/16/2017), Disp: 60 tablet, Rfl: 5  Patient Care Team: Birdie Sons, MD as PCP - General (Family Medicine) Minna Merritts, MD as Consulting Physician (Cardiology) Gershon Crane  IV, MD as Attending Physician (Cardiothoracic Surgery) Birder Robson, MD as Referring Physician (Ophthalmology)     Objective:   Vitals: BP (!) 116/56 (BP Location: Left Arm, Patient Position: Sitting, Cuff Size: Large)  Vitals:   BP 124/50 (BP Location: Right Arm)   Pulse 65   Temp 99.4 F (37.4 C) (Oral)   Ht 5\' 10"  (1.778 m)   Wt 210 lb 6.4 oz (95.4 kg)   BMI 30.19 kg/m   BSA 2.17 m      Physical Exam   General Appearance:    Alert, cooperative, no distress, appears stated age  Head:    Normocephalic, without obvious abnormality, atraumatic  Eyes:    PERRL, conjunctiva/corneas clear, EOM's intact, fundi    benign, both eyes       Ears:    Normal TM's and external ear canals, both ears  Nose:   Nares normal, septum midline, mucosa normal, no drainage   or sinus tenderness  Throat:   Lips, mucosa, and tongue normal; teeth and gums normal  Neck:   Supple, symmetrical, trachea midline, no adenopathy;       thyroid:  No enlargement/tenderness/nodules; no carotid   bruit or JVD  Back:     Symmetric, no curvature, ROM normal, no CVA tenderness  Lungs:     Clear to auscultation bilaterally, respirations unlabored  Chest wall:    No tenderness or deformity  Heart:     Regular rate and rhythm, S1 and S2 normal with mechanical systolic click.   Abdomen:     Soft, non-tender, bowel sounds active all four quadrants,    no masses, no organomegaly  Genitalia:    deferred  Rectal:    deferred  Extremities:   Extremities normal, atraumatic, no cyanosis or edema  Pulses:   2+ and symmetric all extremities  Skin:   Skin color, texture, turgor normal, no rashes or lesions  Lymph nodes:   Cervical, supraclavicular, and axillary nodes normal  Neurologic:   CNII-XII intact. Normal strength, sensation and reflexes      throughout    Activities of Daily Living In your present state of health, do you have any difficulty performing the following activities: 09/16/2017  Hearing? Y  Comment Does not wear hearing aids. Has trouble with background noise.   Vision? N  Difficulty concentrating or making decisions? N  Walking or climbing stairs? N  Dressing or bathing? N  Doing errands, shopping? N  Preparing Food and eating ? N  Using the Toilet? N  In the past six months, have you accidently leaked urine? N  Do you have problems with loss of bowel control? N  Managing your Medications? N  Managing your Finances? N  Housekeeping or managing your Housekeeping? N  Some recent data might be hidden    Fall Risk Assessment Fall Risk  09/16/2017 08/06/2016 08/09/2015 08/09/2015  Falls in the past year? No No No No     Depression Screen PHQ 2/9 Scores 09/16/2017 09/16/2017 08/06/2016 08/09/2015  PHQ - 2 Score 0 0 0 0  PHQ- 9 Score 2 - 3 -      Assessment & Plan:    Annual Physical Reviewed patient's Family Medical History Reviewed and updated list of patient's medical providers Assessment of cognitive impairment was done Assessed patient's functional ability Established a written schedule for health screening [place] Completed and Reviewed  Exercise Activities and Dietary recommendations Goals    . DIET - INCREASE WATER INTAKE  Recommend  increasing water intake to 6 glasses a day.        Immunization History  Administered Date(s) Administered  . Influenza, High Dose Seasonal PF 12/20/2014, 12/20/2015, 12/25/2016  . Pneumococcal Conjugate-13 05/19/2013  . Pneumococcal Polysaccharide-23 08/25/2009, 11/01/2012  . Td 03/18/1993  . Tdap 01/02/2011  . Zoster 02/15/2010    Health Maintenance  Topic Date Due  . INFLUENZA VACCINE  10/16/2017  . Fecal DNA (Cologuard)  12/27/2018  . TETANUS/TDAP  01/01/2021  . Hepatitis C Screening  Completed  . PNA vac Low Risk Adult  Completed     Discussed health benefits of physical activity, and encouraged him to engage in regular exercise appropriate for his age and condition.    ------------------------------------------------------------------------------------------------------------   1. Annual physical exam  - Comprehensive metabolic panel - Lipid panel - PSA  2. Descending thoracic aortic aneurysm ([place]) Due for CTA in 08/2018  3. Essential hypertension Well controlled.  Continue current medications.    4. Vertebral artery aneurysm [facility]) Asymptomatic Followed [facility] vascular and neurosurgery.   5. Hyperlipidemia, unspecified hyperlipidemia type He is tolerating pravastatin well with no adverse effects.    6. History of aortic valve replacement Continue anticoagulation with INR goal 2.5 to 3.5  7. Need for shingles vaccine  - Varicella-zoster vaccine IM Repeat in 2 months.    Lelon Huh, MD  [facility]

## 2017-09-16 NOTE — Patient Instructions (Addendum)
Jason Mcdaniel , Thank you for taking time to come for your Medicare Wellness Visit. I appreciate your ongoing commitment to your health goals. Please review the following plan we discussed and let me know if I can assist you in the future.   Screening recommendations/referrals: Colonoscopy: Up to date Recommended yearly ophthalmology/optometry visit for glaucoma screening and checkup Recommended yearly dental visit for hygiene and checkup  Vaccinations: Influenza vaccine: Up to date Pneumococcal vaccine: Up to date Tdap vaccine: Up to date Shingles vaccine: Pt declines today.     Advanced directives: Advance directive discussed with you today. I have provided a copy for you to complete at home and have notarized. Once this is complete please bring a copy in to our office so we can scan it into your chart.  Conditions/risks identified: Recommend increasing water intake to 6 glasses a day.   Next appointment: 3:00 PM today with Dr Caryn Section.   Preventive Care 74 Years and Older, Male Preventive care refers to lifestyle choices and visits with your health care provider that can promote health and wellness. What does preventive care include?  A yearly physical exam. This is also called an annual well check.  Dental exams once or twice a year.  Routine eye exams. Ask your health care provider how often you should have your eyes checked.  Personal lifestyle choices, including:  Daily care of your teeth and gums.  Regular physical activity.  Eating a healthy diet.  Avoiding tobacco and drug use.  Limiting alcohol use.  Practicing safe sex.  Taking low doses of aspirin every day.  Taking vitamin and mineral supplements as recommended by your health care provider. What happens during an annual well check? The services and screenings done by your health care provider during your annual well check will depend on your age, overall health, lifestyle risk factors, and family history of  disease. Counseling  Your health care provider may ask you questions about your:  Alcohol use.  Tobacco use.  Drug use.  Emotional well-being.  Home and relationship well-being.  Sexual activity.  Eating habits.  History of falls.  Memory and ability to understand (cognition).  Work and work Statistician. Screening  You may have the following tests or measurements:  Height, weight, and BMI.  Blood pressure.  Lipid and cholesterol levels. These may be checked every 5 years, or more frequently if you are over 40 years old.  Skin check.  Lung cancer screening. You may have this screening every year starting at age 10 if you have a 30-pack-year history of smoking and currently smoke or have quit within the past 15 years.  Fecal occult blood test (FOBT) of the stool. You may have this test every year starting at age 6.  Flexible sigmoidoscopy or colonoscopy. You may have a sigmoidoscopy every 5 years or a colonoscopy every 10 years starting at age 2.  Prostate cancer screening. Recommendations will vary depending on your family history and other risks.  Hepatitis C blood test.  Hepatitis B blood test.  Sexually transmitted disease (STD) testing.  Diabetes screening. This is done by checking your blood sugar (glucose) after you have not eaten for a while (fasting). You may have this done every 1-3 years.  Abdominal aortic aneurysm (AAA) screening. You may need this if you are a current or former smoker.  Osteoporosis. You may be screened starting at age 74 if you are at high risk. Talk with your health care provider about your test results, treatment  options, and if necessary, the need for more tests. Vaccines  Your health care provider may recommend certain vaccines, such as:  Influenza vaccine. This is recommended every year.  Tetanus, diphtheria, and acellular pertussis (Tdap, Td) vaccine. You may need a Td booster every 10 years.  Zoster vaccine. You may  need this after age 74.  Pneumococcal 13-valent conjugate (PCV13) vaccine. One dose is recommended after age 54.  Pneumococcal polysaccharide (PPSV23) vaccine. One dose is recommended after age 24. Talk to your health care provider about which screenings and vaccines you need and how often you need them. This information is not intended to replace advice given to you by your health care provider. Make sure you discuss any questions you have with your health care provider. Document Released: 03/31/2015 Document Revised: 11/22/2015 Document Reviewed: 01/03/2015 Elsevier Interactive Patient Education  2017 [place] Prevention in the Home Falls can cause injuries. They can happen to people of all ages. There are many things you can do to make your home safe and to help prevent falls. What can I do on the outside of my home?  Regularly fix the edges of walkways and driveways and fix any cracks.  Remove anything that might make you trip as you walk through a door, such as a raised step or threshold.  Trim any bushes or trees on the path to your home.  Use bright outdoor lighting.  Clear any walking paths of anything that might make someone trip, such as rocks or tools.  Regularly check to see if handrails are loose or broken. Make sure that both sides of any steps have handrails.  Any raised decks and porches should have guardrails on the edges.  Have any leaves, snow, or ice cleared regularly.  Use sand or salt on walking paths during winter.  Clean up any spills in your garage right away. This includes oil or grease spills. What can I do in the bathroom?  Use night lights.  Install grab bars by the toilet and in the tub and shower. Do not use towel bars as grab bars.  Use non-skid mats or decals in the tub or shower.  If you need to sit down in the shower, use a plastic, non-slip stool.  Keep the floor dry. Clean up any water that spills on the floor as soon as it  happens.  Remove soap buildup in the tub or shower regularly.  Attach bath mats securely with double-sided non-slip rug tape.  Do not have throw rugs and other things on the floor that can make you trip. What can I do in the bedroom?  Use night lights.  Make sure that you have a light by your bed that is easy to reach.  Do not use any sheets or blankets that are too big for your bed. They should not hang down onto the floor.  Have a firm chair that has side arms. You can use this for support while you get dressed.  Do not have throw rugs and other things on the floor that can make you trip. What can I do in the kitchen?  Clean up any spills right away.  Avoid walking on wet floors.  Keep items that you use a lot in easy-to-reach places.  If you need to reach something above you, use a strong step stool that has a grab bar.  Keep electrical cords out of the way.  Do not use floor polish or wax that makes floors slippery.  If you must use wax, use non-skid floor wax.  Do not have throw rugs and other things on the floor that can make you trip. What can I do with my stairs?  Do not leave any items on the stairs.  Make sure that there are handrails on both sides of the stairs and use them. Fix handrails that are broken or loose. Make sure that handrails are as long as the stairways.  Check any carpeting to make sure that it is firmly attached to the stairs. Fix any carpet that is loose or worn.  Avoid having throw rugs at the top or bottom of the stairs. If you do have throw rugs, attach them to the floor with carpet tape.  Make sure that you have a light switch at the top of the stairs and the bottom of the stairs. If you do not have them, ask someone to add them for you. What else can I do to help prevent falls?  Wear shoes that:  Do not have high heels.  Have rubber bottoms.  Are comfortable and fit you well.  Are closed at the toe. Do not wear sandals.  If you  use a stepladder:  Make sure that it is fully opened. Do not climb a closed stepladder.  Make sure that both sides of the stepladder are locked into place.  Ask someone to hold it for you, if possible.  Clearly mark and make sure that you can see:  Any grab bars or handrails.  First and last steps.  Where the edge of each step is.  Use tools that help you move around (mobility aids) if they are needed. These include:  Canes.  Walkers.  Scooters.  Crutches.  Turn on the lights when you go into a dark area. Replace any light bulbs as soon as they burn out.  Set up your furniture so you have a clear path. Avoid moving your furniture around.  If any of your floors are uneven, fix them.  If there are any pets around you, be aware of where they are.  Review your medicines with your doctor. Some medicines can make you feel dizzy. This can increase your chance of falling. Ask your doctor what other things that you can do to help prevent falls. This information is not intended to replace advice given to you by your health care provider. Make sure you discuss any questions you have with your health care provider. Document Released: 12/29/2008 Document Revised: 08/10/2015 Document Reviewed: 04/08/2014 Elsevier Interactive Patient Education  2017 [employer].

## 2017-09-16 NOTE — Progress Notes (Signed)
Subjective:   TEVION LAFORGE is a 72 y.o. male who presents for Medicare Annual/Subsequent preventive examination.  Review of Systems:  N/A  Cardiac Risk Factors include: advanced age (>28men, >58 women);male gender;dyslipidemia;obesity (BMI >30kg/m2)     Objective:    Vitals: BP (!) 124/50 (BP Location: Right Arm)   Pulse 65   Temp 99.4 F (37.4 C) (Oral)   Ht 5\' 10"  (1.778 m)   Wt 210 lb 6.4 oz (95.4 kg)   BMI 30.19 kg/m   Body mass index is 30.19 kg/m.  Advanced Directives 09/16/2017 09/16/2017 08/06/2016 10/10/2015  Does Patient Have a Medical Advance Directive? Yes Yes Yes Yes  Type of Advance Directive - [place];Living will Living will;Healthcare Power of Attorney Living will  Does patient want to make changes to medical advance directive? Yes (MAU/Ambulatory/Procedural Areas - Information given) - - -  Copy of Healthcare Power of Attorney in Chart? - No - copy requested Yes Yes    Tobacco Social History   Tobacco Use  Smoking Status Never Smoker  Smokeless Tobacco Never Used     Counseling given: Not Answered   Clinical Intake:  Pre-visit preparation completed: Yes  Pain : No/denies pain Pain Score: 0-No pain     Nutritional Status: BMI > 30  Obese Nutritional Risks: None Diabetes: No  How often do you need to have someone help you when you read instructions, pamphlets, or other written materials from your doctor or pharmacy?: 1 - Never  Interpreter Needed?: No  Information entered by :: [facility], LPN  Past Medical History:  Diagnosis Date  . AAA (abdominal aortic aneurysm) ([place])   . 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 Chest pain, unspecified   . Coronary artery  disease   . Cough    CHRONIC  . 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 Descending thoracic aortic dissection ([place]) 04/25/2006   Acute Type B aortic dissection   . Dysrhythmia   . Edema    FEET/LEGS  . Epistaxis 09/05/2011   07/2011 - Seen by ENT - resolved.  [name] History of aortic valve replacement    a.  St. Jude mechanical valve via right mini thoracotomy by Dr Gerrit Friends at [facility] ;  b. 02/2011 Echo EF 55-65%, Gr 2 DD, Triv AI, Mild MR.  [name] History of thoracic aortic aneurysm repair 02/09/2002   straight graft replacement of ascending thoracic aorta for acute type A aortic dissection (DeBakey type II) with hemiarch distal arch anastamosis and supracoronary proximal anastamosis   . HOH (hard of hearing)   . 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 Hyperlipidemia, mixed   . Hypertension    unspecified  . Myocardial infarction ([place]) 2004  . Peripheral vascular disease ([place])   . Stroke ([place]) 1999  . TIA (transient ischemic attack) 09/05/2011   Attributed to antihypertensive medications   Past Surgical History:  Procedure Laterality Date  . ABDOMINAL AORTIC ANEURYSM REPAIR  12/02/11   [facility]  . CARDIAC CATHETERIZATION    . CARDIAC VALVE REPLACEMENT  2001   aortic valve replaced  . CATARACT EXTRACTION W/PHACO Left 09/26/2015   Procedure: CATARACT EXTRACTION PHACO AND INTRAOCULAR  LENS PLACEMENT (IOC);  Surgeon: Birder Robson, MD;  Location: [facility] ORS;  Service: Ophthalmology;  Laterality: Left;  [place] 30.0 AP% 23.4 CDE 7.08 Fluid pack lot # [id number]  . CATARACT EXTRACTION W/PHACO Right 10/10/2015   Procedure: CATARACT EXTRACTION PHACO AND INTRAOCULAR LENS PLACEMENT (IOC);  Surgeon: Birder Robson, MD;  Location: [facility] ORS;  Service: Ophthalmology;  Laterality: Right;  [place] 38.4 AP% 18.7 CDE 7.18 Fluid pack lot # [id number]  . VASCULAR SURGERY    . VOCAL CORD INJECTION  2015   Family History  Problem  Relation Age of Onset  . Goiter Mother   . Heart attack Father   . Hypertension Father   . Hypertension Sister   . Heart attack Sister   . Heart attack Brother   . Heart attack Brother   . Suicidality Son    Social History   Socioeconomic History  . Marital status: Married    Spouse name: Not on file  . Number of children: 2  . Years of education: Not on file  . Highest education level: Some college, no degree  Occupational History  . Occupation: Retired  Scientific laboratory technician  . Financial resource strain: Not hard at all  . Food insecurity:    Worry: Never true    Inability: Never true  . Transportation needs:    Medical: No    Non-medical: No  Tobacco Use  . Smoking status: Never Smoker  . Smokeless tobacco: Never Used  Substance and Sexual Activity  . Alcohol use: Yes    Alcohol/week: 0.0 - 2.4 oz  . Drug use: No  . Sexual activity: Not on file  Lifestyle  . Physical activity:    Days per week: Not on file    Minutes per session: Not on file  . Stress: Not at all  Relationships  . Social connections:    Talks on phone: Not on file    Gets together: Not on file    Attends religious service: Not on file    Active member of club or organization: Not on file    Attends meetings of clubs or organizations: Not on file    Relationship status: Not on file  Other Topics Concern  . Not on file  Social History Narrative  . Not on file    Outpatient Encounter Medications as of 09/16/2017  Medication Sig  . amLODipine (NORVASC) 5 MG tablet TAKE ONE TABLET TWICE DAILY  . aspirin 81 MG tablet Take 81 mg by mouth daily.  . calcium carbonate 200 MG capsule Take 250 mg by mouth daily.   . carvedilol (COREG) 25 MG tablet TAKE ONE TABLET BY MOUTH EVERY 12 HOURS  . Coenzyme Q10 (COQ10) 100 MG CAPS Take 1 tablet by mouth daily.  . Cyanocobalamin (RA VITAMIN B-12 TR) 1000 MCG TBCR Take 1,000 mcg by mouth daily.  . pravastatin (PRAVACHOL) 80 MG tablet Take 1 tablet (80 mg total) by  mouth at bedtime.  . temazepam (RESTORIL) 15 MG capsule TAKE 1 CAPSULE BY MOUTH AT BEDTIME AS NEEDED FOR SLEEP  . warfarin (COUMADIN) 10 MG tablet TAKE 1 TO 2 TABLETS AS DIRECTED  . warfarin (COUMADIN) 10 MG tablet TAKE 1 TO 2 TABLETS AS DIRECTED (Patient not taking: Reported on 09/16/2017)   No facility-administered encounter medications on file as of 09/16/2017.     Activities of Daily Living In your present state of health, do you have any difficulty performing the following activities: 09/16/2017  Hearing? Y  Comment Does not  wear hearing aids. Has trouble with background noise.   Vision? N  Difficulty concentrating or making decisions? N  Walking or climbing stairs? N  Dressing or bathing? N  Doing errands, shopping? N  Preparing Food and eating ? N  Using the Toilet? N  In the past six months, have you accidently leaked urine? N  Do you have problems with loss of bowel control? N  Managing your Medications? N  Managing your Finances? N  Housekeeping or managing your Housekeeping? N  Some recent data might be hidden    Patient Care Team: Birdie Sons, MD as PCP - General (Family Medicine) Minna Merritts, MD as Consulting Physician (Cardiology) Anson Crofts, MD as Attending Physician (Cardiothoracic Surgery) Birder Robson, MD as Referring Physician (Ophthalmology)   Assessment:   This is a routine wellness examination for Tashaun.  Exercise Activities and Dietary recommendations Current Exercise Habits: Home exercise routine, Type of exercise: walking;strength training/weights;Other - see comments(swim), Time (Minutes): 45, Frequency (Times/Week): 7, Weekly Exercise (Minutes/Week): 315, Intensity: Mild, Exercise limited by: None identified  Goals    . DIET - INCREASE WATER INTAKE     Recommend increasing water intake to 6 glasses a day.        Fall Risk Fall Risk  09/16/2017 08/06/2016 08/09/2015 08/09/2015  Falls in the past year? No No No No   Is the  patient's home free of loose throw rugs in walkways, pet beds, electrical cords, etc?   yes      Grab bars in the bathroom? no      Handrails on the stairs?   yes      Adequate lighting?   yes  Timed Get Up and Go Performed: N/A  Depression Screen PHQ 2/9 Scores 09/16/2017 09/16/2017 08/06/2016 08/09/2015  PHQ - 2 Score 0 0 0 0  PHQ- 9 Score 2 - 3 -    Cognitive Function: Pt declined screening today.      6CIT Screen 08/06/2016  What Year? 0 points  What month? 0 points  What time? 0 points  Count back from 20 0 points  Months in reverse 2 points  Repeat phrase 8 points  Total Score 10    Immunization History  Administered Date(s) Administered  . Influenza, High Dose Seasonal PF 12/20/2014, 12/20/2015, 12/25/2016  . Pneumococcal Conjugate-13 05/19/2013  . Pneumococcal Polysaccharide-23 08/25/2009, 11/01/2012  . Td 03/18/1993  . Tdap 01/02/2011  . Zoster 02/15/2010    Qualifies for Shingles Vaccine? Due for Shingles vaccine. Declined my offer to administer today. Education has been provided regarding the importance of this vaccine. Pt has been advised to call her insurance company to determine her out of pocket expense. Advised she may also receive this vaccine at her local pharmacy or Health Dept. Verbalized acceptance and understanding.  Screening Tests Health Maintenance  Topic Date Due  . INFLUENZA VACCINE  10/16/2017  . Fecal DNA (Cologuard)  12/27/2018  . TETANUS/TDAP  01/01/2021  . Hepatitis C Screening  Completed  . PNA vac Low Risk Adult  Completed   Cancer Screenings: Lung: Low Dose CT Chest recommended if Age 43-80 years, 30 pack-year currently smoking OR have quit w/in 15years. Patient does not qualify. Colorectal: Up to date  Additional Screenings:  Hepatitis C Screening: Up to date      Plan:  I have personally reviewed and addressed the Medicare Annual Wellness questionnaire and have noted the following in the patient's chart:  A. Medical and social  history  B. Use of alcohol, tobacco or illicit drugs  C. Current medications and supplements D. Functional ability and status E.  Nutritional status F.  Physical activity G. Advance directives H. List of other physicians I.  Hospitalizations, surgeries, and ER visits in previous 12 months J.  [place] such as hearing and vision if needed, cognitive and depression L. Referrals and appointments - none  In addition, I have reviewed and discussed with patient certain preventive protocols, quality metrics, and best practice recommendations. A written personalized care plan for preventive services as well as general preventive health recommendations were provided to patient.  See attached scanned questionnaire for additional information.   Signed,  Fabio Neighbors, LPN Nurse Health Advisor   Nurse Recommendations: None.

## 2017-09-26 ENCOUNTER — Ambulatory Visit (INDEPENDENT_AMBULATORY_CARE_PROVIDER_SITE_OTHER): Payer: Managed Care, Other (non HMO)

## 2017-09-26 ENCOUNTER — Telehealth: Payer: Self-pay

## 2017-09-26 DIAGNOSIS — Z952 Presence of prosthetic heart valve: Secondary | ICD-10-CM

## 2017-09-26 LAB — COMPREHENSIVE METABOLIC PANEL WITH GFR
ALT: 10 [IU]/L (ref 0–44)
AST: 17 [IU]/L (ref 0–40)
Albumin/Globulin Ratio: 2 (ref 1.2–2.2)
Albumin: 4.1 g/dL (ref 3.5–4.8)
Alkaline Phosphatase: 85 [IU]/L (ref 39–117)
BUN/Creatinine Ratio: 18 (ref 10–24)
BUN: 19 mg/dL (ref 8–27)
Bilirubin Total: 0.3 mg/dL (ref 0.0–1.2)
CO2: 25 mmol/L (ref 20–29)
Calcium: 9.2 mg/dL (ref 8.6–10.2)
Chloride: 104 mmol/L (ref 96–106)
Creatinine, Ser: 1.04 mg/dL (ref 0.76–1.27)
GFR calc Af Amer: 82 mL/min/{1.73_m2}
GFR calc non Af Amer: 71 mL/min/{1.73_m2}
Globulin, Total: 2.1 g/dL (ref 1.5–4.5)
Glucose: 88 mg/dL (ref 65–99)
Potassium: 4.2 mmol/L (ref 3.5–5.2)
Sodium: 141 mmol/L (ref 134–144)
Total Protein: 6.2 g/dL (ref 6.0–8.5)

## 2017-09-26 LAB — LIPID PANEL
CHOL/HDL RATIO: 4.6 ratio (ref 0.0–5.0)
CHOLESTEROL TOTAL: 164 mg/dL (ref 100–199)
HDL: 36 mg/dL — AB (ref >39)
LDL CALC: 106 mg/dL — AB (ref 0–99)
Triglycerides: 108 mg/dL (ref 0–149)
VLDL Cholesterol Cal: 22 mg/dL (ref 5–40)

## 2017-09-26 LAB — POCT INR
INR: 3.3 — AB (ref 2.0–3.0)
PT: 39.9

## 2017-09-26 LAB — PSA: Prostate Specific Ag, Serum: 1.8 ng/mL (ref 0.0–4.0)

## 2017-09-26 NOTE — Telephone Encounter (Signed)
Pt advised.   Thanks,   -Laura  

## 2017-09-26 NOTE — Telephone Encounter (Signed)
-----   Message from Birdie Sons, MD sent at 09/26/2017  7:47 AM EDT ----- Labs good. psa normal. Cholesterol 164. Continue current medications.  Check yearly

## 2017-09-26 NOTE — Patient Instructions (Signed)
Description   Dx: Mechanical Heart Valve (Z95.2) Continue 10mg  daily, except 5mg  on Monday and Friday

## 2017-10-01 ENCOUNTER — Other Ambulatory Visit: Payer: Self-pay | Admitting: Family Medicine

## 2017-10-24 ENCOUNTER — Ambulatory Visit (INDEPENDENT_AMBULATORY_CARE_PROVIDER_SITE_OTHER): Payer: Managed Care, Other (non HMO)

## 2017-10-24 DIAGNOSIS — Z952 Presence of prosthetic heart valve: Secondary | ICD-10-CM | POA: Diagnosis not present

## 2017-10-24 LAB — POCT INR
INR: 3.5 — AB (ref 2.0–3.0)
PT: 42.3

## 2017-11-11 ENCOUNTER — Other Ambulatory Visit: Payer: Self-pay | Admitting: Family Medicine

## 2017-11-19 ENCOUNTER — Ambulatory Visit: Payer: Managed Care, Other (non HMO) | Admitting: Family Medicine

## 2017-11-21 ENCOUNTER — Ambulatory Visit: Payer: Managed Care, Other (non HMO)

## 2017-12-03 ENCOUNTER — Ambulatory Visit (INDEPENDENT_AMBULATORY_CARE_PROVIDER_SITE_OTHER): Payer: Managed Care, Other (non HMO)

## 2017-12-03 DIAGNOSIS — Z952 Presence of prosthetic heart valve: Secondary | ICD-10-CM | POA: Diagnosis not present

## 2017-12-03 LAB — POCT INR
INR: 4.4 — AB (ref 2.0–3.0)
PT: 52.4

## 2017-12-03 NOTE — Patient Instructions (Signed)
Description   Dx: Mechanical Heart Valve (Z95.2) HOLD FOR TWO DAYS then Continue 10mg  daily, except 5 mg on Monday, Wednesday and  Friday

## 2017-12-17 ENCOUNTER — Ambulatory Visit (INDEPENDENT_AMBULATORY_CARE_PROVIDER_SITE_OTHER): Payer: Managed Care, Other (non HMO)

## 2017-12-17 DIAGNOSIS — Z952 Presence of prosthetic heart valve: Secondary | ICD-10-CM

## 2017-12-17 LAB — POCT INR
INR: 2.1 (ref 2.0–3.0)
PT: 25.1

## 2017-12-17 NOTE — Patient Instructions (Signed)
Description   Dx: Mechanical Heart Valve (Z95.2)  Continue 10mg  daily, except 5 mg on Monday, Wednesday and  Friday

## 2017-12-31 ENCOUNTER — Ambulatory Visit (INDEPENDENT_AMBULATORY_CARE_PROVIDER_SITE_OTHER): Payer: Managed Care, Other (non HMO)

## 2017-12-31 ENCOUNTER — Ambulatory Visit: Payer: Managed Care, Other (non HMO)

## 2017-12-31 DIAGNOSIS — I726 Aneurysm of vertebral artery: Secondary | ICD-10-CM | POA: Diagnosis not present

## 2017-12-31 LAB — POCT INR
AVAILABLE: 47.5
INR: 4 — AB (ref 2.0–3.0)

## 2017-12-31 NOTE — Patient Instructions (Signed)
Pt to stop meds for 3 days and then continue reg doses and f-up in 2 weeks on 01/14/18.  Dbs, cma

## 2018-01-10 ENCOUNTER — Other Ambulatory Visit: Payer: Self-pay | Admitting: Family Medicine

## 2018-01-10 DIAGNOSIS — G4700 Insomnia, unspecified: Secondary | ICD-10-CM

## 2018-01-14 ENCOUNTER — Ambulatory Visit (INDEPENDENT_AMBULATORY_CARE_PROVIDER_SITE_OTHER): Payer: Managed Care, Other (non HMO)

## 2018-01-14 DIAGNOSIS — Z23 Encounter for immunization: Secondary | ICD-10-CM | POA: Diagnosis not present

## 2018-01-14 DIAGNOSIS — Z952 Presence of prosthetic heart valve: Secondary | ICD-10-CM

## 2018-01-14 LAB — POCT INR: INR: 3.4 — AB (ref 2.0–3.0)

## 2018-02-03 ENCOUNTER — Ambulatory Visit: Payer: Managed Care, Other (non HMO)

## 2018-02-04 ENCOUNTER — Ambulatory Visit (INDEPENDENT_AMBULATORY_CARE_PROVIDER_SITE_OTHER): Payer: Managed Care, Other (non HMO)

## 2018-02-04 DIAGNOSIS — Z952 Presence of prosthetic heart valve: Secondary | ICD-10-CM | POA: Diagnosis not present

## 2018-02-04 LAB — POCT INR
INR: 2.5 (ref 2.0–3.0)
Prothrombin Time: 30.1

## 2018-02-04 NOTE — Patient Instructions (Signed)
Description   Dx: Mechanical Heart Valve (Z95.2) Continue on current dose 5mg  M,W,F all other days 10mg 

## 2018-03-04 ENCOUNTER — Ambulatory Visit (INDEPENDENT_AMBULATORY_CARE_PROVIDER_SITE_OTHER): Payer: Managed Care, Other (non HMO)

## 2018-03-04 DIAGNOSIS — Z952 Presence of prosthetic heart valve: Secondary | ICD-10-CM | POA: Diagnosis not present

## 2018-03-04 LAB — POCT INR
AVAILABLE: 21.7
INR: 1.8 — AB (ref 2.0–3.0)

## 2018-03-04 NOTE — Patient Instructions (Signed)
Description   Dx: Mechanical Heart Valve (Z95.2) Starting today 12/18-12/21 take 10mg  than resume back to 10mg  qd except M, W, F. Return: 3 weeks

## 2018-03-20 ENCOUNTER — Telehealth: Payer: Self-pay

## 2018-03-20 ENCOUNTER — Encounter: Payer: Self-pay | Admitting: Family Medicine

## 2018-03-20 ENCOUNTER — Ambulatory Visit (INDEPENDENT_AMBULATORY_CARE_PROVIDER_SITE_OTHER): Payer: Managed Care, Other (non HMO) | Admitting: Family Medicine

## 2018-03-20 VITALS — BP 134/65 | HR 54 | Temp 97.8°F | Resp 16 | Wt 204.0 lb

## 2018-03-20 DIAGNOSIS — R05 Cough: Secondary | ICD-10-CM

## 2018-03-20 DIAGNOSIS — R059 Cough, unspecified: Secondary | ICD-10-CM

## 2018-03-20 DIAGNOSIS — J40 Bronchitis, not specified as acute or chronic: Secondary | ICD-10-CM | POA: Diagnosis not present

## 2018-03-20 MED ORDER — HYDROCODONE-HOMATROPINE 5-1.5 MG/5ML PO SYRP: 5.0000 mL | ORAL_SOLUTION | Freq: Three times a day (TID) | ORAL | 0 refills | Status: AC | PRN

## 2018-03-20 MED ORDER — AZITHROMYCIN 250 MG PO TABS: ORAL_TABLET | ORAL | 0 refills | Status: AC

## 2018-03-20 NOTE — Telephone Encounter (Signed)
I can see him at 1:20

## 2018-03-20 NOTE — Progress Notes (Signed)
Patient: Jason Mcdaniel Male    DOB: 11/19/1945   73 y.o.   MRN: [id number] Visit Date: 03/20/2018  Today's Provider: Lelon Huh, MD   Chief Complaint  Patient presents with  . URI   Subjective:     URI   This is a new problem. Episode onset: 4-5 days ago. Associated symptoms include congestion, coughing (productive with white sputum), rhinorrhea, sinus pain, sneezing and a sore throat. Pertinent negatives include no abdominal pain, chest pain, ear pain, headaches, joint pain, joint swelling, nausea, plugged ear sensation, vomiting or wheezing. Treatments tried: Coricidin HBP. The treatment provided moderate relief.    Allergies  Allergen Reactions  . Penicillins Hives and Nausea Only    Has patient had a PCN reaction causing immediate rash, facial/tongue/throat swelling, SOB or lightheadedness with hypotension: Hives Has patient had a PCN reaction causing severe rash involving mucus membranes or skin necrosis: No Has patient had a PCN reaction that required hospitalization No Has patient had a PCN reaction occurring within the last 10 years: No If all of the above answers are "NO", then may proceed with Cephalosporin use.     Current Outpatient Medications:  .  amLODipine (NORVASC) 5 MG tablet, TAKE ONE TABLET TWICE DAILY, Disp: 60 tablet, Rfl: 11 .  aspirin 81 MG tablet, Take 81 mg by mouth daily., Disp: , Rfl:  .  calcium carbonate 200 MG capsule, Take 250 mg by mouth daily. , Disp: , Rfl:  .  carvedilol (COREG) 25 MG tablet, TAKE ONE TABLET BY MOUTH EVERY 12 HOURS, Disp: 60 tablet, Rfl: 12 .  Coenzyme Q10 (COQ10) 100 MG CAPS, Take 1 tablet by mouth daily., Disp: , Rfl:  .  Cyanocobalamin (RA VITAMIN B-12 TR) 1000 MCG TBCR, Take 1,000 mcg by mouth daily., Disp: , Rfl:  .  pravastatin (PRAVACHOL) 80 MG tablet, TAKE ONE TABLET BY MOUTH AT BEDTIME, Disp: 30 tablet, Rfl: 11 .  temazepam (RESTORIL) 15 MG capsule, TAKE 1 CAPSULE BY MOUTH AT BEDTIME AS NEEDED FOR SLEEP,  Disp: 30 capsule, Rfl: 5 .  warfarin (COUMADIN) 10 MG tablet, TAKE 1 TO 2 TABLETS AS DIRECTED, Disp: 60 tablet, Rfl: 5 .  warfarin (COUMADIN) 10 MG tablet, TAKE 1 TO 2 TABLETS AS DIRECTED, Disp: 60 tablet, Rfl: 5  Review of Systems  Constitutional: Negative for appetite change, chills and fever.  HENT: Positive for congestion, postnasal drip, rhinorrhea, sinus pressure, sinus pain, sneezing and sore throat. Negative for ear pain.   Respiratory: Positive for cough (productive with white sputum). Negative for chest tightness, shortness of breath and wheezing.   Cardiovascular: Negative for chest pain and palpitations.  Gastrointestinal: Negative for abdominal pain, nausea and vomiting.  Musculoskeletal: Negative for joint pain.  Neurological: Negative for headaches.    Social History   Tobacco Use  . Smoking status: Never Smoker  . Smokeless tobacco: Never Used  Substance Use Topics  . Alcohol use: Yes    Alcohol/week: 0.0 - 4.0 standard drinks      Objective:   BP 134/65 (BP Location: Right Arm, Cuff Size: Large)   Pulse (!) 54   Temp 97.8 F (36.6 C) (Oral)   Resp 16   Wt 204 lb (92.5 kg)   SpO2 99%   BMI 29.27 kg/m  Vitals:   03/20/18 1339 03/20/18 1341  BP: (!) 152/79 134/65  Pulse: (!) 54   Resp: 16   Temp: 97.8 F (36.6 C)   TempSrc: Oral  SpO2: 99%   Weight: 204 lb (92.5 kg)      Physical Exam  General Appearance:    Alert, cooperative, no distress  HENT:   ENT exam normal, no neck nodes or sinus tenderness, neck has bilateral anterior cervical nodes enlarged and nasal mucosa congested  Eyes:    PERRL, conjunctiva/corneas clear, EOM's intact       Lungs:     Occasional expiratory wheeze, no rales, , respirations unlabored  Heart:    Regular rate and rhythm  Neurologic:   Awake, alert, oriented x 3. No apparent focal neurological           defect.          Assessment & Plan    1. Bronchitis  - azithromycin (ZITHROMAX) 250 MG tablet; 2 by mouth  today, then 1 daily for 4 days  Dispense: 6 tablet; Refill: 0  2. Cough  - HYDROcodone-homatropine (HYCODAN) 5-1.5 MG/5ML syrup; Take 5 mLs by mouth every 8 (eight) hours as needed.  Dispense: 100 mL; Refill: 0  Call if symptoms change or if not rapidly improving.       Lelon Huh, MD  [facility]

## 2018-03-20 NOTE — Telephone Encounter (Signed)
Patient wanting a cough medication to be called in or what OTC cough syrup can he take. Patient reports coughing x's 3 days. Patient denies any fever, shortness of breath, or wheezing. Patient is coughing up clear mucus. [facility] CB# [phone] Patient was wanting to be seen but we are out of appointments.

## 2018-03-20 NOTE — Telephone Encounter (Signed)
Apt made for 1:20 this afternoon   Thanks,   -Mickel Baas

## 2018-03-20 NOTE — Patient Instructions (Signed)
.   Please bring all of your medications to every appointment so we can make sure that our medication list is the same as yours.   

## 2018-03-25 ENCOUNTER — Ambulatory Visit: Payer: Managed Care, Other (non HMO)

## 2018-04-01 ENCOUNTER — Ambulatory Visit (INDEPENDENT_AMBULATORY_CARE_PROVIDER_SITE_OTHER): Payer: Managed Care, Other (non HMO)

## 2018-04-01 DIAGNOSIS — Z952 Presence of prosthetic heart valve: Secondary | ICD-10-CM | POA: Diagnosis not present

## 2018-04-01 LAB — POCT INR
INR: 4.3 — AB (ref 2.0–3.0)
PT: 51.4

## 2018-04-01 NOTE — Patient Instructions (Signed)
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 Return: 2 weeks

## 2018-04-15 ENCOUNTER — Ambulatory Visit (INDEPENDENT_AMBULATORY_CARE_PROVIDER_SITE_OTHER): Payer: Managed Care, Other (non HMO)

## 2018-04-15 DIAGNOSIS — Z952 Presence of prosthetic heart valve: Secondary | ICD-10-CM | POA: Diagnosis not present

## 2018-04-15 LAB — POCT INR
INR: 2.7 (ref 2.0–3.0)
PT: 32.7

## 2018-04-15 NOTE — Patient Instructions (Signed)
Description   Dx: Mechanical Heart Valve (Z95.2) 0mg  qd except 5 mg on M, W, F. Return: 4 weeks

## 2018-05-01 DIAGNOSIS — D225 Melanocytic nevi of trunk: Secondary | ICD-10-CM | POA: Diagnosis not present

## 2018-05-01 DIAGNOSIS — D2272 Melanocytic nevi of left lower limb, including hip: Secondary | ICD-10-CM | POA: Diagnosis not present

## 2018-05-01 DIAGNOSIS — L821 Other seborrheic keratosis: Secondary | ICD-10-CM | POA: Diagnosis not present

## 2018-05-01 DIAGNOSIS — L570 Actinic keratosis: Secondary | ICD-10-CM | POA: Diagnosis not present

## 2018-05-01 DIAGNOSIS — D2261 Melanocytic nevi of right upper limb, including shoulder: Secondary | ICD-10-CM | POA: Diagnosis not present

## 2018-05-01 DIAGNOSIS — D2271 Melanocytic nevi of right lower limb, including hip: Secondary | ICD-10-CM | POA: Diagnosis not present

## 2018-05-01 DIAGNOSIS — D2262 Melanocytic nevi of left upper limb, including shoulder: Secondary | ICD-10-CM | POA: Diagnosis not present

## 2018-05-01 DIAGNOSIS — X32XXXA Exposure to sunlight, initial encounter: Secondary | ICD-10-CM | POA: Diagnosis not present

## 2018-05-13 ENCOUNTER — Ambulatory Visit (INDEPENDENT_AMBULATORY_CARE_PROVIDER_SITE_OTHER): Payer: Managed Care, Other (non HMO)

## 2018-05-13 DIAGNOSIS — Z952 Presence of prosthetic heart valve: Secondary | ICD-10-CM | POA: Diagnosis not present

## 2018-05-13 LAB — POCT INR
AVAILABLE: 39.7
INR: 3.3 — AB (ref 2.0–3.0)

## 2018-05-14 ENCOUNTER — Other Ambulatory Visit: Payer: Self-pay | Admitting: Family Medicine

## 2018-06-10 ENCOUNTER — Ambulatory Visit (INDEPENDENT_AMBULATORY_CARE_PROVIDER_SITE_OTHER): Payer: Managed Care, Other (non HMO) | Admitting: *Deleted

## 2018-06-10 ENCOUNTER — Other Ambulatory Visit: Payer: Self-pay

## 2018-06-10 DIAGNOSIS — Z952 Presence of prosthetic heart valve: Secondary | ICD-10-CM

## 2018-06-10 LAB — POCT INR
INR: 3.6 — AB (ref 2.0–3.0)
PT: 43.3

## 2018-06-10 NOTE — Patient Instructions (Signed)
Dx: Mechanical Heart Valve (Z95.2) Hold today's dose. Than take 10 mg M, W, F and 5 mg all other days Return: 3 weeks 07/01/2018

## 2018-07-01 ENCOUNTER — Ambulatory Visit (INDEPENDENT_AMBULATORY_CARE_PROVIDER_SITE_OTHER): Payer: Managed Care, Other (non HMO)

## 2018-07-01 ENCOUNTER — Other Ambulatory Visit: Payer: Self-pay

## 2018-07-01 DIAGNOSIS — Z952 Presence of prosthetic heart valve: Secondary | ICD-10-CM

## 2018-07-01 LAB — POCT INR
INR: 2.6 (ref 2.0–3.0)
PT: 31.1

## 2018-07-01 NOTE — Patient Instructions (Signed)
Description   Dx: Mechanical Heart Valve (Z95.2) Current coumadin dose: 10mg  on Monday, Wednesday and Friday;  5mg  all other days PT: 31.1 INR: 2.6 Today's Changes: NO CHANGE Recheck: 4 weeks

## 2018-07-09 ENCOUNTER — Other Ambulatory Visit: Payer: Self-pay | Admitting: Family Medicine

## 2018-07-09 DIAGNOSIS — G4700 Insomnia, unspecified: Secondary | ICD-10-CM

## 2018-07-29 ENCOUNTER — Other Ambulatory Visit: Payer: Self-pay

## 2018-07-29 ENCOUNTER — Ambulatory Visit (INDEPENDENT_AMBULATORY_CARE_PROVIDER_SITE_OTHER): Payer: Managed Care, Other (non HMO)

## 2018-07-29 DIAGNOSIS — Z952 Presence of prosthetic heart valve: Secondary | ICD-10-CM | POA: Diagnosis not present

## 2018-07-29 LAB — POCT INR
INR: 3.1 — AB (ref 2.0–3.0)
PT: 37.6

## 2018-07-29 NOTE — Patient Instructions (Signed)
Continue 10mg  M, W, F and 5mg  the rest of the week.  Recheck in four weeks.

## 2018-08-21 ENCOUNTER — Telehealth: Payer: Self-pay

## 2018-08-21 DIAGNOSIS — Z952 Presence of prosthetic heart valve: Secondary | ICD-10-CM | POA: Diagnosis not present

## 2018-08-21 DIAGNOSIS — I716 Thoracoabdominal aortic aneurysm, without rupture: Secondary | ICD-10-CM | POA: Diagnosis not present

## 2018-08-21 DIAGNOSIS — I712 Thoracic aortic aneurysm, without rupture: Secondary | ICD-10-CM | POA: Diagnosis not present

## 2018-08-21 DIAGNOSIS — Z8679 Personal history of other diseases of the circulatory system: Secondary | ICD-10-CM | POA: Diagnosis not present

## 2018-08-21 DIAGNOSIS — Z48812 Encounter for surgical aftercare following surgery on the circulatory system: Secondary | ICD-10-CM | POA: Diagnosis not present

## 2018-08-21 DIAGNOSIS — Z9889 Other specified postprocedural states: Secondary | ICD-10-CM | POA: Diagnosis not present

## 2018-08-21 NOTE — Telephone Encounter (Signed)
Patient's wife called stating that her husband's medication for his blood pressure needs to be adjusted because the bottom number is below 40. She needs someone to call back as soon as possible.

## 2018-08-21 NOTE — Telephone Encounter (Signed)
Pt advised.   Thanks,   -Kendrea Cerritos  

## 2018-08-21 NOTE — Telephone Encounter (Signed)
I spoke with Mrs. Negro she states Mr. Tugwell had an OV at [facility] today.  (Please see office note in Care everywhere)  Pt's diastolic bp was 38, she stated they recheck his BP and the diastolic was at 40.  She reports he has been very "fatigued" but denies any other symptom.  She states that you are the only one that adjusts his blood pressure medications.  She is requesting an answer today about medication changes because they are going out of town in the morning for vacation.   Please advise.   Thanks,   -Mickel Baas

## 2018-08-21 NOTE — Telephone Encounter (Signed)
Reduce coreg (carvedilol) to 1/2 tablet every twelve hours and let us know how BP is doing next week.

## 2018-09-02 ENCOUNTER — Other Ambulatory Visit: Payer: Self-pay

## 2018-09-02 ENCOUNTER — Ambulatory Visit (INDEPENDENT_AMBULATORY_CARE_PROVIDER_SITE_OTHER): Payer: Managed Care, Other (non HMO) | Admitting: *Deleted

## 2018-09-02 DIAGNOSIS — Z952 Presence of prosthetic heart valve: Secondary | ICD-10-CM

## 2018-09-02 LAB — POCT INR
INR: 2.2 (ref 2.0–3.0)
PT: 26.9

## 2018-09-02 NOTE — Patient Instructions (Signed)
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

## 2018-09-14 ENCOUNTER — Other Ambulatory Visit: Payer: Self-pay | Admitting: Family Medicine

## 2018-09-22 ENCOUNTER — Ambulatory Visit: Payer: Managed Care, Other (non HMO)

## 2018-09-22 ENCOUNTER — Encounter: Payer: Managed Care, Other (non HMO) | Admitting: Family Medicine

## 2018-09-22 ENCOUNTER — Other Ambulatory Visit: Payer: Self-pay

## 2018-09-22 ENCOUNTER — Encounter: Payer: Self-pay | Admitting: Family Medicine

## 2018-09-22 ENCOUNTER — Ambulatory Visit (INDEPENDENT_AMBULATORY_CARE_PROVIDER_SITE_OTHER): Payer: Managed Care, Other (non HMO) | Admitting: Family Medicine

## 2018-09-22 VITALS — BP 138/52 | HR 59 | Temp 97.8°F | Resp 16 | Ht 70.0 in | Wt 203.0 lb

## 2018-09-22 DIAGNOSIS — Z125 Encounter for screening for malignant neoplasm of prostate: Secondary | ICD-10-CM

## 2018-09-22 DIAGNOSIS — I726 Aneurysm of vertebral artery: Secondary | ICD-10-CM

## 2018-09-22 DIAGNOSIS — I7101 Dissection of thoracic aorta: Secondary | ICD-10-CM | POA: Diagnosis not present

## 2018-09-22 DIAGNOSIS — I712 Thoracic aortic aneurysm, without rupture, unspecified: Secondary | ICD-10-CM

## 2018-09-22 DIAGNOSIS — D689 Coagulation defect, unspecified: Secondary | ICD-10-CM

## 2018-09-22 DIAGNOSIS — Z0000 Encounter for general adult medical examination without abnormal findings: Secondary | ICD-10-CM | POA: Diagnosis not present

## 2018-09-22 DIAGNOSIS — I2510 Atherosclerotic heart disease of native coronary artery without angina pectoris: Secondary | ICD-10-CM

## 2018-09-22 NOTE — Progress Notes (Signed)
Patient: Jason Mcdaniel, Male    DOB: Mar 31, 1945, 73 y.o.   MRN: [id number] Visit Date: 09/22/2018  Today's Provider: Lelon Huh, MD   Chief Complaint  Patient presents with  . Annual Exam  . Medicare Wellness   Subjective:     Annual wellness visit Jason Mcdaniel is a 73 y.o. male. He feels fairly well. He reports exercising daily. He reports he is sleeping well.  -----------------------------------------------------------  Hypertension, follow-up:  BP Readings from Last 3 Encounters:  09/22/18 (!) 138/52  03/20/18 134/65  09/16/17 (!) 116/56    He was last seen for hypertension 1 years ago.  BP at that visit was 124/50. Management since that visit includes reducing Coreg to 1/2 tablet every 12 hours due to low blood pressure reading ay home. He reports good compliance with treatment. He is not having side effects.  He is exercising. He is adherent to low salt diet.   Outside blood pressures are normal per patient report. He is experiencing none.  Patient denies chest pain, chest pressure/discomfort, claudication, dyspnea, exertional chest pressure/discomfort, fatigue, irregular heart beat, lower extremity edema, near-syncope, orthopnea, palpitations, paroxysmal nocturnal dyspnea, syncope and tachypnea.   Cardiovascular risk factors include advanced age (older than 67 for men, 12 for women), dyslipidemia, hypertension and male gender.  Use of agents associated with hypertension: NSAIDS.     Weight trend: stable Wt Readings from Last 3 Encounters:  09/22/18 203 lb (92.1 kg)  03/20/18 204 lb (92.5 kg)  09/16/17 210 lb 6.4 oz (95.4 kg)    Current diet: in general, a "healthy" diet    ------------------------------------------------------------------------  Lipid/Cholesterol, Follow-up:   Last seen for this 1 years ago.  Management changes since that visit include none. . Last Lipid Panel:    Component Value Date/Time   CHOL 164 09/25/2017 0812    TRIG 108 09/25/2017 0812   HDL 36 (L) 09/25/2017 0812   CHOLHDL 4.6 09/25/2017 0812   LDLCALC 106 (H) 09/25/2017 5009    Risk factors for vascular disease include hypercholesterolemia and hypertension  He reports good compliance with treatment. He is not having side effects.  Current symptoms include none and have been stable. Weight trend: stable Prior visit with dietician: no Current diet: in general, a "healthy" diet   Current exercise: bicycling and walking  Wt Readings from Last 3 Encounters:  09/22/18 203 lb (92.1 kg)  03/20/18 204 lb (92.5 kg)  09/16/17 210 lb 6.4 oz (95.4 kg)    ------------------------------------------------------------------- He has history or aortic dissection s/p repair and had routine surveillance imaging at [facility] on 08/21/2018. Recommendations were to repeat in 24 months. He had some mid-back pain at that time and reports that Dr. Ysidro Evert recommended he see a spine specialist.  He also has history of vertebral artery aneurysm, last CTA 6/12/218 was stable. Radiologist at that time reports lesion may be a vessel infundibulum and recommended repeating in 2020, and discontinuing surveillance if still unchanged.   Review of Systems  Constitutional: Negative for appetite change, chills, fatigue and fever.  HENT: Negative for congestion, ear pain, hearing loss, nosebleeds and trouble swallowing.   Eyes: Negative for pain and visual disturbance.  Respiratory: Negative for cough, chest tightness and shortness of breath.   Cardiovascular: Negative for chest pain, palpitations and leg swelling.  Gastrointestinal: Negative for abdominal pain, blood in stool, constipation, diarrhea, nausea and vomiting.  Endocrine: Negative for polydipsia, polyphagia and polyuria.  Genitourinary: Negative for dysuria and flank pain.  Musculoskeletal: Negative for arthralgias, back pain, joint swelling, myalgias and neck stiffness.  Skin: Negative for color change, rash and  wound.  Neurological: Negative for dizziness, tremors, seizures, speech difficulty, weakness, light-headedness and headaches.  Psychiatric/Behavioral: Negative for behavioral problems, confusion, decreased concentration, dysphoric mood and sleep disturbance. The patient is not nervous/anxious.   All other systems reviewed and are negative.   Social History   Socioeconomic History  . Marital status: Married    Spouse name: Not on file  . Number of children: 2  . Years of education: Not on file  . Highest education level: Some college, no degree  Occupational History  . Occupation: Retired  Scientific laboratory technician  . Financial resource strain: Not hard at all  . Food insecurity    Worry: Never true    Inability: Never true  . Transportation needs    Medical: No    Non-medical: No  Tobacco Use  . Smoking status: Never Smoker  . Smokeless tobacco: Never Used  Substance and Sexual Activity  . Alcohol use: Yes    Alcohol/week: 0.0 - 4.0 standard drinks  . Drug use: No  . Sexual activity: Not on file  Lifestyle  . Physical activity    Days per week: Not on file    Minutes per session: Not on file  . Stress: Not at all  Relationships  . Social Herbalist on phone: Not on file    Gets together: Not on file    Attends religious service: Not on file    Active member of club or organization: Not on file    Attends meetings of clubs or organizations: Not on file    Relationship status: Not on file  . Intimate partner violence    Fear of current or ex partner: Not on file    Emotionally abused: Not on file    Physically abused: Not on file    Forced sexual activity: Not on file  Other Topics Concern  . Not on file  Social History Narrative  . Not on file    Past Medical History:  Diagnosis Date  . AAA (abdominal aortic aneurysm) ([place])   . Ascending aortic dissection ([place]) 02/09/2002   straight graft replacement of ascending thoracic aorta for acute type A aortic  dissection (DeBakey type II) with hemiarch distal arch anastamosis and supracoronary proximal anastamosis   . 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 Chest pain, unspecified   . Coronary artery disease   . Cough    CHRONIC  . 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 Descending thoracic aortic dissection ([place]) 04/25/2006   Acute Type B aortic dissection   . Dysrhythmia   . Edema    FEET/LEGS  . Epistaxis 09/05/2011   07/2011 - Seen by ENT - resolved.  [name] History of aortic valve replacement    a.  St. Jude mechanical valve via right mini thoracotomy by Dr Gerrit Friends at [facility] ;  b. 02/2011 Echo EF 55-65%, Gr 2 DD, Triv AI, Mild MR.  [name] History of thoracic aortic aneurysm repair 02/09/2002   straight graft replacement of ascending thoracic aorta for acute type A aortic dissection (DeBakey type II) with hemiarch distal arch anastamosis and supracoronary proximal anastamosis   . HOH (hard of hearing)   . 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 Hyperlipidemia, mixed   .  Hypertension    unspecified  . Myocardial infarction ([place]) 2004  . Peripheral vascular disease ([place])   . Stroke ([place]) 1999  . TIA (transient ischemic attack) 09/05/2011   Attributed to antihypertensive medications     Patient Active Problem List   Diagnosis Date Noted  . Vertebral artery aneurysm ([place]) 12/20/2015  . Right hip pain 08/09/2015  . Seizure ([place]) 12/20/2014  . Coagulation disorder ([place]) 12/20/2014  . Dissecting aortic aneurysm, thoracic ([place]) 12/20/2014  . 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 Gout 12/20/2014  . Plantar fasciitis 12/20/2014  . Error, refractive, myopia 12/20/2014  . History of BPH 12/20/2014  . Mitral and aortic regurgitation 12/20/2014  . H/O abdominal aortic aneurysm repair 10/05/2014  . Left flank pain 10/23/2012  . Headache(784.0) 02/03/2012  . 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 Other specified abnormal immunological findings in serum 12/09/2011  . Aortic heart valve narrowing 12/01/2011  . Cardiac murmur 12/01/2011  . Cerebrovascular accident, old 12/01/2011  . Dissection of aorta, thoracic ([place]) 09/23/2011  . Thoracic aneurysm without mention of rupture 09/23/2011  . History of thoracic aortic aneurysm repair 09/23/2011  . TIA (transient ischemic attack) 09/05/2011  . 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 History of aortic valve replacement 03/06/2011  . Sinus bradycardia 03/06/2011  . Right bundle branch block 03/06/2011  . Hyperlipidemia 08/11/2008  . Essential hypertension 08/11/2008  . History of open heart surgery 06/20/2008  . Insomnia 03/19/2003  . Ascending aortic dissection ([place]) 02/09/2002  . CAD in native artery 03/18/1997    Past Surgical History:  Procedure Laterality Date  . ABDOMINAL AORTIC ANEURYSM REPAIR  12/02/11   [facility]  . CARDIAC CATHETERIZATION    . CARDIAC VALVE REPLACEMENT  2001   aortic valve replaced  . CATARACT EXTRACTION W/PHACO Left 09/26/2015   Procedure: CATARACT EXTRACTION PHACO AND INTRAOCULAR LENS PLACEMENT (IOC);  Surgeon: Birder Robson, MD;  Location: [facility] ORS;  Service: Ophthalmology;  Laterality: Left;  [place] 30.0 AP% 23.4 CDE 7.08 Fluid pack lot # [id number]  . CATARACT EXTRACTION W/PHACO Right 10/10/2015   Procedure: CATARACT EXTRACTION PHACO AND INTRAOCULAR LENS PLACEMENT (IOC);  Surgeon: Birder Robson, MD;  Location: [facility] ORS;  Service: Ophthalmology;  Laterality: Right;  [place] 38.4 AP% 18.7 CDE 7.18 Fluid pack lot # [id number]  . VASCULAR SURGERY    . VOCAL CORD INJECTION  2015    His family history includes Goiter in his mother; Heart attack in his brother, brother, father, and sister; Hypertension in his father and sister; Suicidality in his son.   Current Outpatient Medications:  .  amLODipine (NORVASC) 5 MG tablet, TAKE ONE TABLET TWICE DAILY,  Disp: 60 tablet, Rfl: 11 .  aspirin 81 MG tablet, Take 81 mg by mouth daily., Disp: , Rfl:  .  calcium carbonate 200 MG capsule, Take 250 mg by mouth daily. , Disp: , Rfl:  .  carvedilol (COREG) 25 MG tablet, TAKE ONE TABLET BY MOUTH EVERY 12 HOURS, Disp: 60 tablet, Rfl: 12 .  Coenzyme Q10 (COQ10) 100 MG CAPS, Take 1 tablet by mouth daily., Disp: , Rfl:  .  Cyanocobalamin (RA VITAMIN B-12 TR) 1000 MCG TBCR, Take 1,000 mcg by mouth daily., Disp: , Rfl:  .  HYDROcodone-homatropine (HYCODAN) 5-1.5 MG/5ML syrup, Take 5 mLs by mouth every 8 (eight) hours as needed., Disp: 100 mL, Rfl: 0 .  pravastatin (PRAVACHOL) 80 MG tablet, TAKE ONE TABLET BY MOUTH AT BEDTIME, Disp: 30 tablet, Rfl: 11 .  temazepam (RESTORIL) 15 MG capsule, TAKE 1 CAPSULE BY MOUTH AT BEDTIME AS NEEDED FOR SLEEP, Disp: 30 capsule, Rfl: 3 .  warfarin (COUMADIN) 10 MG tablet, TAKE 1 TO 2 TABLETS AS DIRECTED, Disp: 60 tablet, Rfl: 5 .  warfarin (COUMADIN) 10 MG tablet, TAKE 1 TO 2 TABLETS AS DIRECTED, Disp: 60 tablet, Rfl: 5 .  warfarin (COUMADIN) 10 MG tablet, TAKE 1 TO 2 TABLETS BY MOUTH AS DIRECTED, Disp: 60 tablet, Rfl: 5  Patient Care Team: Birdie Sons, MD as PCP - General (Family Medicine) Minna Merritts, MD as Consulting Physician (Cardiology) Anson Crofts, MD as Attending Physician (Cardiothoracic Surgery) Birder Robson, MD as Referring Physician (Ophthalmology)    Objective:    Vitals: BP (!) 138/52 (BP Location: Right Arm, Patient Position: Sitting, Cuff Size: Large)   Pulse (!) 59   Temp 97.8 F (36.6 C) (Oral)   Resp 16   Ht 5\' 10"  (1.778 m)   Wt 203 lb (92.1 kg)   SpO2 96% Comment: room air  BMI 29.13 kg/m   Physical Exam   General Appearance:    Alert, cooperative, no distress, appears stated age  Head:    Normocephalic, without obvious abnormality, atraumatic  Eyes:    PERRL, conjunctiva/corneas clear, EOM's intact, fundi    benign, both eyes       Ears:    Normal TM's and external  ear canals, both ears  Nose:   Nares normal, septum midline, mucosa normal, no drainage   or sinus tenderness  Throat:   Lips, mucosa, and tongue normal; teeth and gums normal  Neck:   Supple, symmetrical, trachea midline, no adenopathy;       thyroid:  No enlargement/tenderness/nodules; no carotid   bruit or JVD  Back:     Symmetric, no curvature, ROM normal, no CVA tenderness  Lungs:     Clear to auscultation bilaterally, respirations unlabored  Chest wall:    No tenderness or deformity  Heart:    Regular rate and rhythm, metallic click and IIII/VI systolic murmur  Abdomen:     Soft, non-tender, bowel sounds active all four quadrants,    no masses, no organomegaly  Genitalia:    deferred  Rectal:    deferred  Extremities:   Extremities normal, atraumatic, no cyanosis or edema  Pulses:   2+ and symmetric all extremities  Skin:   Skin color, texture, turgor normal, no rashes or lesions  Lymph nodes:   Cervical, supraclavicular, and axillary nodes normal  Neurologic:   CNII-XII intact. Normal strength, sensation and reflexes      throughout    Activities of Daily Living In your present state of health, do you have any difficulty performing the following activities: 09/22/2018  Hearing? N  Vision? N  Difficulty concentrating or making decisions? N  Walking or climbing stairs? N  Dressing or bathing? N  Doing errands, shopping? N  Some recent data might be hidden    Fall Risk Assessment Fall Risk  09/22/2018 09/16/2017 08/06/2016 08/09/2015 08/09/2015  Falls in the past year? 0 No No No No  Number falls in past yr: 0 - - - -  Injury with Fall? 0 - - - -  Follow up Falls evaluation completed - - - -     Depression Screen PHQ 2/9 Scores 09/22/2018 09/16/2017 09/16/2017 08/06/2016  PHQ - 2 Score 0 0 0 0  PHQ- 9 Score 0 2 - 3    6CIT Screen 08/06/2016  What  Year? 0 points  What month? 0 points  What time? 0 points  Count back from 20 0 points  Months in reverse 2 points  Repeat phrase 8  points  Total Score 10    Cognitive Testing - 6-CIT  Correct? Score   What year is it? yes 0 0 or 4  What month is it? yes 0 0 or 3  Memorize:    Pia Mau,  40,  [address],  [place],      What time is it? (within 1 hour) yes 0 0 or 3  Count backwards from 20 yes 0 0, 2, or 4  Name the months of the year no 2 0, 2, or 4  Repeat name & address above no 3 0, 2, 4, 6, 8, or 10       TOTAL SCORE  5/28   Interpretation:  Normal  Normal (0-7) Abnormal (8-28)       Assessment & Plan:     Annual Wellness Visit  Reviewed patient's Family Medical History Reviewed and updated list of patient's medical providers Assessment of cognitive impairment was done Assessed patient's functional ability Established a written schedule for health screening [place] Completed and Reviewed  Exercise Activities and Dietary recommendations Goals    . DIET - INCREASE WATER INTAKE     Recommend increasing water intake to 6 glasses a day.     . Increase water intake     Recommend increasing water intake to 6 glasses every day.       Immunization History  Administered Date(s) Administered  . Influenza, High Dose Seasonal PF 12/20/2014, 12/20/2015, 12/25/2016, 01/14/2018  . Pneumococcal Conjugate-13 05/19/2013  . Pneumococcal Polysaccharide-23 08/25/2009, 11/01/2012  . Tdap 01/02/2011  . Zoster 02/15/2010  . Zoster Recombinat (Shingrix) 09/16/2017    Health Maintenance  Topic Date Due  . INFLUENZA VACCINE  10/17/2018  . Fecal DNA (Cologuard)  12/27/2018  . TETANUS/TDAP  01/01/2021  . Hepatitis C Screening  Completed  . PNA vac Low Risk Adult  Completed     Discussed health benefits of physical activity, and encouraged him to engage in regular exercise appropriate for his age and condition.    ------------------------------------------------------------------------------------------------------------ 1. Annual physical exam   2. Medicare annual wellness  visit, subsequent   3. Dissecting aortic aneurysm, thoracic ([place]) Up to date on imaging surveillance, followed by [facility]sidro [facility]vert [facility]  4. . Coagulation disorder ([place])   5. Prostate cancer screening  - PSA  6. CAD in native artery  - Comprehensive metabolic panel - Lipid panel - CBC  7. Vertebral artery aneurysm (HCC)  - CT ANGIO HEAD W OR WO CONTRAST; Future    Lelon Huh, MD  [facility]

## 2018-09-22 NOTE — Patient Instructions (Signed)
.   Please review the attached list of medications and notify my office if there are any errors.   . Please bring all of your medications to every appointment so we can make sure that our medication list is the same as yours.   . We will have flu vaccines available after Labor Day. Please go to your pharmacy or call the office in early September to schedule you flu shot.   

## 2018-09-25 ENCOUNTER — Telehealth: Payer: Self-pay

## 2018-09-25 LAB — COMPREHENSIVE METABOLIC PANEL
ALT: 11 IU/L (ref 0–44)
AST: 19 IU/L (ref 0–40)
Albumin/Globulin Ratio: 1.9 (ref 1.2–2.2)
Albumin: 4.3 g/dL (ref 3.7–4.7)
Alkaline Phosphatase: 93 IU/L (ref 39–117)
BUN/Creatinine Ratio: 16 (ref 10–24)
BUN: 18 mg/dL (ref 8–27)
Bilirubin Total: 0.5 mg/dL (ref 0.0–1.2)
CO2: 23 mmol/L (ref 20–29)
Calcium: 9.3 mg/dL (ref 8.6–10.2)
Chloride: 103 mmol/L (ref 96–106)
Creatinine, Ser: 1.16 mg/dL (ref 0.76–1.27)
GFR calc Af Amer: 71 mL/min/{1.73_m2} (ref >59)
GFR calc non Af Amer: 62 mL/min/{1.73_m2} (ref >59)
Globulin, Total: 2.3 g/dL (ref 1.5–4.5)
Glucose: 93 mg/dL (ref 65–99)
Potassium: 4.3 mmol/L (ref 3.5–5.2)
Sodium: 139 mmol/L (ref 134–144)
Total Protein: 6.6 g/dL (ref 6.0–8.5)

## 2018-09-25 LAB — CBC
Hematocrit: 41.3 % (ref 37.5–51.0)
Hemoglobin: 13.9 g/dL (ref 13.0–17.7)
MCH: 29 pg (ref 26.6–33.0)
MCHC: 33.7 g/dL (ref 31.5–35.7)
MCV: 86 fL (ref 79–97)
Platelets: 133 10*3/uL — ABNORMAL LOW (ref 150–450)
RBC: 4.8 x10E6/uL (ref 4.14–5.80)
RDW: 14.7 % (ref 11.6–15.4)
WBC: 6 10*3/uL (ref 3.4–10.8)

## 2018-09-25 LAB — PSA: Prostate Specific Ag, Serum: 2.5 ng/mL (ref 0.0–4.0)

## 2018-09-25 LAB — LIPID PANEL
Chol/HDL Ratio: 4.2 ratio (ref 0.0–5.0)
Cholesterol, Total: 151 mg/dL (ref 100–199)
HDL: 36 mg/dL — ABNORMAL LOW (ref >39)
LDL Calculated: 91 mg/dL (ref 0–99)
Triglycerides: 119 mg/dL (ref 0–149)
VLDL Cholesterol Cal: 24 mg/dL (ref 5–40)

## 2018-09-25 NOTE — Telephone Encounter (Signed)
-----   Message from Birdie Sons, MD sent at 09/25/2018 12:46 PM EDT ----- Labs are good, Continue current medications.  Check yearly.

## 2018-09-25 NOTE — Telephone Encounter (Signed)
LMTCB 09/25/2018   Thanks,   -Mickel Baas

## 2018-09-28 ENCOUNTER — Encounter: Payer: Self-pay | Admitting: Family Medicine

## 2018-09-28 NOTE — Telephone Encounter (Signed)
Pt advised.   Thanks,   -Laura  

## 2018-09-30 ENCOUNTER — Ambulatory Visit: Payer: Managed Care, Other (non HMO)

## 2018-10-06 ENCOUNTER — Ambulatory Visit (INDEPENDENT_AMBULATORY_CARE_PROVIDER_SITE_OTHER): Payer: Managed Care, Other (non HMO)

## 2018-10-06 ENCOUNTER — Ambulatory Visit: Payer: Managed Care, Other (non HMO)

## 2018-10-06 ENCOUNTER — Other Ambulatory Visit: Payer: Self-pay | Admitting: Family Medicine

## 2018-10-06 ENCOUNTER — Other Ambulatory Visit: Payer: Self-pay

## 2018-10-06 DIAGNOSIS — Z952 Presence of prosthetic heart valve: Secondary | ICD-10-CM | POA: Diagnosis not present

## 2018-10-06 LAB — POCT INR
INR: 2.5 (ref 2.0–3.0)
PT: 30.6

## 2018-10-06 NOTE — Patient Instructions (Signed)
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

## 2018-10-13 ENCOUNTER — Ambulatory Visit
Admission: RE | Admit: 2018-10-13 | Discharge: 2018-10-13 | Disposition: A | Discharge status: Home / Self Care | Payer: Managed Care, Other (non HMO) | Source: Ambulatory Visit | Attending: Family Medicine | Admitting: Family Medicine

## 2018-10-13 ENCOUNTER — Other Ambulatory Visit: Payer: Self-pay

## 2018-10-13 ENCOUNTER — Telehealth: Payer: Self-pay | Admitting: Family Medicine

## 2018-10-13 DIAGNOSIS — I726 Aneurysm of vertebral artery: Secondary | ICD-10-CM | POA: Diagnosis present

## 2018-10-13 DIAGNOSIS — G8929 Other chronic pain: Secondary | ICD-10-CM

## 2018-10-13 DIAGNOSIS — M549 Dorsalgia, unspecified: Secondary | ICD-10-CM

## 2018-10-13 MED ORDER — IOHEXOL 350 MG/ML SOLN
75.0000 mL | Freq: Once | INTRAVENOUS | Status: AC | PRN
  Administered 2018-10-13: 15:00:00 75 mL via INTRAVENOUS

## 2018-10-13 NOTE — Telephone Encounter (Signed)
° °  Pt had a head MRI done.  Pt is having trouble with his back and Dr. Ysidro Evert sent a note to Dr. Caryn Section stating pt needs to see a spine specialist.  Was thinking the MRI was supposed to be for his back.  Please call pt back to advise why the head MRI was done.  Thanks, [employer]

## 2018-10-14 NOTE — Telephone Encounter (Signed)
-----   Message from Birdie Sons, MD sent at 10/14/2018 10:45 AM EDT ----- Small aneurysm in vertebral artery is unchanged. Since it has not changed in several years, its very unlikely to every cause problems and there is no need to any more routine head CTs in the future.

## 2018-10-14 NOTE — Telephone Encounter (Signed)
The CT was for follow up of aneursym in head and neck. Results of head CT are pending.   We need to get plain film x-rays of back first. If xrays are normal, insurance will require a trial of physical therapy before they will approve MRI because MRIs are so expensive.    Is pain just in lower back, or does it go up into mid and upper back?

## 2018-10-14 NOTE — Telephone Encounter (Signed)
LMTCB 10/14/2018   Thanks,   -Mickel Baas

## 2018-10-16 NOTE — Telephone Encounter (Signed)
Patient and wife advised of CT scan.   They are wanting Jason Mcdaniel to have back xrays due to his back pain which is in the middle of his back, does not radiate but sometimes he has to lay in the floor to get the pain to stop.     Please let patient know when the requisition is ready for him to have the back xray.

## 2018-10-16 NOTE — Telephone Encounter (Signed)
Pt advised.   Thanks,   -Macen Joslin  

## 2018-10-16 NOTE — Telephone Encounter (Signed)
Has been ordered. Can go to [facility] at his convenience.

## 2018-10-20 ENCOUNTER — Other Ambulatory Visit: Payer: Self-pay | Admitting: Family Medicine

## 2018-10-20 DIAGNOSIS — G4700 Insomnia, unspecified: Secondary | ICD-10-CM

## 2018-10-21 ENCOUNTER — Other Ambulatory Visit: Payer: Self-pay

## 2018-10-21 ENCOUNTER — Ambulatory Visit
Admission: RE | Admit: 2018-10-21 | Discharge: 2018-10-21 | Disposition: A | Discharge status: Home / Self Care | Payer: Managed Care, Other (non HMO) | Source: Ambulatory Visit | Attending: Family Medicine | Admitting: Family Medicine

## 2018-10-21 DIAGNOSIS — M549 Dorsalgia, unspecified: Secondary | ICD-10-CM | POA: Diagnosis present

## 2018-10-21 DIAGNOSIS — G8929 Other chronic pain: Secondary | ICD-10-CM | POA: Insufficient documentation

## 2018-10-21 DIAGNOSIS — M4316 Spondylolisthesis, lumbar region: Secondary | ICD-10-CM | POA: Diagnosis not present

## 2018-10-21 DIAGNOSIS — M4184 Other forms of scoliosis, thoracic region: Secondary | ICD-10-CM | POA: Diagnosis not present

## 2018-10-22 ENCOUNTER — Telehealth: Payer: Self-pay

## 2018-10-22 DIAGNOSIS — G8929 Other chronic pain: Secondary | ICD-10-CM

## 2018-10-22 NOTE — Telephone Encounter (Signed)
Patient notified of results. Would like the referral for PT.

## 2018-11-03 ENCOUNTER — Other Ambulatory Visit: Payer: Self-pay

## 2018-11-03 ENCOUNTER — Encounter: Payer: Self-pay | Admitting: Physical Therapy

## 2018-11-03 ENCOUNTER — Ambulatory Visit: Payer: Managed Care, Other (non HMO) | Attending: Family Medicine | Admitting: Physical Therapy

## 2018-11-03 DIAGNOSIS — G8929 Other chronic pain: Secondary | ICD-10-CM | POA: Diagnosis present

## 2018-11-03 DIAGNOSIS — M6281 Muscle weakness (generalized): Secondary | ICD-10-CM

## 2018-11-03 DIAGNOSIS — M546 Pain in thoracic spine: Secondary | ICD-10-CM | POA: Insufficient documentation

## 2018-11-03 DIAGNOSIS — R293 Abnormal posture: Secondary | ICD-10-CM | POA: Diagnosis present

## 2018-11-03 DIAGNOSIS — M545 Low back pain, unspecified: Secondary | ICD-10-CM

## 2018-11-03 NOTE — Therapy (Signed)
[facility] [address] [place], [address] Phone: [phone]   Fax:  [phone]  Physical Therapy Evaluation  Patient Details  Name: Jason Mcdaniel MRN: [id number] Date of Birth: 1945-08-06 Referring Provider (PT): Birdie Sons, MD   Encounter Date: 11/03/2018  PT End of Session - 11/04/18 1546    Visit Number  1    Number of Visits  12    Date for PT Re-Evaluation  12/16/18    Authorization Type  [employer] reporting period from 11/03/2018    Authorization Time Period  Current Cert period: 08/17/930 - 12/16/2018 (latest PN: IE 11/03/2018)    Authorization - Visit Number  1    Authorization - Number of Visits  10    PT Start Time  0900    PT Stop Time  1000    PT Time Calculation (min)  60 min    Activity Tolerance  Patient tolerated treatment well;No increased pain    Behavior During Therapy  WFL for tasks assessed/performed       Past Medical History:  Diagnosis Date  . AAA (abdominal aortic aneurysm) ([place])   . Ascending aortic dissection ([place]) 02/09/2002   straight graft replacement of ascending thoracic aorta for acute type A aortic dissection (DeBakey type II) with hemiarch distal arch anastamosis and supracoronary proximal anastamosis   . 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 Coronary artery disease   . Descending thoracic aortic aneurysm [facility]) 09/05/2011   08/2011 CT: Interval enlargement of the associated descending thoracic aortic    . Descending thoracic aortic dissection ([place]) 04/25/2006   Acute Type B aortic dissection   . Dysrhythmia   . Epistaxis 09/05/2011   07/2011 - Seen by ENT - resolved.  [name] History of aortic valve replacement    a.  St. Jude mechanical valve via right mini thoracotomy by Dr Gerrit Friends at [facility] ;  b. 02/2011 Echo EF 55-65%, Gr 2 DD, Triv AI, Mild MR.  [name] History of thoracic aortic aneurysm repair 02/09/2002   straight graft replacement of  ascending thoracic aorta for acute type A aortic dissection (DeBakey type II) with hemiarch distal arch anastamosis and supracoronary proximal anastamosis   . HOH (hard of hearing)   . 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 Hypertension   . Myocardial infarction ([place]) 2004  . Peripheral vascular disease ([place])   . Stroke ([place]) 1999  . TIA (transient ischemic attack) 09/05/2011   Attributed to antihypertensive medications    Past Surgical History:  Procedure Laterality Date  . ABDOMINAL AORTIC ANEURYSM REPAIR  12/02/11   [facility]  . CARDIAC CATHETERIZATION    . CARDIAC VALVE REPLACEMENT  2001   aortic valve replaced with St Jude prosthetic valve  . CATARACT EXTRACTION W/PHACO Left 09/26/2015   Procedure: CATARACT EXTRACTION PHACO AND INTRAOCULAR LENS PLACEMENT (IOC);  Surgeon: Birder Robson, MD;  Location: [facility] ORS;  Service: Ophthalmology;  Laterality: Left;  [place] 30.0 AP% 23.4 CDE 7.08 Fluid pack lot # [id number]  . CATARACT EXTRACTION W/PHACO Right 10/10/2015   Procedure: CATARACT EXTRACTION PHACO AND INTRAOCULAR LENS PLACEMENT (IOC);  Surgeon: Birder Robson, MD;  Location: [facility] ORS;  Service: Ophthalmology;  Laterality: Right;  [place] 38.4 AP% 18.7 CDE 7.18 Fluid pack lot # [id number]  . VASCULAR SURGERY    . VOCAL CORD INJECTION  2015    There were no vitals filed for this visit.  Subjective Assessment - 11/04/18  1518    Subjective  Patient reports he had an operation and they cut the muscle in his back and this is when his back pain started. He and his doctor think that this may be the start of his back pain. He has stayed been pretty active and he taught karate for several years. Trained in gym lifting weights until January. Feels like his back has gradually gotten more painful over time and wanted to come to physical therapy to learn how to improve it.    Pertinent History  Patient is a 73 y.o. male who presents to outpatient physical therapy with a referral for medical diagnosis of chronic  bilateral back pain. This patient's chief complaints consist of gradually worsening left sided mid back pain that is aggravated by heavy lifting activities that started following surgery for abdominal aortic aneurism and dissection in 2013 leading to the following functional deficits: difficulty with heavy lifting such as bringing in groceries, carrying, karate kicks such as the roundhouse, prolonged sitting in unsupported chair, bending such as with gardening, sleeping. Relevant past medical history and comorbidities include history of abdominal aortic aneurism type A with surgical repair (2013), current aneurism of R vertebral artery, PVD, hx of MI (didn't bother him at all), hx TIA and stroke (couldn't walk for 6 months, now fully recovered except for some brain deficits he notices - has trouble with re-call, stroke followed back popping at chiropractor), HTN, horner syndrome (currently not bothering him), heart valve replacement (no troubles with it currently).    Diagnostic tests  see chart    Patient Stated Goals  teach me what to do to help my back.    Currently in Pain?  Yes    Pain Score  2    best 0/10, at worst 7/10   Pain Location  Back   about T11, L >R.   Pain Orientation  Left;Right;Posterior    Pain Descriptors / Indicators  Aching   sometimes it's "really bad" (for a long time thought it was a pinched nerve), most of the time it is just an ache.   Pain Type  Chronic pain    Pain Radiating Towards  Paresthesia: denies ever having it    Pain Onset  More than a month ago    Pain Frequency  Intermittent    Aggravating Factors   lifting a lot of heavy weight (heavy grocery bags), vacuuming, bending over to do something, round house kick < 3x), prolonged sitting makes hip hurt (while watching movies), sleeping (occasionally wakes him up, can go back to sleep if change positions, does not keep him from falling asleep), sitting in a backless chair.    Pain Relieving Factors  laying on floor,  time, stopping the agg activity. reaching for toes and straightening up x 3 morning and evening. If pain is provoked, usually feels better in about 15 min of doing helpful activities.    Effect of Pain on Daily Activities  difficulty with heavy lifting such as bringing in groceries, carrying, karate kicks such as the roundhouse, prolonged sitting in unsupported chair, bending such as with gardening, sleeping.        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     Leisure   Bicycle 2-3/x a week (about 4 miles a day but recently went to 17 miles, no pain), walking 2-3x/week (20-30 min, no pain), karate movements (blocking, kicking - not much, a little pain). Gardening (small, doesn't bother back except planting in the spring); yardwork (except grass mowing). [employer] (rifle).       Cognition   Overall Cognitive Status  Within Functional Limits for tasks assessed   reports some difficulty following stroke with recall     Observation/Other Assessments   Observations  see note from 11/03/2018    Focus on Therapeutic Outcomes (FOTO)   FOTO = 67       OBJECTIVE:  Baseline pain =  2/10  OBSERVATION/INSPECTION: Patient presents with slight shift to the R, rounded shoulders.  Long well healed incision from mid thoracic spine to L iliac crest.   SPINE MOTION Lumbar AROM:  *Indicates pain - Flexion: = toes to floor, 100% feels good, Slight R rib hump,  . - Extension: = 50% (normal range for him per report), feels good.  - Rotation: R = 100%, L = 90% (both feel good). - Side Flexion: R = 100%, L = 90% (both feel good).  PERIPHERAL JOINT MOTION (AROM/PROM in degrees):  *Indicates pain BUE WFL BLE WFL  STRENGTH:  *Indicates pain - BUE WFL approximately 5/5 Hip  - Flexion: R = 4+/5, L = 4+/5. - Extension: R = 4+/5, L = 5/5. - Abduction: R = 4+/5, L = 5/5. Knee - Ext: R = 5/5, L = 5/5. - Flex: R = 4+/5, L = 5/5. Ankle (seated position) - Dorsiflexion: R = 5/5, L = 5/5. - Eversion: R = 4+/5, L = 4+/5. - Able to heel walk and toe wall with no UE support  SPECIAL TESTS: Straight leg raise (SLR): R = negative, L = negative FABER: R = negative, L = negative.  ACCESSORY MOTION:  - Hypomobile to CPA along thoracic and lumbar spine.  PALPATION: - Noted for soft tissue disruption at L lumbar spine near T10 and indentation following the L rib near that level. Patient reports this is where he gets pain at times. Is unsure if rib was removed.   FUNCTIONAL MOBILITY: - Bed mobility: supine/prone <> sit and rolling independent - Transfers: sit <> stand independent - Gait: ambulation WFL for household and short community distances - Stairs: testing deferred due to lack of complaint with this activity  FUNCTIONAL/BALANCE TESTS:  Squat: to approximately 80 degrees with rounded back.   Lift floor to waist with box: starting with 9.5# box and increasing weight by 5# until pt states he feels this is as heavy as he should/can go safely. Patient lifted up to 34.5# using hip dominant lifting strategy (as in deadlift) but failed to maintain neutral spine. Noted for rounded back. Was able to lift in knee dominant technique (as in squat) when cued but back remained rounded.   Carry: able to carry 20# then 40# kettlebell ~75 feet. More difficult on L than R and patient reported it felt pretty heavy  and was starting to bother him and produce  concordant pain with L sided carry of 40# kettlebell. States he recently was moving 5 gallon buckets of liquid (approx 40#) and that caused him pain.   EDUCATION/COGNITION: Patient is alert and oriented X 4.  Objective measurements completed on examination: See above findings.    TREATMENT:  Current R vertebral artery aneurism Hx of vascular issues including AAA repaired with surgery in 2013  Therapeutic exercise: to centralize symptoms and improve ROM, strength, muscular endurance, and activity tolerance required for successful completion of functional activities.  - discussion of precautions for cervical spine mobilization and manipulation related to vertebral artery aneurism.  - Exercise purpose/form. Self management techniques. Education on diagnosis, prognosis, POC, anatomy and physiology of current condition.  - Discussed lifting techniques. - practiced lifting and carrying  Patient response to treatment:  Pt tolerated treatment well. Pt was able to complete all exercises with minimal to no lasting increase in pain or discomfort. Pt required multimodal cuing for proper technique and to facilitate improved neuromuscular control, strength, range of motion, and functional ability resulting in improved performance and form.     PT Education - 11/04/18 1546    Education Details  discussion of precautions for cervical spine mobilization and manipulation related to vertebral artery aneurism. Exercise purpose/form. Self management techniques. Education on diagnosis, prognosis, POC, anatomy and physiology of current condition. lifting techniques    Person(s) Educated  Patient    Methods  Explanation;Demonstration;Verbal cues    Comprehension  Verbalized understanding;Returned demonstration       PT Short Term Goals - 11/04/18 1549      PT SHORT TERM GOAL #1   Title  Be independent with initial home exercise program for self-management of symptoms.    Baseline  to be initiated at  session 2    Time  2    Period  Weeks    Status  New    Target Date  11/18/18        PT Long Term Goals - 11/04/18 1549      PT LONG TERM GOAL #1   Title  Be independent with a long-term home exercise program for self-management of symptoms.    Baseline  to be initiated at visit 2 (11/03/2018);    Time  6    Period  Weeks    Status  New    Target Date  12/16/18      PT LONG TERM GOAL #2   Title  Demonstrate improved FOTO score to equal or greater than 87 (30% increase from baseline) to demonstrate improvement in overall condition and self-reported functional ability.    Baseline  FOTO = 67 (11/03/2018);    Time  6    Period  Weeks    Status  New    Target Date  12/16/18      PT LONG TERM GOAL #3   Title  Be able to demonstrate floor to waist lift at least 40# x 10 reps with proper form without limitation due to current condition in order to improve ability to lift during usual activities.    Baseline  35# one rep with rounded back (11/03/2018);    Time  6    Period  Weeks    Status  New    Target Date  12/16/18      PT LONG TERM GOAL #4   Title  Patient will demonstrate deadlift and squat x 10 with 20# weight with proper form to  improve ability to improve lifting without pain.    Baseline  rounded back with both techniques (11/03/2018);    Time  6    Period  Weeks    Status  New    Target Date  12/16/18      PT LONG TERM GOAL #5   Title  Complete community, work and/or recreational activities without limitation due to current condition.    Baseline  ifficulty with heavy lifting such as bringing in groceries, carrying, karate kicks such as the roundhouse, prolonged sitting in unsupported chair, bending such as with gardening, sleeping. (11/03/2018);    Time  6    Period  Weeks    Status  New    Target Date  12/16/18       Plan - 11/04/18 1556    Clinical Impression Statement  Patient is a 73 y.o. male referred to outpatient physical therapy with a medical diagnosis of   chronic bilateral back pain who presents with signs and symptoms consistent with chronic mid-back pain L > R following surgery for abdominal aortic aneurism in 2013. Patient presents with significant postural, muscular strength/power/endurance, ROM, soft tissue, and motor control impairments that are limiting ability to complete heavy lifting such as bringing in groceries, carrying, karate kicks such as the roundhouse, prolonged sitting in unsupported chair, bending such as with gardening, and sleeping without difficulty. Patient will benefit from skilled physical therapy intervention to address current body structure impairments and activity limitations to improve function and work towards goals set in current POC in order to return to prior level of function or maximal functional improvement.    Personal Factors and Comorbidities  Age;Comorbidity 3+;Past/Current Experience;Time since onset of injury/illness/exacerbation    Comorbidities  history of abdominal aortic aneurism type A with surgical repair (2013), current aneurism of R vertebral artery, PVD, hx of MI (didn't bother him at all), hx TIA and stroke (couldn't walk for 6 months, now fully recovered except for some brain deficits he notices - has trouble with re-call, stroke followed back popping at chiropractor), HTN, horner syndrome (currently not bothering him), heart valve replacement (no troubles with it currently).    Examination-Activity Limitations  Sit;Sleep;Bend;Lift;Squat;Carry;Stand;Other   karate; gardening   Examination-Participation Restrictions  Other;Shop;Interpersonal Relationship   fitness activities, lifting, carrying, gardening, shopping   Stability/Clinical Decision Making  Stable/Uncomplicated    Clinical Decision Making  Low    Rehab Potential  Good    PT Frequency  2x / week    PT Duration  6 weeks    PT Treatment/Interventions  ADLs/Self Care Home Management;Cryotherapy;Moist Heat;Functional mobility training;Therapeutic  activities;Therapeutic exercise;Neuromuscular re-education;Patient/family education;Manual techniques;Scar mobilization;Passive range of motion;Dry needling;Joint Manipulations;Spinal Manipulations   joint mobilizations grades I-IV   PT Next Visit Plan  establish HEP, work on lifting techniques and functional strength, karate    PT Home Exercise Plan  to be established visit 2    Consulted and Agree with Plan of Care  Patient      Patient will benefit from skilled therapeutic intervention in order to improve the following deficits and impairments:  Decreased endurance, Difficulty walking, Hypomobility, Increased muscle spasms, Decreased cognition, Decreased knowledge of precautions, Decreased range of motion, Decreased scar mobility, Impaired perceived functional ability, Improper body mechanics, Pain, Postural dysfunction, Impaired flexibility, Increased fascial restricitons, Decreased strength, Decreased activity tolerance, Decreased coordination  Visit Diagnosis: 1. Pain in thoracic spine   2. Chronic bilateral low back pain without sciatica   3. Muscle weakness (generalized)   4. Abnormal  posture        Problem List Patient Active Problem List   Diagnosis Date Noted  . 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 Right hip pain 08/09/2015  . Seizure ([place]) 12/20/2014  . Coagulation disorder ([place]) 12/20/2014  . Dissecting aortic aneurysm, thoracic ([place]) 12/20/2014  . 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 Gout 12/20/2014  . Plantar fasciitis 12/20/2014  . Error, refractive, myopia 12/20/2014  . History of BPH 12/20/2014  . Mitral and aortic regurgitation 12/20/2014  . H/O abdominal aortic aneurysm repair 10/05/2014  . Left flank pain 10/23/2012  . Headache(784.0) 02/03/2012  . 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 Other specified abnormal immunological findings in serum 12/09/2011  . Aortic heart valve narrowing 12/01/2011  . Cardiac  murmur 12/01/2011  . Cerebrovascular accident, old 12/01/2011  . Dissection of aorta, thoracic ([place]) 09/23/2011  . Thoracic aneurysm without mention of rupture 09/23/2011  . History of thoracic aortic aneurysm repair 09/23/2011  . TIA (transient ischemic attack) 09/05/2011  . Descending thoracic aortic aneurysm ([place]) 09/05/2011  . History of mechanical aortic valve replacement 03/06/2011  . Sinus bradycardia 03/06/2011  . Right bundle branch block 03/06/2011  . Hyperlipidemia 08/11/2008  . Essential hypertension 08/11/2008  . History of open heart surgery 06/20/2008  . Insomnia 03/19/2003  . Ascending aortic dissection ([place]) 02/09/2002  . CAD in native artery 03/18/1997    Everlean Alstrom. Graylon Good, PT, DPT 11/04/18, 4:01 PM  [facility] [address], [place], [address] Phone: [phone]   Fax:  [phone]  Name: DENMAN PICHARDO MRN: [id number] Date of Birth: [birth year]

## 2018-11-04 ENCOUNTER — Ambulatory Visit (INDEPENDENT_AMBULATORY_CARE_PROVIDER_SITE_OTHER): Payer: Managed Care, Other (non HMO)

## 2018-11-04 DIAGNOSIS — Z952 Presence of prosthetic heart valve: Secondary | ICD-10-CM

## 2018-11-04 LAB — POCT INR
INR: 2.9 (ref 2.0–3.0)
PT: 34.8

## 2018-11-04 NOTE — Patient Instructions (Signed)
Description   5mg  q d except 10 mg M W F

## 2018-11-05 ENCOUNTER — Encounter: Payer: Self-pay | Admitting: Physical Therapy

## 2018-11-05 ENCOUNTER — Ambulatory Visit: Payer: Managed Care, Other (non HMO) | Admitting: Physical Therapy

## 2018-11-05 ENCOUNTER — Other Ambulatory Visit: Payer: Self-pay

## 2018-11-05 DIAGNOSIS — G8929 Other chronic pain: Secondary | ICD-10-CM

## 2018-11-05 DIAGNOSIS — R293 Abnormal posture: Secondary | ICD-10-CM

## 2018-11-05 DIAGNOSIS — M546 Pain in thoracic spine: Secondary | ICD-10-CM | POA: Diagnosis not present

## 2018-11-05 DIAGNOSIS — M6281 Muscle weakness (generalized): Secondary | ICD-10-CM

## 2018-11-05 NOTE — Therapy (Signed)
[facility] [address], [place], [address] Phone: [phone]   Fax:  [phone]  Physical Therapy Treatment  Patient Details  Name: Jason Mcdaniel MRN: [id number] Date of Birth: 1945/05/07 Referring Provider (PT): Birdie Sons, MD   Encounter Date: 11/05/2018  PT End of Session - 11/05/18 0904    Visit Number  2    Number of Visits  12    Date for PT Re-Evaluation  12/16/18    Authorization Type  [employer] reporting period from 11/03/2018    Authorization Time Period  Current Cert period: 7/56/4332 - 12/16/2018 (latest PN: IE 11/03/2018)    Authorization - Visit Number  2    Authorization - Number of Visits  10    PT Start Time  0903    PT Stop Time  0941    PT Time Calculation (min)  38 min    Activity Tolerance  Patient tolerated treatment well;No increased pain    Behavior During Therapy  WFL for tasks assessed/performed       Past Medical History:  Diagnosis Date  . AAA (abdominal aortic aneurysm) ([place])   . Ascending aortic dissection ([place]) 02/09/2002   straight graft replacement of ascending thoracic aorta for acute type A aortic dissection (DeBakey type II) with hemiarch distal arch anastamosis and supracoronary proximal anastamosis   . 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 Coronary artery disease   . Descending thoracic aortic aneurysm [facility]) 09/05/2011   08/2011 CT: Interval enlargement of the associated descending thoracic aortic    . Descending thoracic aortic dissection ([place]) 04/25/2006   Acute Type B aortic dissection   . Dysrhythmia   . Epistaxis 09/05/2011   07/2011 - Seen by ENT - resolved.  [name] History of aortic valve replacement    a.  St. Jude mechanical valve via right mini thoracotomy by Dr Gerrit Friends at [facility] ;  b. 02/2011 Echo EF 55-65%, Gr 2 DD, Triv AI, Mild MR.  [name] History of thoracic aortic aneurysm repair 02/09/2002   straight graft replacement of  ascending thoracic aorta for acute type A aortic dissection (DeBakey type II) with hemiarch distal arch anastamosis and supracoronary proximal anastamosis   . HOH (hard of hearing)   . 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 Hypertension   . Myocardial infarction ([place]) 2004  . Peripheral vascular disease ([place])   . Stroke ([place]) 1999  . TIA (transient ischemic attack) 09/05/2011   Attributed to antihypertensive medications    Past Surgical History:  Procedure Laterality Date  . ABDOMINAL AORTIC ANEURYSM REPAIR  12/02/11   [facility]  . CARDIAC CATHETERIZATION    . CARDIAC VALVE REPLACEMENT  2001   aortic valve replaced with St Jude prosthetic valve  . CATARACT EXTRACTION W/PHACO Left 09/26/2015   Procedure: CATARACT EXTRACTION PHACO AND INTRAOCULAR LENS PLACEMENT (IOC);  Surgeon: Birder Robson, MD;  Location: [facility] ORS;  Service: Ophthalmology;  Laterality: Left;  [place] 30.0 AP% 23.4 CDE 7.08 Fluid pack lot # [id number]  . CATARACT EXTRACTION W/PHACO Right 10/10/2015   Procedure: CATARACT EXTRACTION PHACO AND INTRAOCULAR LENS PLACEMENT (IOC);  Surgeon: Birder Robson, MD;  Location: [facility] ORS;  Service: Ophthalmology;  Laterality: Right;  [place] 38.4 AP% 18.7 CDE 7.18 Fluid pack lot # [id number]  . VASCULAR SURGERY    . VOCAL CORD INJECTION  2015    There were no vitals filed for this visit.  Subjective Assessment - 11/05/18  0904    Subjective  Patient reports he was a bit sore all over for the rest of the day after his last treatment session but it was "not bad" and it seemd fine to him. He has no pain upon arrival today.    Pertinent History  Patient is a 73 y.o. male who presents to outpatient physical therapy with a referral for medical diagnosis of chronic bilateral back pain. This patient's chief complaints consist of gradually worsening left sided mid back pain that is aggravated by heavy lifting activities that started following surgery for abdominal aortic aneurism and dissection in 2013 leading to the  following functional deficits: difficulty with heavy lifting such as bringing in groceries, carrying, karate kicks such as the roundhouse, prolonged sitting in unsupported chair, bending such as with gardening, sleeping. Relevant past medical history and comorbidities include history of abdominal aortic aneurism type A with surgical repair (2013), current aneurism of R vertebral artery, PVD, hx of MI (didn't bother him at all), hx TIA and stroke (couldn't walk for 6 months, now fully recovered except for some brain deficits he notices - has trouble with re-call, stroke followed back popping at chiropractor), HTN, horner syndrome (currently not bothering him), heart valve replacement (no troubles with it currently).    Diagnostic tests  see chart    Patient Stated Goals  teach me what to do to help my back.    Currently in Pain?  No/denies    Pain Onset  More than a month ago        TREATMENT:  Current R vertebral artery aneurism Hx of vascular issues including AAA repaired with surgery in 2013  Therapeutic exercise:to centralize symptoms and improve ROM, strength, muscular endurance, and activity tolerance required for successful completion of functional activities.  - Treadmill 2.5 mphat 0% grade. For improved lower extremity mobility, muscular endurance, and weightbearing activity tolerance; and to induce the analgesic effect of aerobic exercise, stimulate improved joint nutrition, and prepare body structures and systems for following interventions. X 5  Minutes during subjective exam.  - Hooklying bridge to strengthen glutes and back muscles. Cuing to keep abdominals tight and continue breathing. x10 reps with double leg stance - Hooklying single leg bridge to strengthen glutes and back muscles. Cuing to keep abdominals tight and continue breathing. x10 reps with time for appropriate rest, transition and instructions to complete activity correctly. - prone shoulder CARS/angel 2x10 - hip hinge  assessment progressing to hip hinge drill with stick held behind body, pressing into stick at sacrum to cue improved form x10 reps each side.  - squat with 20# kettelbell, 2x 10. Demo imporved form.  - demonstration of round-house kicks approx 4x each side (pt reports he gets pain hours later following more than 5 reps). No obvious or serious technique flaws that explain pain provocation.  - demonstration of several other karate motions (blocking, etc) that all involve quick and explosive motions involving the low back.  - single arm farmer's carry x 200 feet each side with 20# kettelbell to improve carry tolerance and trunk strength.  - stooped row bracing lumbar spine and trunk position flexed at hips approximately 45 degrees, pulling 5# dumbbells along thigh angle towards hip. 2x10 with breaks.  - Standing cervical thoracic extension/BUE flexion and serratus anterior activation, lat stretch, with foam roller up wall, 5 second holds, cuing for technique. x 10 plus time for instruction, transitions, and appropriate rest.  - Education on HEP including videos of himself completing the  exercises with audio cues from clinician.   HOME EXERCISE PROGRAM Videos of pt performing exercise with audio cuing from clinician on his phone: - prone shoulder CARS/Angel. 3x10 - hooklying single leg bridge, 3x10 each side.     PT Education - 11/05/18 0905    Education Details  Exercise purpose/form. Self management techniques    Person(s) Educated  Patient    Methods  Explanation;Demonstration;Tactile cues;Verbal cues    Comprehension  Verbalized understanding;Returned demonstration       PT Short Term Goals - 11/04/18 1549      PT SHORT TERM GOAL #1   Title  Be independent with initial home exercise program for self-management of symptoms.    Baseline  to be initiated at session 2    Time  2    Period  Weeks    Status  New    Target Date  11/18/18        PT Long Term Goals - 11/04/18 1549       PT LONG TERM GOAL #1   Title  Be independent with a long-term home exercise program for self-management of symptoms.    Baseline  to be initiated at visit 2 (11/03/2018);    Time  6    Period  Weeks    Status  New    Target Date  12/16/18      PT LONG TERM GOAL #2   Title  Demonstrate improved FOTO score to equal or greater than 87 (30% increase from baseline) to demonstrate improvement in overall condition and self-reported functional ability.    Baseline  FOTO = 67 (11/03/2018);    Time  6    Period  Weeks    Status  New    Target Date  12/16/18      PT LONG TERM GOAL #3   Title  Be able to demonstrate floor to waist lift at least 40# x 10 reps with proper form without limitation due to current condition in order to improve ability to lift during usual activities.    Baseline  35# one rep with rounded back (11/03/2018);    Time  6    Period  Weeks    Status  New    Target Date  12/16/18      PT LONG TERM GOAL #4   Title  Patient will demonstrate deadlift and squat x 10 with 20# weight with proper form to improve ability to improve lifting without pain.    Baseline  rounded back with both techniques (11/03/2018);    Time  6    Period  Weeks    Status  New    Target Date  12/16/18      PT LONG TERM GOAL #5   Title  Complete community, work and/or recreational activities without limitation due to current condition.    Baseline  ifficulty with heavy lifting such as bringing in groceries, carrying, karate kicks such as the roundhouse, prolonged sitting in unsupported chair, bending such as with gardening, sleeping. (11/03/2018);    Time  6    Period  Weeks    Status  New    Target Date  12/16/18            Plan - 11/06/18 1314    Clinical Impression Statement  Pt tolerated treatment well. Pt was able to complete all exercises with minimal to no lasting increase in pain or discomfort. Pt required multimodal cuing for proper technique and to facilitate improved neuromuscular  control, strength, range of motion, and functional ability resulting in improved performance and form. He demonstrated improved hip hinge and trunk posture for squats and lifting by end of session but would benefit from continued work to improve mobility and postural strength and endurance for improved posture and activity tolerance. Patient would benefit from continued physical therapy to address remaining impairments and functional limitations to work towards stated goals and return to PLOF or maximal functional independence.    Personal Factors and Comorbidities  Age;Comorbidity 3+;Past/Current Experience;Time since onset of injury/illness/exacerbation    Comorbidities  history of abdominal aortic aneurism type A with surgical repair (2013), current aneurism of R vertebral artery, PVD, hx of MI (didn't bother him at all), hx TIA and stroke (couldn't walk for 6 months, now fully recovered except for some brain deficits he notices - has trouble with re-call, stroke followed back popping at chiropractor), HTN, horner syndrome (currently not bothering him), heart valve replacement (no troubles with it currently).    Examination-Activity Limitations  Sit;Sleep;Bend;Lift;Squat;Carry;Stand;Other   karate; gardening   Examination-Participation Restrictions  Other;Shop;Interpersonal Relationship   fitness activities, lifting, carrying, gardening, shopping   Stability/Clinical Decision Making  Stable/Uncomplicated    Rehab Potential  Good    PT Frequency  2x / week    PT Duration  6 weeks    PT Treatment/Interventions  ADLs/Self Care Home Management;Cryotherapy;Moist Heat;Functional mobility training;Therapeutic activities;Therapeutic exercise;Neuromuscular re-education;Patient/family education;Manual techniques;Scar mobilization;Passive range of motion;Dry needling;Joint Manipulations;Spinal Manipulations   joint mobilizations grades I-IV   PT Next Visit Plan  establish HEP, work on lifting techniques and  functional strength, karate    PT [place] of pt performing exercise with audio cuing from clinician on his phone: single leg bridges and prone shoulder CARs/angels 3x10 each.    Consulted and Agree with Plan of Care  Patient       Patient will benefit from skilled therapeutic intervention in order to improve the following deficits and impairments:  Decreased endurance, Difficulty walking, Hypomobility, Increased muscle spasms, Decreased cognition, Decreased knowledge of precautions, Decreased range of motion, Decreased scar mobility, Impaired perceived functional ability, Improper body mechanics, Pain, Postural dysfunction, Impaired flexibility, Increased fascial restricitons, Decreased strength, Decreased activity tolerance, Decreased coordination  Visit Diagnosis: 1. Pain in thoracic spine   2. Chronic bilateral low back pain without sciatica   3. Muscle weakness (generalized)   4. Abnormal posture        Problem List Patient Active Problem List   Diagnosis Date Noted  . 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 Right hip pain 08/09/2015  . Seizure ([place]) 12/20/2014  . Coagulation disorder ([place]) 12/20/2014  . Dissecting aortic aneurysm, thoracic ([place]) 12/20/2014  . 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 Gout 12/20/2014  . Plantar fasciitis 12/20/2014  . Error, refractive, myopia 12/20/2014  . History of BPH 12/20/2014  . Mitral and aortic regurgitation 12/20/2014  . H/O abdominal aortic aneurysm repair 10/05/2014  . Left flank pain 10/23/2012  . Headache(784.0) 02/03/2012  . 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 Other specified abnormal immunological findings in serum 12/09/2011  . Aortic heart valve narrowing 12/01/2011  . Cardiac murmur 12/01/2011  . Cerebrovascular accident, old 12/01/2011  . Dissection of aorta, thoracic ([place]) 09/23/2011  . Thoracic aneurysm without mention of rupture 09/23/2011  . History  of thoracic aortic aneurysm repair 09/23/2011  . TIA (transient ischemic attack) 09/05/2011  . Descending thoracic aortic aneurysm ([place]) 09/05/2011  . History of mechanical  aortic valve replacement 03/06/2011  . Sinus bradycardia 03/06/2011  . Right bundle branch block 03/06/2011  . Hyperlipidemia 08/11/2008  . Essential hypertension 08/11/2008  . History of open heart surgery 06/20/2008  . Insomnia 03/19/2003  . Ascending aortic dissection ([place]) 02/09/2002  . CAD in native artery 03/18/1997    Everlean Alstrom. Graylon Good, PT, DPT 11/06/18, 1:15 PM  [facility] [address] [place], [address] Phone: [phone]   Fax:  [phone]  Name: AJAX SCHROLL MRN: [id number] Date of Birth: 1945/05/16

## 2018-11-07 ENCOUNTER — Other Ambulatory Visit: Payer: Self-pay | Admitting: Family Medicine

## 2018-11-10 ENCOUNTER — Encounter: Payer: Self-pay | Admitting: Physical Therapy

## 2018-11-10 ENCOUNTER — Ambulatory Visit: Payer: Managed Care, Other (non HMO) | Admitting: Physical Therapy

## 2018-11-10 ENCOUNTER — Other Ambulatory Visit: Payer: Self-pay

## 2018-11-10 DIAGNOSIS — R293 Abnormal posture: Secondary | ICD-10-CM

## 2018-11-10 DIAGNOSIS — M6281 Muscle weakness (generalized): Secondary | ICD-10-CM

## 2018-11-10 DIAGNOSIS — G8929 Other chronic pain: Secondary | ICD-10-CM

## 2018-11-10 DIAGNOSIS — M546 Pain in thoracic spine: Secondary | ICD-10-CM

## 2018-11-10 NOTE — Therapy (Signed)
[facility] [address] [place], [address] Phone: [phone]   Fax:  [phone]  Physical Therapy Treatment  Patient Details  Name: Jason Mcdaniel MRN: [id number] Date of Birth: February 23, 1946 Referring Provider (PT): Birdie Sons, MD   Encounter Date: 11/10/2018  PT End of Session - 11/10/18 0905    Visit Number  3    Number of Visits  12    Date for PT Re-Evaluation  12/16/18    Authorization Type  [employer] reporting period from 11/03/2018    Authorization Time Period  Current Cert period: [id number] - 12/16/2018 (latest PN: IE 11/03/2018)    Authorization - Visit Number  3    Authorization - Number of Visits  10    PT Start Time  0903    PT Stop Time  0942    PT Time Calculation (min)  39 min    Activity Tolerance  Patient tolerated treatment well;No increased pain    Behavior During Therapy  WFL for tasks assessed/performed       Past Medical History:  Diagnosis Date  . AAA (abdominal aortic aneurysm) ([place])   . Ascending aortic dissection ([place]) 02/09/2002   straight graft replacement of ascending thoracic aorta for acute type A aortic dissection (DeBakey type II) with hemiarch distal arch anastamosis and supracoronary proximal anastamosis   . 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 Coronary artery disease   . Descending thoracic aortic aneurysm [facility]) 09/05/2011   08/2011 CT: Interval enlargement of the associated descending thoracic aortic    . Descending thoracic aortic dissection ([place]) 04/25/2006   Acute Type B aortic dissection   . Dysrhythmia   . Epistaxis 09/05/2011   07/2011 - Seen by ENT - resolved.  [name] History of aortic valve replacement    a.  St. Jude mechanical valve via right mini thoracotomy by Dr Gerrit Friends at [facility] ;  b. 02/2011 Echo EF 55-65%, Gr 2 DD, Triv AI, Mild MR.  [name] History of thoracic aortic aneurysm repair 02/09/2002   straight graft replacement of  ascending thoracic aorta for acute type A aortic dissection (DeBakey type II) with hemiarch distal arch anastamosis and supracoronary proximal anastamosis   . HOH (hard of hearing)   . 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 Hypertension   . Myocardial infarction ([place]) 2004  . Peripheral vascular disease ([place])   . Stroke ([place]) 1999  . TIA (transient ischemic attack) 09/05/2011   Attributed to antihypertensive medications    Past Surgical History:  Procedure Laterality Date  . ABDOMINAL AORTIC ANEURYSM REPAIR  12/02/11   [facility]  . CARDIAC CATHETERIZATION    . CARDIAC VALVE REPLACEMENT  2001   aortic valve replaced with St Jude prosthetic valve  . CATARACT EXTRACTION W/PHACO Left 09/26/2015   Procedure: CATARACT EXTRACTION PHACO AND INTRAOCULAR LENS PLACEMENT (IOC);  Surgeon: Birder Robson, MD;  Location: [facility] ORS;  Service: Ophthalmology;  Laterality: Left;  [place] 30.0 AP% 23.4 CDE 7.08 Fluid pack lot # [id number]  . CATARACT EXTRACTION W/PHACO Right 10/10/2015   Procedure: CATARACT EXTRACTION PHACO AND INTRAOCULAR LENS PLACEMENT (IOC);  Surgeon: Birder Robson, MD;  Location: [facility] ORS;  Service: Ophthalmology;  Laterality: Right;  [place] 38.4 AP% 18.7 CDE 7.18 Fluid pack lot # [id number]  . VASCULAR SURGERY    . VOCAL CORD INJECTION  2015    There were no vitals filed for this visit.  Subjective Assessment - 11/10/18  0907    Subjective  Patient reports he is feeling good today. States he is feeling better and he had no soreness or pain following last treatment session. His HEP is going well. He has been doing it 2x a day each day.    Pertinent History  Patient is a 73 y.o. male who presents to outpatient physical therapy with a referral for medical diagnosis of chronic bilateral back pain. This patient's chief complaints consist of gradually worsening left sided mid back pain that is aggravated by heavy lifting activities that started following surgery for abdominal aortic aneurism and dissection in  2013 leading to the following functional deficits: difficulty with heavy lifting such as bringing in groceries, carrying, karate kicks such as the roundhouse, prolonged sitting in unsupported chair, bending such as with gardening, sleeping. Relevant past medical history and comorbidities include history of abdominal aortic aneurism type A with surgical repair (2013), current aneurism of R vertebral artery, PVD, hx of MI (didn't bother him at all), hx TIA and stroke (couldn't walk for 6 months, now fully recovered except for some brain deficits he notices - has trouble with re-call, stroke followed back popping at chiropractor), HTN, horner syndrome (currently not bothering him), heart valve replacement (no troubles with it currently).    Diagnostic tests  see chart    Patient Stated Goals  teach me what to do to help my back.    Currently in Pain?  No/denies    Pain Onset  More than a month ago       TREATMENT: Current R vertebral artery aneurism Hx of vascular issues including AAA repaired with surgery in 2013  Therapeutic exercise:to centralize symptoms and improve ROM, strength, muscular endurance, and activity tolerance required for successful completion of functional activities. - Treadmill 2.0 mph 0% grade. For improved lower extremity mobility, muscular endurance, and weightbearing activity tolerance; and to induce the analgesic effect of aerobic exercise, stimulate improved joint nutrition, and prepare body structures and systems for following interventions. X 5  Minutes during subjective exam.  - standing hip swings adduction/abduction with BUE support, x10 each side.  - prone shoulder CARS/angel 2x10 - Hooklying single leg bridge to strengthen glutes and back muscles. Cuing to keep abdominals tight and continue breathing. 2x10 reps. Cramping in L leg initially. Able to lift about half way up.  - seated lat pull with isolated scapular elevation/depression to improve motor control for  lat activation to translate to lifting and to strengthen lats. X 20 with heavy cuing at 15# to learn activity. X 15 at 25# after getting concept of task.  - hip hinge assessment progressing to hip hinge. x5 reps good carryover for form corrections.   - squat to 18 inch chair tap 2x10 with the following positions: arms across chest x 10, arms in overhead squat position holding PVC stick lats locked down x 10, goblet squat with 20# kettlebell x 10. Winded at end.  - stooped row bracing lumbar spine and trunk position flexed at hips approximately 45 degrees, pulling 8# dumbbells along thigh angle towards hip for a bent over row. 2x10 with breaks.  - Standing cervical thoracic extension/BUE flexion and serratus anterior activation, lat stretch, with rust colored cylindrical bolster up wall, 5 second holds, cuing for technique. x 20 plus time for instruction, transitions, and appropriate rest.  - obstacle course navigation (set up in box shape with different obstical each side - four 6 inch hurdles, one 6 inch step/platform, two 6-inch hurdes, 5 foot  aeromat pad to ambulate tandem stance on): one time through forward without carry, 20# kettle bell held in one hand 2x each side forward and one time each side backwards. With CGA for safety. Patient able to do step over step most hurdles forward, step to most of the time backwards. Patient with improved posture with cuing but struggled to maintain upright posture going backwards due to desire to see feet and obsticles behind him.    HOME EXERCISE PROGRAM Videos of pt performing exercise with audio cuing from clinician on his phone: - prone shoulder CARS/Angel. 3x10 - hooklying single leg bridge, 3x10 each side.     PT Education - 11/10/18 0908    Education Details  Exercise purpose/form. Self management techniques    Person(s) Educated  Patient    Methods  Explanation;Demonstration    Comprehension  Verbalized understanding;Returned demonstration;Verbal  cues required       PT Short Term Goals - 11/04/18 1549      PT SHORT TERM GOAL #1   Title  Be independent with initial home exercise program for self-management of symptoms.    Baseline  to be initiated at session 2    Time  2    Period  Weeks    Status  New    Target Date  11/18/18        PT Long Term Goals - 11/04/18 1549      PT LONG TERM GOAL #1   Title  Be independent with a long-term home exercise program for self-management of symptoms.    Baseline  to be initiated at visit 2 (11/03/2018);    Time  6    Period  Weeks    Status  New    Target Date  12/16/18      PT LONG TERM GOAL #2   Title  Demonstrate improved FOTO score to equal or greater than 87 (30% increase from baseline) to demonstrate improvement in overall condition and self-reported functional ability.    Baseline  FOTO = 67 (11/03/2018);    Time  6    Period  Weeks    Status  New    Target Date  12/16/18      PT LONG TERM GOAL #3   Title  Be able to demonstrate floor to waist lift at least 40# x 10 reps with proper form without limitation due to current condition in order to improve ability to lift during usual activities.    Baseline  35# one rep with rounded back (11/03/2018);    Time  6    Period  Weeks    Status  New    Target Date  12/16/18      PT LONG TERM GOAL #4   Title  Patient will demonstrate deadlift and squat x 10 with 20# weight with proper form to improve ability to improve lifting without pain.    Baseline  rounded back with both techniques (11/03/2018);    Time  6    Period  Weeks    Status  New    Target Date  12/16/18      PT LONG TERM GOAL #5   Title  Complete community, work and/or recreational activities without limitation due to current condition.    Baseline  ifficulty with heavy lifting such as bringing in groceries, carrying, karate kicks such as the roundhouse, prolonged sitting in unsupported chair, bending such as with gardening, sleeping. (11/03/2018);    Time  6     Period  Weeks    Status  New    Target Date  12/16/18            Plan - 11/10/18 1123    Clinical Impression Statement  Patient tolerated treatment well and was able to complete more challenging activities today. He demo good improvement in postural awareness and carry over of cuing from last session. He fatigued with several exercises and required rests to recover due to heavy breathing. Reported no increase in pain during session except mild neck discomfort from extending neck for prolonged period during lat pull that resolved with rest. Pt required multimodal cuing for proper technique and to facilitate improved neuromuscular control, strength, range of motion, and functional ability resulting in improved performance and form. He demonstrated improved hip hinge and trunk posture for squats and lifting by end of session but would benefit from continued work to improve mobility and postural strength and endurance for improved posture and activity tolerance. Patient would benefit from continued physical therapy to address remaining impairments and functional limitations to work towards stated goals and return to PLOF or maximal functional independence.    Personal Factors and Comorbidities  Age;Comorbidity 3+;Past/Current Experience;Time since onset of injury/illness/exacerbation    Comorbidities  history of abdominal aortic aneurism type A with surgical repair (2013), current aneurism of R vertebral artery, PVD, hx of MI (didn't bother him at all), hx TIA and stroke (couldn't walk for 6 months, now fully recovered except for some brain deficits he notices - has trouble with re-call, stroke followed back popping at chiropractor), HTN, horner syndrome (currently not bothering him), heart valve replacement (no troubles with it currently).    Examination-Activity Limitations  Sit;Sleep;Bend;Lift;Squat;Carry;Stand;Other   karate; gardening   Examination-Participation Restrictions   Other;Shop;Interpersonal Relationship   fitness activities, lifting, carrying, gardening, shopping   Stability/Clinical Decision Making  Stable/Uncomplicated    Rehab Potential  Good    PT Frequency  2x / week    PT Duration  6 weeks    PT Treatment/Interventions  ADLs/Self Care Home Management;Cryotherapy;Moist Heat;Functional mobility training;Therapeutic activities;Therapeutic exercise;Neuromuscular re-education;Patient/family education;Manual techniques;Scar mobilization;Passive range of motion;Dry needling;Joint Manipulations;Spinal Manipulations   joint mobilizations grades I-IV   PT Next Visit Plan  work on lifting techniques and functional strength, posture    PT Home Exercise Plan  Videos of pt performing exercise with audio cuing from clinician on his phone: single leg bridges and prone shoulder CARs/angels 3x10 each.    Consulted and Agree with Plan of Care  Patient       Patient will benefit from skilled therapeutic intervention in order to improve the following deficits and impairments:  Decreased endurance, Difficulty walking, Hypomobility, Increased muscle spasms, Decreased cognition, Decreased knowledge of precautions, Decreased range of motion, Decreased scar mobility, Impaired perceived functional ability, Improper body mechanics, Pain, Postural dysfunction, Impaired flexibility, Increased fascial restricitons, Decreased strength, Decreased activity tolerance, Decreased coordination  Visit Diagnosis: Pain in thoracic spine  Chronic bilateral low back pain without sciatica  Muscle weakness (generalized)  Abnormal posture     Problem List Patient Active Problem List   Diagnosis Date Noted  . 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 Right hip pain 08/09/2015  . Seizure ([place]) 12/20/2014  . Coagulation disorder ([place]) 12/20/2014  . Dissecting aortic aneurysm, thoracic ([place]) 12/20/2014  . 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 Gout 12/20/2014   . Plantar fasciitis 12/20/2014  . Error, refractive, myopia 12/20/2014  . History of BPH 12/20/2014  . Mitral and aortic regurgitation  12/20/2014  . H/O abdominal aortic aneurysm repair 10/05/2014  . Left flank pain 10/23/2012  . Headache(784.0) 02/03/2012  . 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 Other specified abnormal immunological findings in serum 12/09/2011  . Aortic heart valve narrowing 12/01/2011  . Cardiac murmur 12/01/2011  . Cerebrovascular accident, old 12/01/2011  . Dissection of aorta, thoracic ([place]) 09/23/2011  . Thoracic aneurysm without mention of rupture 09/23/2011  . History of thoracic aortic aneurysm repair 09/23/2011  . TIA (transient ischemic attack) 09/05/2011  . Descending thoracic aortic aneurysm ([place]) 09/05/2011  . History of mechanical aortic valve replacement 03/06/2011  . Sinus bradycardia 03/06/2011  . Right bundle branch block 03/06/2011  . Hyperlipidemia 08/11/2008  . Essential hypertension 08/11/2008  . History of open heart surgery 06/20/2008  . Insomnia 03/19/2003  . Ascending aortic dissection ([place]) 02/09/2002  . CAD in native artery 03/18/1997    Everlean Alstrom. Graylon Good, PT, DPT 11/10/18, 11:24 AM  [facility] [address], [place], [address] Phone: [phone]   Fax:  [phone]  Name: ZAMAN RINGOLD MRN: [id number] Date of Birth: 1946/02/17

## 2018-11-12 ENCOUNTER — Ambulatory Visit: Payer: Managed Care, Other (non HMO) | Admitting: Physical Therapy

## 2018-11-17 ENCOUNTER — Encounter: Payer: Managed Care, Other (non HMO) | Admitting: Physical Therapy

## 2018-11-19 ENCOUNTER — Encounter: Payer: Managed Care, Other (non HMO) | Admitting: Physical Therapy

## 2018-11-24 ENCOUNTER — Encounter: Payer: Managed Care, Other (non HMO) | Admitting: Physical Therapy

## 2018-11-26 ENCOUNTER — Encounter: Payer: Managed Care, Other (non HMO) | Admitting: Physical Therapy

## 2018-12-01 ENCOUNTER — Encounter: Payer: Managed Care, Other (non HMO) | Admitting: Physical Therapy

## 2018-12-02 ENCOUNTER — Ambulatory Visit (INDEPENDENT_AMBULATORY_CARE_PROVIDER_SITE_OTHER): Payer: Managed Care, Other (non HMO)

## 2018-12-02 ENCOUNTER — Other Ambulatory Visit: Payer: Self-pay

## 2018-12-02 DIAGNOSIS — Z952 Presence of prosthetic heart valve: Secondary | ICD-10-CM

## 2018-12-02 LAB — POCT INR
INR: 3.3 — AB (ref 2.0–3.0)
PT: 39.4

## 2018-12-02 NOTE — Patient Instructions (Signed)
Description   Hold 2 days then 5mg  daily & 10 mg M,W,F

## 2018-12-03 ENCOUNTER — Encounter: Payer: Managed Care, Other (non HMO) | Admitting: Physical Therapy

## 2018-12-08 ENCOUNTER — Encounter: Payer: Managed Care, Other (non HMO) | Admitting: Physical Therapy

## 2018-12-10 ENCOUNTER — Encounter: Payer: Managed Care, Other (non HMO) | Admitting: Physical Therapy

## 2018-12-14 ENCOUNTER — Encounter: Payer: Self-pay | Admitting: Physical Therapy

## 2018-12-14 DIAGNOSIS — M6281 Muscle weakness (generalized): Secondary | ICD-10-CM

## 2018-12-14 DIAGNOSIS — R293 Abnormal posture: Secondary | ICD-10-CM

## 2018-12-14 DIAGNOSIS — G8929 Other chronic pain: Secondary | ICD-10-CM

## 2018-12-14 DIAGNOSIS — M545 Low back pain, unspecified: Secondary | ICD-10-CM

## 2018-12-14 DIAGNOSIS — M546 Pain in thoracic spine: Secondary | ICD-10-CM

## 2018-12-14 NOTE — Therapy (Signed)
[facility] [address], [place], [address] Phone: [phone]   Fax:  [phone]  Physical Therapy No-Visit Discharge Summary Reporting period: 11/03/2018 - 12/14/2018  Patient Details  Name: Jason Mcdaniel MRN: [id number] Date of Birth: 01-22-1946 Referring Provider (PT): Birdie Sons, MD   Encounter Date: 12/14/2018    Past Medical History:  Diagnosis Date  . AAA (abdominal aortic aneurysm) ([place])   . Ascending aortic dissection ([place]) 02/09/2002   straight graft replacement of ascending thoracic aorta for acute type A aortic dissection (DeBakey type II) with hemiarch distal arch anastamosis and supracoronary proximal anastamosis   . 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 Coronary artery disease   . Descending thoracic aortic aneurysm [facility]) 09/05/2011   08/2011 CT: Interval enlargement of the associated descending thoracic aortic    . Descending thoracic aortic dissection ([place]) 04/25/2006   Acute Type B aortic dissection   . Dysrhythmia   . Epistaxis 09/05/2011   07/2011 - Seen by ENT - resolved.  [name] History of aortic valve replacement    a.  St. Jude mechanical valve via right mini thoracotomy by Dr Gerrit Friends at [facility] ;  b. 02/2011 Echo EF 55-65%, Gr 2 DD, Triv AI, Mild MR.  [name] History of thoracic aortic aneurysm repair 02/09/2002   straight graft replacement of ascending thoracic aorta for acute type A aortic dissection (DeBakey type II) with hemiarch distal arch anastamosis and supracoronary proximal anastamosis   . HOH (hard of hearing)   . 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 Hypertension   . Myocardial infarction ([place]) 2004  . Peripheral vascular disease ([place])   . Stroke ([place]) 1999  . TIA (transient ischemic attack) 09/05/2011   Attributed to antihypertensive medications    Past Surgical History:  Procedure Laterality Date  . ABDOMINAL AORTIC ANEURYSM REPAIR  12/02/11   [facility]   . CARDIAC CATHETERIZATION    . CARDIAC VALVE REPLACEMENT  2001   aortic valve replaced with St Jude prosthetic valve  . CATARACT EXTRACTION W/PHACO Left 09/26/2015   Procedure: CATARACT EXTRACTION PHACO AND INTRAOCULAR LENS PLACEMENT (IOC);  Surgeon: Birder Robson, MD;  Location: [facility] ORS;  Service: Ophthalmology;  Laterality: Left;  [place] 30.0 AP% 23.4 CDE 7.08 Fluid pack lot # [id number]  . CATARACT EXTRACTION W/PHACO Right 10/10/2015   Procedure: CATARACT EXTRACTION PHACO AND INTRAOCULAR LENS PLACEMENT (IOC);  Surgeon: Birder Robson, MD;  Location: [facility] ORS;  Service: Ophthalmology;  Laterality: Right;  [place] 38.4 AP% 18.7 CDE 7.18 Fluid pack lot # [id number]  . VASCULAR SURGERY    . VOCAL CORD INJECTION  2015    There were no vitals filed for this visit.  Subjective Assessment - 12/14/18 1151    Subjective  Pateint called and requested to discharge from physical therapy. Spoke with front office staff and did not give reason.    Pertinent History  Patient is a 73 y.o. male who presents to outpatient physical therapy with a referral for medical diagnosis of chronic bilateral back pain. This patient's chief complaints consist of gradually worsening left sided mid back pain that is aggravated by heavy lifting activities that started following surgery for abdominal aortic aneurism and dissection in 2013 leading to the following functional deficits: difficulty with heavy lifting such as bringing in groceries, carrying, karate kicks such as the roundhouse, prolonged sitting in unsupported chair, bending such as with gardening, sleeping. Relevant past medical history and  comorbidities include history of abdominal aortic aneurism type A with surgical repair (2013), current aneurism of R vertebral artery, PVD, hx of MI (didn't bother him at all), hx TIA and stroke (couldn't walk for 6 months, now fully recovered except for some brain deficits he notices - has trouble with re-call, stroke followed back  popping at chiropractor), HTN, horner syndrome (currently not bothering him), heart valve replacement (no troubles with it currently).    Diagnostic tests  see chart    Patient Stated Goals  teach me what to do to help my back.    Pain Onset  More than a month ago       OBJECTIVE Patient not present for examination. Please see prior documentation for latest objective data.     PT Short Term Goals - 12/14/18 1159      PT SHORT TERM GOAL #1   Title  Be independent with initial home exercise program for self-management of symptoms.    Baseline  to be initiated at session 2    Time  2    Period  Weeks    Status  Achieved    Target Date  11/18/18        PT Long Term Goals - 12/14/18 1159      PT LONG TERM GOAL #1   Title  Be independent with a long-term home exercise program for self-management of symptoms.    Baseline  to be initiated at visit 2 (11/03/2018); did not complete POC and did not received final HEP (12/14/2018);    Time  6    Period  Weeks    Status  Partially Met    Target Date  12/16/18      PT LONG TERM GOAL #2   Title  Demonstrate improved FOTO score to equal or greater than 87 (30% increase from baseline) to demonstrate improvement in overall condition and self-reported functional ability.    Baseline  FOTO = 67 (11/03/2018);    Time  6    Period  Weeks    Status  Unable to assess    Target Date  12/16/18      PT LONG TERM GOAL #3   Title  Be able to demonstrate floor to waist lift at least 40# x 10 reps with proper form without limitation due to current condition in order to improve ability to lift during usual activities.    Baseline  35# one rep with rounded back (11/03/2018); practicing lifting in clinic but did not formally re-test (12/15/2018);    Time  6    Period  Weeks    Status  Partially Met    Target Date  12/16/18      PT LONG TERM GOAL #4   Title  Patient will demonstrate deadlift and squat x 10 with 20# weight with proper form to improve  ability to improve lifting without pain.    Baseline  rounded back with both techniques (11/03/2018);    Time  6    Period  Weeks    Status  New      PT LONG TERM GOAL #5   Title  Complete community, work and/or recreational activities without limitation due to current condition.    Baseline  ifficulty with heavy lifting such as bringing in groceries, carrying, karate kicks such as the roundhouse, prolonged sitting in unsupported chair, bending such as with gardening, sleeping. (11/03/2018);    Time  6    Period  Weeks    Status  Unable to assess    Target Date  12/16/18            Plan - 12/14/18 1158    Clinical Impression Statement  Patient attended three physical therapy treatment sessions and was making good progress towards goals per observation at that point. He then called and requested discharge without a reason given so he is being discharged as requested. Patient was unable to continue working towards stated goals after the 3rd visit due to lack of participation.    Personal Factors and Comorbidities  Age;Comorbidity 3+;Past/Current Experience;Time since onset of injury/illness/exacerbation    Comorbidities  history of abdominal aortic aneurism type A with surgical repair (2013), current aneurism of R vertebral artery, PVD, hx of MI (didn't bother him at all), hx TIA and stroke (couldn't walk for 6 months, now fully recovered except for some brain deficits he notices - has trouble with re-call, stroke followed back popping at chiropractor), HTN, horner syndrome (currently not bothering him), heart valve replacement (no troubles with it currently).    Examination-Activity Limitations  Sit;Sleep;Bend;Lift;Squat;Carry;Stand;Other   karate; gardening   Examination-Participation Restrictions  Other;Shop;Interpersonal Relationship   fitness activities, lifting, carrying, gardening, shopping   Stability/Clinical Decision Making  Stable/Uncomplicated    Rehab Potential  Good    PT  Frequency  2x / week    PT Duration  6 weeks    PT Treatment/Interventions  ADLs/Self Care Home Management;Cryotherapy;Moist Heat;Functional mobility training;Therapeutic activities;Therapeutic exercise;Neuromuscular re-education;Patient/family education;Manual techniques;Scar mobilization;Passive range of motion;Dry needling;Joint Manipulations;Spinal Manipulations   joint mobilizations grades I-IV   PT Next Visit Plan  patient is now discharged from physical therapy due to patient request    PT Home Exercise Plan  Videos of pt performing exercise with audio cuing from clinician on his phone: single leg bridges and prone shoulder CARs/angels 3x10 each.    Consulted and Agree with Plan of Care  Patient       Patient will benefit from skilled therapeutic intervention in order to improve the following deficits and impairments:  Decreased endurance, Difficulty walking, Hypomobility, Increased muscle spasms, Decreased cognition, Decreased knowledge of precautions, Decreased range of motion, Decreased scar mobility, Impaired perceived functional ability, Improper body mechanics, Pain, Postural dysfunction, Impaired flexibility, Increased fascial restricitons, Decreased strength, Decreased activity tolerance, Decreased coordination  Visit Diagnosis: Pain in thoracic spine  Chronic bilateral low back pain without sciatica  Muscle weakness (generalized)  Abnormal posture     Problem List Patient Active Problem List   Diagnosis Date Noted  . 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 Right hip pain 08/09/2015  . Seizure ([place]) 12/20/2014  . Coagulation disorder ([place]) 12/20/2014  . Dissecting aortic aneurysm, thoracic ([place]) 12/20/2014  . 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 Gout 12/20/2014  . Plantar fasciitis 12/20/2014  . Error, refractive, myopia 12/20/2014  . History of BPH 12/20/2014  . Mitral and aortic regurgitation 12/20/2014  . H/O abdominal aortic aneurysm  repair 10/05/2014  . Left flank pain 10/23/2012  . Headache(784.0) 02/03/2012  . 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 Other specified abnormal immunological findings in serum 12/09/2011  . Aortic heart valve narrowing 12/01/2011  . Cardiac murmur 12/01/2011  . Cerebrovascular accident, old 12/01/2011  . Dissection of aorta, thoracic ([place]) 09/23/2011  . Thoracic aneurysm without mention of rupture 09/23/2011  . History of thoracic aortic aneurysm repair 09/23/2011  . TIA (transient ischemic attack) 09/05/2011  . Descending thoracic aortic aneurysm ([place]) 09/05/2011  . History of mechanical  aortic valve replacement 03/06/2011  . Sinus bradycardia 03/06/2011  . Right bundle branch block 03/06/2011  . Hyperlipidemia 08/11/2008  . Essential hypertension 08/11/2008  . History of open heart surgery 06/20/2008  . Insomnia 03/19/2003  . Ascending aortic dissection ([place]) 02/09/2002  . CAD in native artery 03/18/1997    Everlean Alstrom. Graylon Good, PT, DPT 12/14/18, 12:02 PM  [facility] [address], [place], [address] Phone: [phone]   Fax:  [phone]  Name: ELIJIO STAPLES MRN: [id number] Date of Birth: 11-15-1945

## 2018-12-15 ENCOUNTER — Encounter: Payer: Managed Care, Other (non HMO) | Admitting: Physical Therapy

## 2018-12-15 ENCOUNTER — Other Ambulatory Visit: Payer: Self-pay | Admitting: Family Medicine

## 2018-12-15 MED ORDER — TADALAFIL 20 MG PO TABS: 10.0000 mg | ORAL_TABLET | ORAL | 5 refills | Status: AC | PRN

## 2018-12-15 NOTE — Progress Notes (Signed)
[facility] for refill Cialis 20mg  by patient request

## 2018-12-23 ENCOUNTER — Other Ambulatory Visit: Payer: Self-pay

## 2018-12-23 ENCOUNTER — Ambulatory Visit (INDEPENDENT_AMBULATORY_CARE_PROVIDER_SITE_OTHER): Payer: Managed Care, Other (non HMO)

## 2018-12-23 DIAGNOSIS — Z23 Encounter for immunization: Secondary | ICD-10-CM

## 2018-12-23 DIAGNOSIS — Z952 Presence of prosthetic heart valve: Secondary | ICD-10-CM

## 2018-12-23 LAB — POCT INR
INR: 1.7 — AB (ref 2.0–3.0)
PT: 20.3

## 2018-12-23 NOTE — Patient Instructions (Signed)
Description   Continue 5mg  daily & 10 mg M,W,F

## 2018-12-25 ENCOUNTER — Other Ambulatory Visit: Payer: Self-pay

## 2018-12-25 DIAGNOSIS — Z20822 Contact with and (suspected) exposure to covid-19: Secondary | ICD-10-CM

## 2018-12-26 LAB — NOVEL CORONAVIRUS, NAA: SARS-CoV-2, NAA: NOT DETECTED

## 2019-01-05 ENCOUNTER — Other Ambulatory Visit: Payer: Self-pay

## 2019-01-05 ENCOUNTER — Ambulatory Visit (INDEPENDENT_AMBULATORY_CARE_PROVIDER_SITE_OTHER): Payer: Managed Care, Other (non HMO)

## 2019-01-05 DIAGNOSIS — Z952 Presence of prosthetic heart valve: Secondary | ICD-10-CM | POA: Diagnosis not present

## 2019-01-05 LAB — POCT INR
INR: 1.8 — AB (ref 2.0–3.0)
PT: 21.4

## 2019-01-05 NOTE — Patient Instructions (Addendum)
Description   Current Dose: 5mg  daily & 10 mg M,W,F Changes: Take 10mg  daily except 5mg  M,W,F Return: 3 weeks

## 2019-01-06 ENCOUNTER — Ambulatory Visit: Payer: Managed Care, Other (non HMO)

## 2019-01-11 ENCOUNTER — Telehealth: Payer: Self-pay | Admitting: Family Medicine

## 2019-01-11 NOTE — Telephone Encounter (Signed)
Patient's wife Vickii Chafe advised directions are 1 tablet q12h.

## 2019-01-11 NOTE — Telephone Encounter (Signed)
Patient and patient's wife Jason Mcdaniel advised on 01/05/2019 warfarin dose was changed to 10mg  daily except 5mg  M,W,F

## 2019-01-11 NOTE — Telephone Encounter (Signed)
Pt needs last instructions on Warfarin  CB#  [phone]  teri

## 2019-01-11 NOTE — Telephone Encounter (Signed)
Pt's wife called wanting to know what is the directions on his Carvedilol 25 mg  CB#  [phone]  teri

## 2019-01-12 ENCOUNTER — Encounter: Payer: Self-pay | Admitting: Family Medicine

## 2019-01-12 ENCOUNTER — Ambulatory Visit: Payer: Managed Care, Other (non HMO) | Admitting: Family Medicine

## 2019-01-12 ENCOUNTER — Ambulatory Visit (INDEPENDENT_AMBULATORY_CARE_PROVIDER_SITE_OTHER): Payer: Managed Care, Other (non HMO) | Admitting: Family Medicine

## 2019-01-12 ENCOUNTER — Other Ambulatory Visit: Payer: Self-pay

## 2019-01-12 VITALS — Temp 99.4°F

## 2019-01-12 DIAGNOSIS — R509 Fever, unspecified: Secondary | ICD-10-CM

## 2019-01-12 DIAGNOSIS — Z20822 Contact with and (suspected) exposure to covid-19: Secondary | ICD-10-CM

## 2019-01-12 NOTE — Progress Notes (Signed)
Patient: Jason Mcdaniel Male    DOB: April 29, 1945   73 y.o.   MRN: [id number] Visit Date: 01/12/2019  Today's Provider: Lelon Huh, MD   Chief Complaint  Patient presents with  . Fever   Subjective:    Virtual Visit via Telephone Note  I connected with Jason Mcdaniel on 01/12/19 at  9:00 AM EDT by telephone and verified that I am speaking with the correct person using two identifiers.  Location: Patient: home  Provider: bfp   I discussed the limitations, risks, security and privacy concerns of performing an evaluation and management service by telephone and the availability of in person appointments. I also discussed with the patient that there may be a patient responsible charge related to this service. The patient expressed understanding and agreed to proceed.    Fever  This is a new problem. The current episode started yesterday. The problem has been gradually improving. The temperature was taken using an oral thermometer. Associated symptoms include headaches and muscle aches (neck pain). Pertinent negatives include no abdominal pain, chest pain, congestion, coughing, ear pain, nausea, sore throat, vomiting or wheezing.  Patient states he feels better this morning after a goon nights sleep. Has had no fever today, but is concerned about coronavirus and requests testing.   Allergies  Allergen Reactions  . Penicillins Hives and Nausea Only    Has patient had a PCN reaction causing immediate rash, facial/tongue/throat swelling, SOB or lightheadedness with hypotension: Hives Has patient had a PCN reaction causing severe rash involving mucus membranes or skin necrosis: No Has patient had a PCN reaction that required hospitalization No Has patient had a PCN reaction occurring within the last 10 years: No If all of the above answers are "NO", then may proceed with Cephalosporin use.     Current Outpatient Medications:  .  amLODipine (NORVASC) 5 MG tablet, TAKE ONE  TABLET TWICE DAILY, Disp: 60 tablet, Rfl: 11 .  aspirin 81 MG tablet, Take 81 mg by mouth daily., Disp: , Rfl:  .  calcium carbonate 200 MG capsule, Take 250 mg by mouth daily. , Disp: , Rfl:  .  carvedilol (COREG) 25 MG tablet, TAKE ONE TABLET BY MOUTH EVERY 12 HOURS (Patient taking differently: 12.5 mg. ), Disp: 60 tablet, Rfl: 12 .  Coenzyme Q10 (COQ10) 100 MG CAPS, Take 1 tablet by mouth daily., Disp: , Rfl:  .  Cyanocobalamin (RA VITAMIN B-12 TR) 1000 MCG TBCR, Take 1,000 mcg by mouth daily., Disp: , Rfl:  .  HYDROcodone-homatropine (HYCODAN) 5-1.5 MG/5ML syrup, Take 5 mLs by mouth every 8 (eight) hours as needed., Disp: 100 mL, Rfl: 0 .  pravastatin (PRAVACHOL) 80 MG tablet, TAKE 1 TABLET BY MOUTH AT BEDTIME, Disp: 30 tablet, Rfl: 11 .  tadalafil (CIALIS) 20 MG tablet, Take 0.5-1 tablets (10-20 mg total) by mouth every other day as needed for erectile dysfunction., Disp: 10 tablet, Rfl: 5 .  temazepam (RESTORIL) 15 MG capsule, TAKE 1 CAPSULE BY MOUTH AT BEDTIME AS NEEDED FOR SLEEP, Disp: 30 capsule, Rfl: 5 .  warfarin (COUMADIN) 10 MG tablet, TAKE 1 TO 2 TABLETS AS DIRECTED, Disp: 60 tablet, Rfl: 5 .  warfarin (COUMADIN) 10 MG tablet, TAKE 1 TO 2 TABLETS AS DIRECTED, Disp: 60 tablet, Rfl: 5 .  warfarin (COUMADIN) 10 MG tablet, TAKE 1 TO 2 TABLETS BY MOUTH AS DIRECTED, Disp: 60 tablet, Rfl: 5  Review of Systems  Constitutional: Positive for fatigue and fever (up to  101.2). Negative for appetite change, chills and diaphoresis.  HENT: Negative for congestion, ear pain and sore throat.   Respiratory: Negative for cough, chest tightness, shortness of breath and wheezing.   Cardiovascular: Negative for chest pain and palpitations.  Gastrointestinal: Negative for abdominal pain, nausea and vomiting.  Musculoskeletal: Positive for neck pain.  Neurological: Positive for dizziness, light-headedness and headaches.    Social History   Tobacco Use  . Smoking status: Never Smoker  . Smokeless  tobacco: Never Used  Substance Use Topics  . Alcohol use: Yes    Alcohol/week: 0.0 - 4.0 standard drinks      Objective:   Temp 99.4 F (37.4 C) (Oral)  Vitals:   01/12/19 0845  Temp: 99.4 F (37.4 C)  TempSrc: Oral  There is no height or weight on file to calculate BMI.   Physical Exam  Awake, alert, oriented x 3. In no respiratory distress.      Assessment & Plan    1. Fever, unspecified fever cause Patient given instruction and order placed for Covid testing.    I discussed the assessment and treatment plan with the patient. The patient was provided an opportunity to ask questions and all were answered. The patient agreed with the plan and demonstrated an understanding of the instructions.   The patient was advised to call back or seek an in-person evaluation if the symptoms worsen or if the condition fails to improve as anticipated.  I provided 5 minutes of non-face-to-face time during this encounter.        Lelon Huh, MD  [facility]

## 2019-01-13 LAB — NOVEL CORONAVIRUS, NAA: SARS-CoV-2, NAA: NOT DETECTED

## 2019-01-14 ENCOUNTER — Telehealth: Payer: Self-pay

## 2019-01-14 NOTE — Telephone Encounter (Signed)
Patient advised as directed below. 

## 2019-01-14 NOTE — Telephone Encounter (Signed)
-----   Message from Birdie Sons, MD sent at 01/14/2019  7:05 AM EDT ----- covid test is negative. However test is not 100% sensitive. If he develops any upper respiratory symptoms or any more fever he should self quarantine.

## 2019-01-17 NOTE — Patient Instructions (Signed)
.   Please review the attached list of medications and notify my office if there are any errors.   . Please bring all of your medications to every appointment so we can make sure that our medication list is the same as yours.   . It is especially important to get the annual flu vaccine this year. If you haven't had it already, please go to your pharmacy or call the office as soon as possible to schedule you flu shot.  

## 2019-01-26 ENCOUNTER — Other Ambulatory Visit: Payer: Self-pay

## 2019-01-26 ENCOUNTER — Ambulatory Visit (INDEPENDENT_AMBULATORY_CARE_PROVIDER_SITE_OTHER): Payer: Managed Care, Other (non HMO)

## 2019-01-26 DIAGNOSIS — Z952 Presence of prosthetic heart valve: Secondary | ICD-10-CM | POA: Diagnosis not present

## 2019-01-26 LAB — POCT INR
INR: 4.7 — AB (ref 2.0–3.0)
Prothrombin Time: 56

## 2019-01-26 NOTE — Patient Instructions (Signed)
Description   Current Dose: 5mg  daily & 10 mg M,W,F Changes:Hold x2 days and then resume 10mg  Monday and Friday and all other days 5mg   Return: 3 weeks

## 2019-02-11 ENCOUNTER — Emergency Department (HOSPITAL_COMMUNITY): Payer: Managed Care, Other (non HMO)

## 2019-02-11 ENCOUNTER — Inpatient Hospital Stay (HOSPITAL_COMMUNITY)
Admission: EM | Admit: 2019-02-11 | Discharge: 2019-02-16 | DRG: 064 | Disposition: E | Payer: Managed Care, Other (non HMO) | Attending: Neurology | Admitting: Neurology

## 2019-02-11 DIAGNOSIS — R404 Transient alteration of awareness: Secondary | ICD-10-CM | POA: Diagnosis not present

## 2019-02-11 DIAGNOSIS — G936 Cerebral edema: Secondary | ICD-10-CM

## 2019-02-11 DIAGNOSIS — Z515 Encounter for palliative care: Secondary | ICD-10-CM | POA: Diagnosis present

## 2019-02-11 DIAGNOSIS — Z4682 Encounter for fitting and adjustment of non-vascular catheter: Secondary | ICD-10-CM | POA: Diagnosis not present

## 2019-02-11 DIAGNOSIS — I10 Essential (primary) hypertension: Secondary | ICD-10-CM | POA: Diagnosis present

## 2019-02-11 DIAGNOSIS — R739 Hyperglycemia, unspecified: Secondary | ICD-10-CM | POA: Diagnosis present

## 2019-02-11 DIAGNOSIS — I252 Old myocardial infarction: Secondary | ICD-10-CM

## 2019-02-11 DIAGNOSIS — H53462 Homonymous bilateral field defects, left side: Secondary | ICD-10-CM | POA: Diagnosis present

## 2019-02-11 DIAGNOSIS — I611 Nontraumatic intracerebral hemorrhage in hemisphere, cortical: Principal | ICD-10-CM | POA: Diagnosis present

## 2019-02-11 DIAGNOSIS — Z952 Presence of prosthetic heart valve: Secondary | ICD-10-CM

## 2019-02-11 DIAGNOSIS — G902 Horner's syndrome: Secondary | ICD-10-CM | POA: Diagnosis present

## 2019-02-11 DIAGNOSIS — G935 Compression of brain: Secondary | ICD-10-CM

## 2019-02-11 DIAGNOSIS — J9601 Acute respiratory failure with hypoxia: Secondary | ICD-10-CM | POA: Diagnosis not present

## 2019-02-11 DIAGNOSIS — H9190 Unspecified hearing loss, unspecified ear: Secondary | ICD-10-CM | POA: Diagnosis present

## 2019-02-11 DIAGNOSIS — R4020 Unspecified coma: Secondary | ICD-10-CM | POA: Diagnosis present

## 2019-02-11 DIAGNOSIS — Z79899 Other long term (current) drug therapy: Secondary | ICD-10-CM

## 2019-02-11 DIAGNOSIS — R7401 Elevation of levels of liver transaminase levels: Secondary | ICD-10-CM | POA: Diagnosis present

## 2019-02-11 DIAGNOSIS — Z880 Allergy status to penicillin: Secondary | ICD-10-CM | POA: Diagnosis not present

## 2019-02-11 DIAGNOSIS — G8194 Hemiplegia, unspecified affecting left nondominant side: Secondary | ICD-10-CM | POA: Diagnosis present

## 2019-02-11 DIAGNOSIS — Z20828 Contact with and (suspected) exposure to other viral communicable diseases: Secondary | ICD-10-CM | POA: Diagnosis present

## 2019-02-11 DIAGNOSIS — R519 Headache, unspecified: Secondary | ICD-10-CM | POA: Diagnosis present

## 2019-02-11 DIAGNOSIS — E785 Hyperlipidemia, unspecified: Secondary | ICD-10-CM | POA: Diagnosis present

## 2019-02-11 DIAGNOSIS — E669 Obesity, unspecified: Secondary | ICD-10-CM | POA: Diagnosis present

## 2019-02-11 DIAGNOSIS — I2510 Atherosclerotic heart disease of native coronary artery without angina pectoris: Secondary | ICD-10-CM | POA: Diagnosis present

## 2019-02-11 DIAGNOSIS — I612 Nontraumatic intracerebral hemorrhage in hemisphere, unspecified: Secondary | ICD-10-CM | POA: Diagnosis not present

## 2019-02-11 DIAGNOSIS — I629 Nontraumatic intracranial hemorrhage, unspecified: Secondary | ICD-10-CM | POA: Diagnosis not present

## 2019-02-11 DIAGNOSIS — H5704 Mydriasis: Secondary | ICD-10-CM | POA: Diagnosis present

## 2019-02-11 DIAGNOSIS — Z7901 Long term (current) use of anticoagulants: Secondary | ICD-10-CM

## 2019-02-11 DIAGNOSIS — I619 Nontraumatic intracerebral hemorrhage, unspecified: Secondary | ICD-10-CM

## 2019-02-11 DIAGNOSIS — Z6829 Body mass index (BMI) 29.0-29.9, adult: Secondary | ICD-10-CM

## 2019-02-11 DIAGNOSIS — R29810 Facial weakness: Secondary | ICD-10-CM | POA: Diagnosis not present

## 2019-02-11 DIAGNOSIS — G4489 Other headache syndrome: Secondary | ICD-10-CM | POA: Diagnosis not present

## 2019-02-11 DIAGNOSIS — Z9289 Personal history of other medical treatment: Secondary | ICD-10-CM

## 2019-02-11 DIAGNOSIS — I615 Nontraumatic intracerebral hemorrhage, intraventricular: Secondary | ICD-10-CM | POA: Diagnosis present

## 2019-02-11 DIAGNOSIS — R0689 Other abnormalities of breathing: Secondary | ICD-10-CM | POA: Diagnosis not present

## 2019-02-11 DIAGNOSIS — I70209 Unspecified atherosclerosis of native arteries of extremities, unspecified extremity: Secondary | ICD-10-CM | POA: Diagnosis present

## 2019-02-11 DIAGNOSIS — R0902 Hypoxemia: Secondary | ICD-10-CM | POA: Diagnosis not present

## 2019-02-11 DIAGNOSIS — R791 Abnormal coagulation profile: Secondary | ICD-10-CM | POA: Diagnosis present

## 2019-02-11 DIAGNOSIS — Z8249 Family history of ischemic heart disease and other diseases of the circulatory system: Secondary | ICD-10-CM

## 2019-02-11 DIAGNOSIS — Z8679 Personal history of other diseases of the circulatory system: Secondary | ICD-10-CM

## 2019-02-11 DIAGNOSIS — Z66 Do not resuscitate: Secondary | ICD-10-CM | POA: Diagnosis present

## 2019-02-11 DIAGNOSIS — Z7982 Long term (current) use of aspirin: Secondary | ICD-10-CM

## 2019-02-11 DIAGNOSIS — Z8673 Personal history of transient ischemic attack (TIA), and cerebral infarction without residual deficits: Secondary | ICD-10-CM

## 2019-02-11 LAB — DIFFERENTIAL
Abs Immature Granulocytes: 0.04 10*3/uL (ref 0.00–0.07)
Basophils Absolute: 0 10*3/uL (ref 0.0–0.1)
Basophils Relative: 0 %
Eosinophils Absolute: 0.1 10*3/uL (ref 0.0–0.5)
Eosinophils Relative: 1 %
Immature Granulocytes: 0 %
Lymphocytes Relative: 8 %
Lymphs Abs: 0.8 10*3/uL (ref 0.7–4.0)
Monocytes Absolute: 0.6 10*3/uL (ref 0.1–1.0)
Monocytes Relative: 5 %
Neutro Abs: 9.3 10*3/uL — ABNORMAL HIGH (ref 1.7–7.7)
Neutrophils Relative %: 86 %

## 2019-02-11 LAB — COMPREHENSIVE METABOLIC PANEL
ALT: 55 U/L — ABNORMAL HIGH (ref 0–44)
AST: 65 U/L — ABNORMAL HIGH (ref 15–41)
Albumin: 3.8 g/dL (ref 3.5–5.0)
Alkaline Phosphatase: 87 U/L (ref 38–126)
Anion gap: 13 (ref 5–15)
BUN: 23 mg/dL (ref 8–23)
CO2: 21 mmol/L — ABNORMAL LOW (ref 22–32)
Calcium: 9.2 mg/dL (ref 8.9–10.3)
Chloride: 103 mmol/L (ref 98–111)
Creatinine, Ser: 0.94 mg/dL (ref 0.61–1.24)
GFR calc Af Amer: >60 mL/min (ref >60)
GFR calc non Af Amer: >60 mL/min (ref >60)
Glucose, Bld: 145 mg/dL — ABNORMAL HIGH (ref 70–99)
Potassium: 3.9 mmol/L (ref 3.5–5.1)
Sodium: 137 mmol/L (ref 135–145)
Total Bilirubin: 1 mg/dL (ref 0.3–1.2)
Total Protein: 6.6 g/dL (ref 6.5–8.1)

## 2019-02-11 LAB — I-STAT CHEM 8, ED
BUN: 24 mg/dL — ABNORMAL HIGH (ref 8–23)
Calcium, Ion: 1.15 mmol/L (ref 1.15–1.40)
Chloride: 103 mmol/L (ref 98–111)
Creatinine, Ser: 0.9 mg/dL (ref 0.61–1.24)
Glucose, Bld: 139 mg/dL — ABNORMAL HIGH (ref 70–99)
HCT: 36 % — ABNORMAL LOW (ref 39.0–52.0)
Hemoglobin: 12.2 g/dL — ABNORMAL LOW (ref 13.0–17.0)
Potassium: 3.9 mmol/L (ref 3.5–5.1)
Sodium: 136 mmol/L (ref 135–145)
TCO2: 24 mmol/L (ref 22–32)

## 2019-02-11 LAB — ETHANOL: Alcohol, Ethyl (B): <10 mg/dL (ref <10)

## 2019-02-11 LAB — CBG MONITORING, ED: Glucose-Capillary: 139 mg/dL — ABNORMAL HIGH (ref 70–99)

## 2019-02-11 LAB — PROTIME-INR
INR: 3 — ABNORMAL HIGH (ref 0.8–1.2)
Prothrombin Time: 30.7 seconds — ABNORMAL HIGH (ref 11.4–15.2)

## 2019-02-11 LAB — CBC
HCT: 36.2 % — ABNORMAL LOW (ref 39.0–52.0)
Hemoglobin: 11.9 g/dL — ABNORMAL LOW (ref 13.0–17.0)
MCH: 29.7 pg (ref 26.0–34.0)
MCHC: 32.9 g/dL (ref 30.0–36.0)
MCV: 90.3 fL (ref 80.0–100.0)
Platelets: 194 10*3/uL (ref 150–400)
RBC: 4.01 MIL/uL — ABNORMAL LOW (ref 4.22–5.81)
RDW: 13.1 % (ref 11.5–15.5)
WBC: 10.8 10*3/uL — ABNORMAL HIGH (ref 4.0–10.5)
nRBC: 0 % (ref 0.0–0.2)

## 2019-02-11 LAB — APTT: aPTT: 48 seconds — ABNORMAL HIGH (ref 24–36)

## 2019-02-11 MED ORDER — PROTHROMBIN COMPLEX CONC HUMAN 500 UNITS IV KIT
1652.0000 [IU] | PACK | Status: AC
  Administered 2019-02-11: 1652 [IU] via INTRAVENOUS
  Filled 2019-02-11: qty 500

## 2019-02-11 MED ORDER — ONDANSETRON HCL 4 MG/2ML IJ SOLN
INTRAMUSCULAR | Status: AC
  Administered 2019-02-11: 4 mg
  Filled 2019-02-11: qty 2

## 2019-02-11 MED ORDER — ACETAMINOPHEN 325 MG PO TABS: 650.0000 mg | ORAL_TABLET | ORAL | Status: DC | PRN

## 2019-02-11 MED ORDER — ACETAMINOPHEN 650 MG RE SUPP: 650.0000 mg | RECTAL | Status: DC | PRN

## 2019-02-11 MED ORDER — CLEVIDIPINE BUTYRATE 0.5 MG/ML IV EMUL
INTRAVENOUS | Status: AC
  Filled 2019-02-11: qty 50

## 2019-02-11 MED ORDER — SENNOSIDES-DOCUSATE SODIUM 8.6-50 MG PO TABS: 1.0000 | ORAL_TABLET | Freq: Two times a day (BID) | ORAL | Status: DC

## 2019-02-11 MED ORDER — VITAMIN K1 10 MG/ML IJ SOLN
10.0000 mg | INTRAVENOUS | Status: AC
  Administered 2019-02-12: 10 mg via INTRAVENOUS
  Filled 2019-02-11: qty 1

## 2019-02-11 MED ORDER — ETOMIDATE 2 MG/ML IV SOLN
INTRAVENOUS | Status: AC | PRN
  Administered 2019-02-11: 10 mg via INTRAVENOUS

## 2019-02-11 MED ORDER — STROKE: EARLY STAGES OF RECOVERY BOOK: Freq: Once | Status: DC

## 2019-02-11 MED ORDER — LABETALOL HCL 5 MG/ML IV SOLN
INTRAVENOUS | Status: AC
  Administered 2019-02-11: 40 mg
  Filled 2019-02-11: qty 4

## 2019-02-11 MED ORDER — ETOMIDATE 2 MG/ML IV SOLN
INTRAVENOUS | Status: AC
  Filled 2019-02-11: qty 30

## 2019-02-11 MED ORDER — SUCCINYLCHOLINE CHLORIDE 20 MG/ML IJ SOLN
INTRAMUSCULAR | Status: AC | PRN
  Administered 2019-02-11: 100 mg via INTRAVENOUS

## 2019-02-11 MED ORDER — PROPOFOL 1000 MG/100ML IV EMUL
INTRAVENOUS | Status: AC
  Administered 2019-02-11: 10 ug
  Administered 2019-02-11 (×2): 20 ug
  Filled 2019-02-11: qty 100

## 2019-02-11 MED ORDER — ACETAMINOPHEN 160 MG/5ML PO SOLN: 650.0000 mg | ORAL | Status: DC | PRN

## 2019-02-11 MED ORDER — CLEVIDIPINE BUTYRATE 0.5 MG/ML IV EMUL
INTRAVENOUS | Status: AC
  Administered 2019-02-11: 1 mg/h
  Filled 2019-02-11: qty 50

## 2019-02-11 MED ORDER — PANTOPRAZOLE SODIUM 40 MG IV SOLR
40.0000 mg | Freq: Every day | INTRAVENOUS | Status: DC
  Administered 2019-02-12: 40 mg via INTRAVENOUS
  Filled 2019-02-11: qty 40

## 2019-02-11 NOTE — H&P (Addendum)
Chief Complaint: Worst headache of his life  History obtained from: Patient and Chart   HPI:                                                                                                                                       Jason Mcdaniel is a 73 y.o. 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 EMS called and patient unresponsive with left side weakness. BP 99991111 systolic.   On arrival, patient follows commands with right side. Left UE plegic and left leg moves but not against gravity. Forced gaze deviation to the right and dilated right pupil. Vomiting at scene. Stat CT head showed large left frontal bleed with 18 mm midline shift. Labetalol and clevepirix started immediately for BP control and patient intubated for airway. Kcentra given for reversal.  INR was 3.0 NS consulted for hematoma evacuation given significant midline shift.   According to the daughter, patient was complained of mild headache earlier in the evening.  Had taken an Aleve.  Later after dinner between 8:30 PM and 9 PM patient developed sudden onset severe headache and then she witnessed him have significant slurred speech, become slumped on the sofa and stopped using his left side.  Called 911 immediately.  ICH score 2  (1 for GCS and 1 for size of hematoma)  Past Medical History:  Diagnosis Date  . AAA (abdominal aortic aneurysm) ([place])   . Ascending aortic dissection ([place]) 02/09/2002   straight graft replacement of ascending thoracic aorta for acute type A aortic dissection (DeBakey type II) with hemiarch distal arch anastamosis and supracoronary proximal anastamosis   . 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 Coronary artery disease   . 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 Descending  thoracic aortic dissection ([place]) 04/25/2006   Acute Type B aortic dissection   . Dysrhythmia   . Epistaxis 09/05/2011   07/2011 - Seen by ENT - resolved.  [name] History of aortic valve replacement    a.  St. Jude mechanical valve via right mini thoracotomy by Dr Gerrit Friends at [facility] ;  b. 02/2011 Echo EF 55-65%, Gr 2 DD, Triv AI, Mild MR.  [name] History of thoracic aortic aneurysm repair 02/09/2002   straight graft replacement of ascending thoracic aorta for acute type A aortic dissection (DeBakey type II) with hemiarch distal arch anastamosis and supracoronary proximal anastamosis   . HOH (hard of hearing)   . 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 Hypertension   . Myocardial infarction ([place]) 2004  . Peripheral vascular disease ([place])   . Stroke ([place]) 1999  . TIA (transient ischemic attack) 09/05/2011   Attributed to antihypertensive medications  Past Surgical History:  Procedure Laterality Date  . ABDOMINAL AORTIC ANEURYSM REPAIR  12/02/11   [facility]  . CARDIAC CATHETERIZATION    . CARDIAC VALVE REPLACEMENT  2001   aortic valve replaced with St Jude prosthetic valve  . CATARACT EXTRACTION W/PHACO Left 09/26/2015   Procedure: CATARACT EXTRACTION PHACO AND INTRAOCULAR LENS PLACEMENT (IOC);  Surgeon: Birder Robson, MD;  Location: [facility] ORS;  Service: Ophthalmology;  Laterality: Left;  [place] 30.0 AP% 23.4 CDE 7.08 Fluid pack lot # [id number]  . CATARACT EXTRACTION W/PHACO Right 10/10/2015   Procedure: CATARACT EXTRACTION PHACO AND INTRAOCULAR LENS PLACEMENT (IOC);  Surgeon: Birder Robson, MD;  Location: [facility] ORS;  Service: Ophthalmology;  Laterality: Right;  [place] 38.4 AP% 18.7 CDE 7.18 Fluid pack lot # [id number]  . VASCULAR SURGERY    . VOCAL CORD INJECTION  2015    Family History  Problem Relation Age of Onset  . Goiter Mother   . Heart attack Father   . Hypertension Father   . Hypertension Sister   . Heart attack Sister   . Heart attack Brother   . Heart attack Brother   . Suicidality Son     Social History:  reports that he has never smoked. He has never used smokeless tobacco. He reports current alcohol use. He reports that he does not use drugs.  Allergies:  Allergies  Allergen Reactions  . Penicillins Hives and Nausea Only    Has patient had a PCN reaction causing immediate rash, facial/tongue/throat swelling, SOB or lightheadedness with hypotension: Hives Has patient had a PCN reaction causing severe rash involving mucus membranes or skin necrosis: No Has patient had a PCN reaction that required hospitalization No Has patient had a PCN reaction occurring within the last 10 years: No If all of the above answers are "NO", then may proceed with Cephalosporin use.    Medications:                                                                                                                        I reviewed home medications.   ROS:                                                                                                       Unable to review systems due to mental status   Examination:  General: Appears well-developed  Psych: Affect appropriate to situation Eyes: No scleral injection HENT: No OP obstrucion Head: Normocephalic.  Cardiovascular: Normal rate and regular rhythm.  Respiratory: Effort normal and breath sounds normal to anterior ascultation GI: Soft.  No distension. There is no tenderness.  Skin: WDI    Neurological Examination Mental Status: Patient is lethargic, but arousable.  He is able to state his name but does not answer questions such as what month it is.  Follow simple commands such as giving me a thumbs up and squeezing my hand with his right hand Cranial Nerves: II: Visual fields : Left homonymous hemianopsia,  III,IV, VI: ptosis not present, right forced gaze deviation, right eye pupil 8 mm sluggish, left eye 4 mm reactive V,VII: Left  facial droop, unable to assess facial sensation  due to mental status VIII: Unable to assess hearing due to mental status IX,X: Gag reflex present XI: Unable to assess XII: midline tongue extension Motor: Right : Upper extremity   5/5    Left:     Upper extremity   0/5  Lower extremity   2/5     Lower extremity   5/5 Tone and bulk:normal tone throughout; no atrophy noted Sensory: Difficult to assess due to mental status Right: downgoing   Left: downgoing Cerebellar: No gross ataxia noted on the right side Gait: Unable to assess     Lab Results: Basic Metabolic Panel: Recent Labs  Lab 01/25/2019 2303  NA 136  K 3.9  CL 103  GLUCOSE 139*  BUN 24*  CREATININE 0.90    CBC: Recent Labs  Lab 01/17/2019 2303  HGB 12.2*  HCT 36.0*    Coagulation Studies: No results for input(s): LABPROT, INR in the last 72 hours.  Imaging: Ct Head Code Stroke Wo Contrast  Result Date: 02/10/2019 CLINICAL DATA:  Code stroke.  Left-sided weakness. Unresponsive. EXAM: CT HEAD WITHOUT CONTRAST TECHNIQUE: Contiguous axial images were obtained from the base of the skull through the vertex without intravenous contrast. COMPARISON:  10/13/2018 FINDINGS: Brain: There is massive intraparenchymal hemorrhage centered in the right frontal lobe, measuring 10.4 x 6.1 x 4.8 cm (volume = 160 cm^3). There is leftward midline shift measuring 18 mm. There is subfalcine herniation of the right cingulate gyrus. There is mass effect on the right lateral ventricle. There is intraventricular extension of hemorrhage into the lateral and fourth ventricles. There is a small amount of subarachnoid blood over the right convexity. Vascular: No abnormal hyperdensity of the major intracranial arteries or dural venous sinuses. No intracranial atherosclerosis. Skull: The visualized skull base, calvarium and extracranial soft tissues are normal. Sinuses/Orbits: Fluid level in the right maxillary sinus. The orbits are normal. IMPRESSION:  Massive intraparenchymal hemorrhage centered in the right frontal lobe, measuring 160 mL, with leftward midline shift measuring 18 mm and subfalcine herniation of the right cingulate gyrus. Critical Value/emergent results were called by telephone at the time of interpretation on 01/28/2019 at 10:53 pm to Dr. Karena Addison Clearence Vitug, who verbally acknowledged these results. Electronically Signed   By: Ulyses Jarred M.D.   On: 01/28/2019 22:53     ASSESSMENT AND PLAN  73 y.o. male with h/o Aortic dissection, CAD, mechanical valve on warfarin presents with sudden onset lethargy and left-sided weakness 2/2 large right frontal hemorrhage with 18 mm midline shift.  Blood pressure immediately controlled using labetalol and Cleviprex and patient intubated for airway protection.  Kcentra administered for warfarin reversal, INR came back as 3.  Detailed conversation was had  with family regarding whether we should proceed with neurosurgical intervention for hematoma evacuation.  Family stated that he was very active and would not want aggressive intervention if he was going to be left disabled and this would not be consistent with quality of life for him.  Neurosurgery was consulted and appreciate assessment, will pursue conservative management with 3% hypertonic saline.  We will repeat CT head in 6 hours and consider palliative care consult in the morning if patient continues to decline.   Large right frontal intracerebral hemorrhage in the setting of anticoagulation with significant cerebral edema and midline shift  ICH score 2 Patient's INR was 3, reversed with Kcentra BP goal less than [id number] systolic, on Cleviprex drip Stat neurosurgery consult: Decision was made for conservative management We will start hypertonic saline 3% Repeat CT head in 6 hours  Acute respiratory failure secondary to above Appreciate ENT assistance for intubation PCCM assistance for ventilator management  Mechanical aortic valve -Coumadin  held due to [place]  Hyperglycemia -Treat as needed   CODE STATUS: DNR  This patient is neurologically critically ill due to ICH/  He is at risk for significant risk of neurological worsening from cerebral edema,  death from brain herniation, heart failure, infection, respiratory failure and seizure. This patient's care requires constant monitoring of vital signs, hemodynamics, respiratory and cardiac monitoring, review of multiple databases, neurological assessment, discussion with family, other specialists and medical decision making of high complexity.  I spent 70  minutes of neurocritical time in the care of this patient.     Haroun Cotham [facility] Pager Number [phone]

## 2019-02-11 NOTE — ED Notes (Signed)
Pt still moving on own. Pt was bolused 2x 20 mcg each time per lisa pa.

## 2019-02-11 NOTE — ED Provider Notes (Signed)
[facility] EMERGENCY DEPARTMENT Provider Note   CSN: [id number] Arrival date & time: 01/21/2019  2235     History   Chief Complaint No chief complaint on file.   HPI Jason Mcdaniel is a 73 y.o. male.     The history is provided by the patient and medical records.     LEVEL V CAVEAT:  ACUITY OF CONDITION  73 y.o. M with hx of AAA, CAD, mechanical heart valve on coumadin, presenting to the ED with worst headache of his left while having dinner with family at 8:30PM.  Slowly became less responsive with left sided weakness.  On arrival, patient minimally responsive, following limited commands, vomiting. Immediately taken to CT scan.  Other details of history are not known at this time.  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   History of aortic valve replacement    a.  St. Jude mechanical valve via right mini thoracotomy by Dr Gerrit Friends at [facility] ;  b. 02/2011 Echo EF 55-65%, Gr 2 DD, Triv AI, Mild MR.   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   Right hip pain 08/09/2015   Seizure ([place]) 12/20/2014   Coagulation disorder ([place]) 12/20/2014   Dissecting aortic aneurysm, thoracic ([place]) 12/20/2014   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/2014   Gout 12/20/2014   Plantar fasciitis 12/20/2014   Error, refractive, myopia 12/20/2014   History of BPH 12/20/2014   Mitral and aortic regurgitation 12/20/2014   H/O abdominal aortic aneurysm repair 10/05/2014   Left flank pain 10/23/2012   Headache(784.0) 02/03/2012   False positive serological test for syphilis 12/09/2011   Other specified abnormal immunological findings in serum 12/09/2011   Aortic heart valve narrowing 12/01/2011   Cardiac murmur 12/01/2011   Cerebrovascular accident, old 12/01/2011   Dissection of aorta, thoracic ([place]) 09/23/2011   Thoracic aneurysm without mention of rupture 09/23/2011   History of thoracic aortic aneurysm repair 09/23/2011   TIA (transient ischemic attack) 09/05/2011   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   History of mechanical aortic valve replacement 03/06/2011   Sinus bradycardia 03/06/2011   Right bundle branch block 03/06/2011   Hyperlipidemia 08/11/2008   Essential hypertension 08/11/2008   History of open heart surgery  06/20/2008   Insomnia 03/19/2003   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   CAD in native artery 03/18/1997    Past Surgical History:  Procedure Laterality Date   ABDOMINAL AORTIC ANEURYSM REPAIR  12/02/11   [facility]   CARDIAC CATHETERIZATION     CARDIAC VALVE REPLACEMENT  2001   aortic valve replaced with St Jude prosthetic valve   CATARACT  EXTRACTION W/PHACO Left 09/26/2015   Procedure: CATARACT EXTRACTION PHACO AND INTRAOCULAR LENS PLACEMENT ([place]);  Surgeon: Birder Robson, MD;  Location: [facility] ORS;  Service: Ophthalmology;  Laterality: Left;  [place] 30.0 AP% 23.4 CDE 7.08 Fluid pack lot # [id number]   CATARACT EXTRACTION W/PHACO Right 10/10/2015   Procedure: CATARACT EXTRACTION PHACO AND INTRAOCULAR LENS PLACEMENT ([facility]);  Surgeon: Birder Robson, MD;  Location: [facility] ORS;  Service: Ophthalmology;  Laterality: Right;  [place] 38.4 AP% 18.7 CDE 7.18 Fluid pack lot # [id number]   VASCULAR SURGERY     VOCAL CORD INJECTION  2015        Home Medications    Prior to Admission medications   Medication Sig Start Date End Date Taking? Authorizing Provider  amLODipine (NORVASC) 5 MG tablet TAKE ONE TABLET TWICE DAILY 11/08/18   Birdie Sons, MD  aspirin 81 MG tablet Take 81 mg by mouth daily.    [provider]  calcium carbonate 200 MG capsule Take 250 mg by mouth daily.     [provider]  carvedilol (COREG) 25 MG tablet TAKE ONE TABLET BY MOUTH EVERY 12 HOURS Patient taking differently: 12.5 mg.  05/14/18   Birdie Sons, MD  Coenzyme Q10 (COQ10) 100 MG CAPS Take 1 tablet by mouth daily.    [provider]  Cyanocobalamin (RA VITAMIN B-12 TR) 1000 MCG TBCR Take 1,000 mcg by mouth daily.    [provider]  HYDROcodone-homatropine (HYCODAN) 5-1.5 MG/5ML syrup Take 5 mLs by mouth every 8 (eight) hours as needed. 03/20/18   Birdie Sons, MD  pravastatin (PRAVACHOL) 80 MG tablet TAKE 1 TABLET BY MOUTH AT BEDTIME 10/06/18   Birdie Sons, MD  tadalafil (CIALIS) 20 MG tablet Take 0.5-1 tablets (10-20 mg total) by mouth every other day as needed for erectile dysfunction. 12/15/18   Birdie Sons, MD  temazepam (RESTORIL) 15 MG capsule TAKE 1 CAPSULE BY MOUTH AT BEDTIME AS NEEDED FOR SLEEP 10/20/18   Birdie Sons, MD  warfarin (COUMADIN) 10 MG tablet TAKE 1 TO 2 TABLETS AS DIRECTED 11/11/16    Birdie Sons, MD  warfarin (COUMADIN) 10 MG tablet TAKE 1 TO 2 TABLETS AS DIRECTED 01/07/17   Birdie Sons, MD  warfarin (COUMADIN) 10 MG tablet TAKE 1 TO 2 TABLETS BY MOUTH AS DIRECTED 09/16/18   Birdie Sons, MD    Family History Family History  Problem Relation Age of Onset   Goiter Mother    Heart attack Father    Hypertension Father    Hypertension Sister    Heart attack Sister    Heart attack Brother    Heart attack Brother    Suicidality Son     Social History Social History   Tobacco Use   Smoking status: Never Smoker   Smokeless tobacco: Never Used  Substance Use Topics   Alcohol use: Yes    Alcohol/week: 0.0 - 4.0 standard drinks   Drug use: No     Allergies   Penicillins   Review of Systems Review of Systems  Unable to perform ROS: Acuity  of condition     Physical Exam Updated Vital Signs BP (!) 167/54    Pulse 65    Resp (!) 24    Ht _0  (1.753 m)    Wt 92 kg    SpO2 100%    BMI 29.95 kg/m   Physical Exam Vitals signs and nursing note reviewed.  Constitutional:      Appearance: He is well-developed.     Comments: Vomiting  HENT:     Head: Normocephalic and atraumatic.     Mouth/Throat:     Comments: Vomiting, gagging, not protecting airway Eyes:     Conjunctiva/sclera: Conjunctivae normal.     Pupils: Pupils are equal, round, and reactive to light.     Comments: Pupils asymmetric, R > L  Neck:     Musculoskeletal: Normal range of motion.  Cardiovascular:     Rate and Rhythm: Normal rate and regular rhythm.     Heart sounds: Normal heart sounds.  Pulmonary:     Effort: Pulmonary effort is normal.     Breath sounds: Normal breath sounds.  Abdominal:     General: Bowel sounds are normal.     Palpations: Abdomen is soft.  Musculoskeletal: Normal range of motion.  Skin:    General: Skin is warm and dry.  Neurological:     Comments: Lethargic, following limited commands on right side-- able to give thumbs up, lift  arm and leg on command; left side is flaccid, leg does withdraw from pain      ED Treatments / Results  Labs (all labs ordered are listed, but only abnormal results are displayed) Labs Reviewed  PROTIME-INR - Abnormal; Notable for the following components:      Result Value   Prothrombin Time 30.7 (*)    INR 3.0 (*)    All other components within normal limits  APTT - Abnormal; Notable for the following components:   aPTT 48 (*)    All other components within normal limits  CBC - Abnormal; Notable for the following components:   WBC 10.8 (*)    RBC 4.01 (*)    Hemoglobin 11.9 (*)    HCT 36.2 (*)    All other components within normal limits  DIFFERENTIAL - Abnormal; Notable for the following components:   Neutro Abs 9.3 (*)    All other components within normal limits  I-STAT CHEM 8, ED - Abnormal; Notable for the following components:   BUN 24 (*)    Glucose, Bld 139 (*)    Hemoglobin 12.2 (*)    HCT 36.0 (*)    All other components within normal limits  CBG MONITORING, ED - Abnormal; Notable for the following components:   Glucose-Capillary 139 (*)    All other components within normal limits  SARS CORONAVIRUS 2 BY RT PCR (HOSPITAL ORDER, [facility])  ETHANOL  COMPREHENSIVE METABOLIC PANEL  RAPID URINE DRUG SCREEN, HOSP PERFORMED  URINALYSIS, ROUTINE W REFLEX MICROSCOPIC    EKG None  Radiology Dg Chest Port 1 View  Result Date: 02/10/2019 CLINICAL DATA:  Intubation EXAM: PORTABLE CHEST 1 VIEW COMPARISON:  07/31/2008 FINDINGS: Endotracheal tube tip is at the level of the clavicular heads. Orogastric tube side port is just below the gastroesophageal junction. Mild cardiomegaly. No focal airspace consolidation or pulmonary edema. IMPRESSION: 1. Endotracheal tube tip at the level of the clavicular heads. 2. Orogastric tube side port just below the gastroesophageal junction. Recommend advancing by 5 cm. Electronically Signed  By: Ulyses Jarred  M.D.   On: 01/19/2019 23:42   Ct Head Code Stroke Wo Contrast  Result Date: 01/25/2019 CLINICAL DATA:  Code stroke.  Left-sided weakness. Unresponsive. EXAM: CT HEAD WITHOUT CONTRAST TECHNIQUE: Contiguous axial images were obtained from the base of the skull through the vertex without intravenous contrast. COMPARISON:  10/13/2018 FINDINGS: Brain: There is massive intraparenchymal hemorrhage centered in the right frontal lobe, measuring 10.4 x 6.1 x 4.8 cm (volume = 160 cm^3). There is leftward midline shift measuring 18 mm. There is subfalcine herniation of the right cingulate gyrus. There is mass effect on the right lateral ventricle. There is intraventricular extension of hemorrhage into the lateral and fourth ventricles. There is a small amount of subarachnoid blood over the right convexity. Vascular: No abnormal hyperdensity of the major intracranial arteries or dural venous sinuses. No intracranial atherosclerosis. Skull: The visualized skull base, calvarium and extracranial soft tissues are normal. Sinuses/Orbits: Fluid level in the right maxillary sinus. The orbits are normal. IMPRESSION: Massive intraparenchymal hemorrhage centered in the right frontal lobe, measuring 160 mL, with leftward midline shift measuring 18 mm and subfalcine herniation of the right cingulate gyrus. Critical Value/emergent results were called by telephone at the time of interpretation on 02/04/2019 at 10:53 pm to Dr. Karena Addison Aroor, who verbally acknowledged these results. Electronically Signed   By: Ulyses Jarred M.D.   On: 02/03/2019 22:53    Procedures Procedure Name: Intubation Date/Time: 01/21/2019 11:16 PM Performed by: Larene Pickett, PA-C Pre-anesthesia Checklist: Patient identified, Emergency Drugs available, Patient being monitored and Timeout performed Oxygen Delivery Method: Non-rebreather mask Preoxygenation: Pre-oxygenation with 100% oxygen Induction Type: Rapid sequence Ventilation: Mask ventilation  without difficulty Laryngoscope Size: Mac and 3 Tube size: 7.5 mm Number of attempts: 1 Airway Equipment and Method: Stylet and Video-laryngoscopy Placement Confirmation: ETT inserted through vocal cords under direct vision and Breath sounds checked- equal and bilateral Secured at: 22 cm Tube secured with: ETT holder Dental Injury: Teeth and Oropharynx as per pre-operative assessment       (including critical care time)  CRITICAL CARE Performed by: Larene Pickett   Total critical care time: 60 minutes  Critical care time was exclusive of separately billable procedures and treating other patients.  Critical care was necessary to treat or prevent imminent or life-threatening deterioration.  Critical care was time spent personally by me on the following activities: development of treatment plan with patient and/or surrogate as well as nursing, discussions with consultants, evaluation of patient's response to treatment, examination of patient, obtaining history from patient or surrogate, ordering and performing treatments and interventions, ordering and review of laboratory studies, ordering and review of radiographic studies, pulse oximetry and re-evaluation of patient's condition.   Medications Ordered in ED Medications  etomidate (AMIDATE) 2 MG/ML injection (has no administration in time range)   stroke: mapping our early stages of recovery book (has no administration in time range)  acetaminophen (TYLENOL) tablet 650 mg (has no administration in time range)    Or  acetaminophen (TYLENOL) 160 MG/5ML solution 650 mg (has no administration in time range)    Or  acetaminophen (TYLENOL) suppository 650 mg (has no administration in time range)  senna-docusate (Senokot-S) tablet 1 tablet (1 tablet Oral Not Given 02/12/19 0333)  pantoprazole (PROTONIX) injection 40 mg (40 mg Intravenous Given 02/12/19 0332)  clevidipine (CLEVIPREX) 0.5 MG/ML infusion (4 mg/hr  Rate/Dose Change 02/12/19  0234)  fentaNYL (SUBLIMAZE) injection 25 mcg (has no administration in time range)  fentaNYL (  SUBLIMAZE) injection 25-100 mcg (50 mcg Intravenous Given 02/12/19 0144)  propofol (DIPRIVAN) 1000 MG/100ML infusion (50 mcg/kg/min  92 kg Intravenous New Bag/Given 02/12/19 0206)  midazolam (VERSED) injection 1 mg (has no administration in time range)  midazolam (VERSED) injection 1 mg (has no administration in time range)  Chlorhexidine Gluconate Cloth 2 % PADS 6 each (has no administration in time range)  chlorhexidine gluconate (MEDLINE KIT) (PERIDEX) 0.12 % solution 15 mL (has no administration in time range)  MEDLINE mouth rinse (15 mLs Mouth Rinse Given 02/12/19 0349)  clevidipine (CLEVIPREX) infusion 0.5 mg/mL (has no administration in time range)  ondansetron (ZOFRAN) 4 MG/2ML injection (4 mg  Given 01/18/2019 2245)  labetalol (NORMODYNE) 5 MG/ML injection (40 mg  Given 01/29/2019 2250)  prothrombin complex conc human (KCENTRA) IVPB 1,652 Units (0 Units Intravenous Stopped 02/12/19 0036)  phytonadione (VITAMIN K) 10 mg in dextrose 5 % 50 mL IVPB (0 mg Intravenous Stopped 02/12/19 0143)  clevidipine (CLEVIPREX) 0.5 MG/ML infusion (2 mg/hr  Rate/Dose Change 02/12/19 0002)  etomidate (AMIDATE) injection (10 mg Intravenous Given 02/01/2019 2303)  succinylcholine (ANECTINE) injection (100 mg Intravenous Given 02/03/2019 2304)  propofol (DIPRIVAN) 1000 MG/100ML infusion (  Rate/Dose Change 02/12/19 0031)     Initial Impression / Assessment and Plan / ED Course  I have reviewed the triage vital signs and the nursing notes.  Pertinent labs & imaging results that were available during my care of the patient were reviewed by me and considered in my medical decision making (see chart for details).  73 year old male presenting to the ED as a code stroke.  He had sudden onset worst headache of his life at 8:30 PM while eating dinner with his family.  Shortly after he began to have a decline in mental status  with left-sided weakness.  He began vomiting in route.  On arrival patient is minimally responsive, following limited commands, continues to vomit.  He was emergently taken to head CT which revealed a large right-sided bleed with 57m midline shift.  Patient was intubated for airway protection after speaking with his wife, Jason Mcdaniel--she is aware of findings and current status, she verbally acknowledged that she was comfortable with intubation but not compressions or other cardiac resuscitation.  Intubation performed without difficulty, good color change and air movement.  Reversal agents started.  NG tube placed.    Dr. ALorraine Laxwith neurology will admit.  He requested critical care consult to assist with vent.  Neurosurgery, Dr. EEllene Routewill also come to evaluate patient.  Spoke with [facility], Dr. OLucile Shutters- they will consult.  Dr. EEllene Routehas spoken with family-- they have opted for conservative management at this time.    Final Clinical Impressions(s) / ED Diagnoses   Final diagnoses:  Right-sided nontraumatic intracerebral hemorrhage, unspecified cerebral location ([facility]    ED Discharge Orders    None       SLarene Pickett PA-C 02/12/19 0432    RQuintella Reichert MD 02/16/19 1309

## 2019-02-12 ENCOUNTER — Inpatient Hospital Stay (HOSPITAL_COMMUNITY): Payer: Managed Care, Other (non HMO)

## 2019-02-12 ENCOUNTER — Telehealth: Payer: Self-pay

## 2019-02-12 DIAGNOSIS — I611 Nontraumatic intracerebral hemorrhage in hemisphere, cortical: Principal | ICD-10-CM

## 2019-02-12 DIAGNOSIS — J9601 Acute respiratory failure with hypoxia: Secondary | ICD-10-CM

## 2019-02-12 DIAGNOSIS — G935 Compression of brain: Secondary | ICD-10-CM

## 2019-02-12 DIAGNOSIS — G936 Cerebral edema: Secondary | ICD-10-CM

## 2019-02-12 LAB — HEPATIC FUNCTION PANEL
ALT: 50 U/L — ABNORMAL HIGH (ref 0–44)
AST: 53 U/L — ABNORMAL HIGH (ref 15–41)
Albumin: 3.6 g/dL (ref 3.5–5.0)
Alkaline Phosphatase: 79 U/L (ref 38–126)
Bilirubin, Direct: 0.2 mg/dL (ref 0.0–0.2)
Indirect Bilirubin: 0.7 mg/dL (ref 0.3–0.9)
Total Bilirubin: 0.9 mg/dL (ref 0.3–1.2)
Total Protein: 6.3 g/dL — ABNORMAL LOW (ref 6.5–8.1)

## 2019-02-12 LAB — POCT I-STAT 7, (LYTES, BLD GAS, ICA,H+H)
Acid-Base Excess: 1 mmol/L (ref 0.0–2.0)
Bicarbonate: 23.7 mmol/L (ref 20.0–28.0)
Calcium, Ion: 1.19 mmol/L (ref 1.15–1.40)
HCT: 30 % — ABNORMAL LOW (ref 39.0–52.0)
Hemoglobin: 10.2 g/dL — ABNORMAL LOW (ref 13.0–17.0)
O2 Saturation: 100 %
Patient temperature: 98.6
Potassium: 4 mmol/L (ref 3.5–5.1)
Sodium: 135 mmol/L (ref 135–145)
TCO2: 25 mmol/L (ref 22–32)
pCO2 arterial: 30.8 mmHg — ABNORMAL LOW (ref 32.0–48.0)
pH, Arterial: 7.493 — ABNORMAL HIGH (ref 7.350–7.450)
pO2, Arterial: 249 mmHg — ABNORMAL HIGH (ref 83.0–108.0)

## 2019-02-12 LAB — PROTIME-INR
INR: 1.4 — ABNORMAL HIGH (ref 0.8–1.2)
INR: 1.2 (ref 0.8–1.2)
Prothrombin Time: 16.6 seconds — ABNORMAL HIGH (ref 11.4–15.2)
Prothrombin Time: 15.2 seconds (ref 11.4–15.2)

## 2019-02-12 LAB — SODIUM
Sodium: 138 mmol/L (ref 135–145)
Sodium: 140 mmol/L (ref 135–145)

## 2019-02-12 LAB — POC SARS CORONAVIRUS 2 AG -  ED: SARS Coronavirus 2 Ag: NEGATIVE

## 2019-02-12 LAB — OSMOLALITY: Osmolality: 284 mOsm/kg (ref 275–295)

## 2019-02-12 LAB — SARS CORONAVIRUS 2 BY RT PCR (HOSPITAL ORDER, PERFORMED IN ~~LOC~~ HOSPITAL LAB): SARS Coronavirus 2: NEGATIVE

## 2019-02-12 LAB — TRIGLYCERIDES: Triglycerides: 98 mg/dL (ref <150)

## 2019-02-12 LAB — MRSA PCR SCREENING: MRSA by PCR: NEGATIVE

## 2019-02-12 MED ORDER — GLYCOPYRROLATE 0.2 MG/ML IJ SOLN
0.2000 mg | INTRAMUSCULAR | Status: DC | PRN
  Administered 2019-02-13: 0.2 mg via INTRAVENOUS
  Filled 2019-02-12 (×2): qty 1

## 2019-02-12 MED ORDER — PROPOFOL 1000 MG/100ML IV EMUL
0.0000 ug/kg/min | INTRAVENOUS | Status: DC
  Administered 2019-02-12: 50 ug/kg/min via INTRAVENOUS
  Administered 2019-02-12: 40 ug/kg/min via INTRAVENOUS
  Administered 2019-02-12: 45 ug/kg/min via INTRAVENOUS
  Filled 2019-02-12 (×4): qty 100

## 2019-02-12 MED ORDER — DIPHENHYDRAMINE HCL 50 MG/ML IJ SOLN: 25.0000 mg | INTRAMUSCULAR | Status: DC | PRN

## 2019-02-12 MED ORDER — MORPHINE SULFATE (PF) 2 MG/ML IV SOLN: 2.0000 mg | INTRAVENOUS | Status: DC | PRN

## 2019-02-12 MED ORDER — FENTANYL CITRATE (PF) 100 MCG/2ML IJ SOLN: 25.0000 ug | INTRAMUSCULAR | Status: DC | PRN

## 2019-02-12 MED ORDER — CHLORHEXIDINE GLUCONATE CLOTH 2 % EX PADS
6.0000 | MEDICATED_PAD | Freq: Every day | CUTANEOUS | Status: DC
  Administered 2019-02-12: 06:00:00 6 via TOPICAL

## 2019-02-12 MED ORDER — GLYCOPYRROLATE 1 MG PO TABS: 1.0000 mg | ORAL_TABLET | ORAL | Status: DC | PRN

## 2019-02-12 MED ORDER — LABETALOL HCL 5 MG/ML IV SOLN
10.0000 mg | INTRAVENOUS | Status: DC | PRN
  Administered 2019-02-12: 20 mg via INTRAVENOUS
  Filled 2019-02-12: qty 8

## 2019-02-12 MED ORDER — DEXTROSE 5 % IV SOLN: INTRAVENOUS | Status: DC

## 2019-02-12 MED ORDER — CLEVIDIPINE BUTYRATE 0.5 MG/ML IV EMUL
0.0000 mg/h | INTRAVENOUS | Status: DC
  Administered 2019-02-12: 8 mg/h via INTRAVENOUS
  Administered 2019-02-12 (×2): 14 mg/h via INTRAVENOUS
  Administered 2019-02-12 (×2): 21 mg/h via INTRAVENOUS
  Filled 2019-02-12 (×5): qty 50

## 2019-02-12 MED ORDER — ORAL CARE MOUTH RINSE
15.0000 mL | OROMUCOSAL | Status: DC
  Administered 2019-02-12 – 2019-02-13 (×9): 15 mL via OROMUCOSAL

## 2019-02-12 MED ORDER — MIDAZOLAM HCL 2 MG/2ML IJ SOLN: 1.0000 mg | INTRAMUSCULAR | Status: DC | PRN

## 2019-02-12 MED ORDER — CHLORHEXIDINE GLUCONATE 0.12% ORAL RINSE (MEDLINE KIT)
15.0000 mL | Freq: Two times a day (BID) | OROMUCOSAL | Status: DC
  Administered 2019-02-12 (×2): 15 mL via OROMUCOSAL

## 2019-02-12 MED ORDER — ACETAMINOPHEN 325 MG PO TABS: 650.0000 mg | ORAL_TABLET | Freq: Four times a day (QID) | ORAL | Status: DC | PRN

## 2019-02-12 MED ORDER — MIDAZOLAM BOLUS VIA INFUSION (WITHDRAWAL LIFE SUSTAINING TX)
2.0000 mg | INTRAVENOUS | Status: DC | PRN
  Filled 2019-02-12: qty 2

## 2019-02-12 MED ORDER — SODIUM CHLORIDE 3 % IV SOLN
INTRAVENOUS | Status: DC
  Administered 2019-02-12: 75 mL/h via INTRAVENOUS
  Filled 2019-02-12 (×4): qty 500

## 2019-02-12 MED ORDER — FENTANYL CITRATE (PF) 100 MCG/2ML IJ SOLN
25.0000 ug | INTRAMUSCULAR | Status: DC | PRN
  Administered 2019-02-12: 50 ug via INTRAVENOUS
  Filled 2019-02-12: qty 2

## 2019-02-12 MED ORDER — MIDAZOLAM 50MG/50ML (1MG/ML) PREMIX INFUSION: 0.0000 mg/h | INTRAVENOUS | Status: DC

## 2019-02-12 MED ORDER — POLYVINYL ALCOHOL 1.4 % OP SOLN
1.0000 [drp] | Freq: Four times a day (QID) | OPHTHALMIC | Status: DC | PRN
  Filled 2019-02-12: qty 15

## 2019-02-12 MED ORDER — ACETAMINOPHEN 650 MG RE SUPP: 650.0000 mg | Freq: Four times a day (QID) | RECTAL | Status: DC | PRN

## 2019-02-12 MED ORDER — HYDRALAZINE HCL 20 MG/ML IJ SOLN: 20.0000 mg | Freq: Four times a day (QID) | INTRAMUSCULAR | Status: DC | PRN

## 2019-02-12 MED ORDER — GLYCOPYRROLATE 0.2 MG/ML IJ SOLN: 0.2000 mg | INTRAMUSCULAR | Status: DC | PRN

## 2019-02-12 MED ORDER — MORPHINE 100MG IN NS 100ML (1MG/ML) PREMIX INFUSION
0.0000 mg/h | INTRAVENOUS | Status: DC
  Administered 2019-02-12: 4 mg/h via INTRAVENOUS
  Administered 2019-02-13: 01:00:00 12 mg/h via INTRAVENOUS
  Filled 2019-02-12 (×3): qty 100

## 2019-02-12 MED ORDER — MORPHINE BOLUS VIA INFUSION
5.0000 mg | INTRAVENOUS | Status: DC | PRN
  Filled 2019-02-12: qty 5

## 2019-02-12 NOTE — Progress Notes (Signed)
NAME:  Jason Mcdaniel, MRN:  [id number], DOB:  [birth year], LOS: 1 ADMISSION DATE:  02/02/2019, CONSULTATION DATE: 02/12/2019 REFERRING MD: Dr. Lorraine Lax, CHIEF COMPLAINT: ICH  Brief History   73 year old male on warfarin for mechanical heart valve presented with [facility] found to have large Right frontal ICH. Intubated for airway protection in ED and PCCM asked to eval.   History of present illness   Patient is encephalopathic and/or intubated. Therefore history has been obtained from chart review.  73 year old male with past medical history as below, which is significant for aortic aneurysm, CAD, aortic valve replacement on Coumadin, hypertension, myocardial infarction, and stroke.  He had sudden onset of worst headache of life around 8:30 PM on 11/27.  Upon arrival to the emergency department systolic blood pressure found to be 190.  Initially he was able to follow commands with his right side and had very limited movement of his left side.  Multiple episodes of vomiting.  He went for CT of the head which showed a large right frontal hemorrhage with 18 mm of midline shift.  He was started on Cleviprex for blood pressure control.  Warfarin reversed with Kcentra and vitamin K.  He required intubation for airway protection.  He was evaluated by neurosurgery in the emergency department who recommended conservative management.  He was admitted to the neurology team and PCCM was asked to consult for ventilator management.  Past Medical History   Past Medical History:  Diagnosis Date  . AAA (abdominal aortic aneurysm) ([place])   . 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 Coronary artery disease   . Descending thoracic aortic aneurysm [facility]) 09/05/2011   08/2011 CT: Interval enlargement of the associated descending thoracic aortic    . 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 Dysrhythmia   . Epistaxis 09/05/2011   07/2011 - Seen by ENT - resolved.  [name] History of aortic valve replacement    a.  St. Jude mechanical valve via right mini thoracotomy by Dr Gerrit Friends at [facility] ;  b. 02/2011 Echo EF 55-65%, Gr 2 DD, Triv AI, Mild MR.  [name] History of thoracic aortic aneurysm repair 02/09/2002   straight graft replacement of ascending thoracic aorta for acute type A aortic dissection (DeBakey type II) with hemiarch distal arch anastamosis and supracoronary proximal anastamosis   . HOH (hard of hearing)   . 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 Hypertension   . Myocardial infarction ([place]) 2004  . Peripheral vascular disease ([place])   . Stroke ([place]) 1999  . TIA (transient ischemic attack) 09/05/2011   Attributed to antihypertensive medications     Significant Hospital Events   11/27 admit  Consults:  PCCM Neurosurgery  Procedures:  None  Significant Diagnostic Tests:  CT head 11/27 > Massive intraparenchymal hemorrhage centered in the right frontal lobe, measuring 160 mL, with leftward midline shift measuring 18 mm\and subfalcine herniation of the right cingulate gyrus.  Repeat CT head 11/27-0850 IMPRESSION: 1. Unchanged size of large right frontal lobe intraparenchymal hemorrhage with unchanged edema and midline shift. 2. Slightly increased volume of intraventricular hemorrhage. 3. Unchanged small volume subarachnoid hemorrhage.  Micro Data:    Antimicrobials:     Interim history/subjective:  No overnight events Spontaneously moving right lower extremity  Objective   Blood pressure (!) 126/53, pulse 60, temperature 99.1 F (37.3 C), temperature source Axillary, resp. rate 16, height 5\' 9"  (1.753 m), weight 92 kg, SpO2 100 %.    Vent Mode: PRVC FiO2 (%):  [50 %-100 %] 50 % Set Rate:   [16 bmp-22 bmp] 16 bmp Vt Set:  [560 mL] 560 mL PEEP:  [5 cmH20] 5 cmH20 Plateau Pressure:  [14 cmH20] 14 cmH20   Intake/Output Summary (Last 24 hours) at 02/12/2019 [id number] Last data filed at 02/12/2019 [id number] Gross per 24 hour  Intake 326.5 ml  Output -  Net 326.5 ml   Filed Weights   02/05/2019 2316  Weight: 92 kg    Examination: General: Elderly gentleman, comfortable on the ventilator HENT: Moist oral mucosa, endotracheal tube in place Lungs: Clear breath sounds bilaterally Cardiovascular: S1-S2 appreciated Abdomen: Soft, bowel sounds appreciated Extremities: No clubbing, no edema Neuro: Sedated GU: Fair output  Resolved Hospital Problem list     Assessment & Plan:  Intracerebral hemorrhage Right frontal hemorrhage with volume of 160 male with 18 mm midline shift -Management per reviewed neurology -Neurosurgery recommends conservative management with protection for poor prognosis -Cleviprex infusion for systolic blood pressure goal of less than 140 -Received Kcentra and vitamin K  Acute respiratory failure -Full ventilator support -Monitor arterial blood gases -VAP bundle in place -Wean as able  Mechanical aortic valve -Coumadin on hold -Trend INR  Transaminitis -Follow LFTs  Hyperglycemia  Patient just obtained a repeat CT scan of the head -Prognosis is poor -Continue to follow -CT head is stable, slight worsening of intraventricular hemorrhage, volume of intracerebral hemorrhage is stable   Best practice:  Diet: N.p.o. Pain/Anxiety/Delirium protocol (if indicated): Propofol and fentanyl VAP protocol (if indicated): In place DVT prophylaxis: SCD GI prophylaxis: PPI Glucose control: We will monitor Mobility: Bedrest Code Status: Partial Family Communication: Per primary Disposition: ICU  Labs   CBC: Recent Labs  Lab 02/04/2019 2239 01/20/2019 2303 02/12/19 0141  WBC 10.8*  --   --   NEUTROABS 9.3*  --   --   HGB 11.9* 12.2* 10.2*  HCT 36.2*  36.0* 30.0*  MCV 90.3  --   --   PLT 194  --   --     Basic Metabolic Panel: Recent Labs  Lab 02/14/2019 2239 02/12/2019 2303 02/12/19 0141 02/12/19 0719  NA 137 136 135 138  K 3.9 3.9 4.0  --   CL 103 103  --   --   CO2 21*  --   --   --   GLUCOSE 145* 139*  --   --   BUN 23 24*  --   --   CREATININE 0.94 0.90  --   --   CALCIUM 9.2  --   --   --    GFR: Estimated Creatinine Clearance: 79.4 mL/min (by C-G formula based on SCr of 0.9 mg/dL). Recent Labs  Lab 02/04/2019 2239  WBC 10.8*    Liver Function Tests: Recent Labs  Lab 01/26/2019 2239 02/12/19 0430  AST 65* 53*  ALT 55* 50*  ALKPHOS 87 79  BILITOT 1.0 0.9  PROT 6.6 6.3*  ALBUMIN 3.8 3.6   No results for input(s): LIPASE, AMYLASE in the last 168 hours. No results for input(s): AMMONIA in the last 168 hours.  ABG    Component Value Date/Time   PHART 7.493 (H) 02/12/2019 0141   PCO2ART 30.8 (L) 02/12/2019 0141  PO2ART 249.0 (H) 02/12/2019 0141   HCO3 23.7 02/12/2019 0141   TCO2 25 02/12/2019 0141   O2SAT 100.0 02/12/2019 0141     Coagulation Profile: Recent Labs  Lab 02/07/2019 2239 02/12/19 0430  INR 3.0* 1.4*    The patient is critically ill with multiple organ systems failure and requires high complexity decision making for assessment and support, frequent evaluation and titration of therapies, application of advanced monitoring technologies and extensive interpretation of multiple databases. Critical Care Time devoted to patient care services described in this note independent of APP/resident time (if applicable)  is 30 minutes.   Sherrilyn Rist MD [place] [facility] Personal pager: [phone] If unanswered, please page CCM On-call: [phone]

## 2019-02-12 NOTE — Progress Notes (Signed)
Following discussions with family members  Withdrawal of life-sustaining measures will be put in place Comfort measures only

## 2019-02-12 NOTE — Progress Notes (Signed)
SLP Cancellation Note  Patient Details Name: ETHANMICHAEL FLORES MRN: [id number] DOB: 01-Aug-1945   Cancelled treatment:       Reason Eval/Treat Not Completed: Medical issues which prohibited therapy   Pt currently intubated and RT present to transport pt to CT. ST to follow.    Brandyn Thien 02/12/2019, 9:21 AM

## 2019-02-12 NOTE — Progress Notes (Signed)
PT Cancellation Note  Patient Details Name: JABAREE STRANTZ MRN: [id number] DOB: 01-26-1946   Cancelled Treatment:    Reason Eval/Treat Not Completed: Patient not medically ready(pt on vent and bedrest)   Hamlet Lasecki B Danalee Flath 02/12/2019, 7:26 AM  Bayard Males, PT Acute Rehabilitation Services Pager: [phone] Office: [phone]

## 2019-02-12 NOTE — Procedures (Signed)
Extubation Procedure Note  Patient Details:   Name: Jason Mcdaniel DOB: [birth year] MRN: [id number]   Airway Documentation:  Airway (Active)   Vent end date: 02/12/19 Vent end time: [id number]   Pt extubated per MD order Family at bedside   Ciro Backer 02/12/2019, 4:50 PM

## 2019-02-12 NOTE — Progress Notes (Signed)
Pt transported from 4N21 to CT and back without any complications.

## 2019-02-12 NOTE — Progress Notes (Signed)
Patient transported to 4N21 without any complications

## 2019-02-12 NOTE — Telephone Encounter (Signed)
Copied from [place] [phone]. Topic: General - Other >> Feb 12, 2019 10:15 AM Jason Mcdaniel wrote: Reason for CRM: pt's wife called wanting Dr. Caryn Section to know Mr. Naasz had a severe stroke last night and is in ICU at [facility].

## 2019-02-12 NOTE — Progress Notes (Signed)
STROKE TEAM PROGRESS NOTE   HISTORY OF PRESENT ILLNESS (per record) Jason Mcdaniel is a 73 y.o. 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 EMS called and patient unresponsive with left side weakness. BP 99991111 systolic.  On arrival, patient follows commands with right side. Left UE plegic and left leg moves but not against gravity. Forced gaze deviation to the right and dilated right pupil. Vomiting at scene. Stat CT head showed large left frontal bleed with 18 mm midline shift. Labetalol and clevepirix started immediately for BP control and patient intubated for airway. Kcentra given for reversal.  INR was 3.0 NS consulted for hematoma evacuation given significant midline shift.  According to the daughter, patient was complained of mild headache earlier in the evening.  Had taken an Aleve.  Later after dinner between 8:30 PM and 9 PM patient developed sudden onset severe headache and then she witnessed him have significant slurred speech, become slumped on the sofa and stopped using his left side.  Called 911 immediately. ICH score 2  (1 for GCS and 1 for size of hematoma)   INTERVAL HISTORY  I have personally reviewed history of presenting illness, electronic medical records and imaging films in PACS.  Patient remains comatose unresponsive and is intubated for respiratory failure and is on sedation.  His blood pressure is controlled on Cardene drip and he has been started on hypertonic saline.  Repeat CT scan of the head this morning shows stable appearance of the large right parenchymal intracerebral hemorrhage with intraventricular extension and 19 mm right-to-left midline shift and transfer will sign brain herniation.    OBJECTIVE Vitals:   02/12/19 0445 02/12/19 0500 02/12/19 0600 02/12/19 0803  BP: (!) 121/52 (!) 120/51 (!) 157/58 (!) 126/53  Pulse: 60 (!) 59 69 60  Resp: 16 16 16  16   Temp:    99.1 F (37.3 C)  TempSrc:    Axillary  SpO2: 100% 100% 100% 100%  Weight:      Height:        CBC:  Recent Labs  Lab 01/25/2019 2239 02/05/2019 2303 02/12/19 0141  WBC 10.8*  --   --   NEUTROABS 9.3*  --   --   HGB 11.9* 12.2* 10.2*  HCT 36.2* 36.0* 30.0*  MCV 90.3  --   --   PLT 194  --   --     Basic Metabolic Panel:  Recent Labs  Lab 01/27/2019 2239 02/04/2019 2303 02/12/19 0141 02/12/19 0719  NA 137 136 135 138  K 3.9 3.9 4.0  --   CL 103 103  --   --   CO2 21*  --   --   --   GLUCOSE 145* 139*  --   --   BUN 23 24*  --   --   CREATININE 0.94 0.90  --   --   CALCIUM 9.2  --   --   --     Lipid Panel:     Component Value Date/Time   CHOL 151 09/24/2018 0820   TRIG 98 02/12/2019 0430   HDL 36 (L) 09/24/2018 0820   CHOLHDL 4.2 09/24/2018 0820   LDLCALC 91 09/24/2018 0820   HgbA1c:  Lab Results  Component Value Date   HGBA1C 5.1 08/10/2011   Urine Drug Screen: No results found for: LABOPIA, COCAINSCRNUR, LABBENZ, AMPHETMU, THCU, LABBARB  Alcohol Level  Component Value Date/Time   [facility] <10 02/10/2019 2239    IMAGING  Dg Chest Port 1 View 02/06/2019 IMPRESSION:  1. Endotracheal tube tip at the level of the clavicular heads.  2. Orogastric tube side port just below the gastroesophageal junction. Recommend advancing by 5 cm.   Ct Head Code Stroke Wo Contrast 01/20/2019 IMPRESSION:  Massive intraparenchymal hemorrhage centered in the right frontal lobe, measuring 160 mL, with leftward midline shift measuring 18 mm and subfalcine herniation of the right cingulate gyrus.   CT Head WO Contrast  02/12/19-Unchanged size of large right frontal lobe intraparenchymal hemorrhage with unchanged edema and midline shift.    ECG - SR rate 77 BPM. (See cardiology reading for complete details)    PHYSICAL EXAM Blood pressure (!) 126/53, pulse 60, temperature 99.1 F (37.3 C), temperature source Axillary, resp. rate 16, height 5\' 9"  (1.753 m),  weight 92 kg, SpO2 100 %. Frail elderly Caucasian male sedated intubated not in distress. . Afebrile. Head is nontraumatic. Neck is supple without bruit.    Cardiac exam no murmur or gallop. Lungs are clear to auscultation. Distal pulses are well felt.  Neurological Exam :  Patient is comatose and unresponsive.  He is sedated intubated.  Eyes are closed.  There is skew deviation of the eyes with left eye being hypertrophic.  Right pupil is 3 mm sluggishly reactive left is 2 mm minimally reactive.  Corneal reflexes sluggish bilaterally.  Doll's eye movements are sluggish.  Patient has weak cough and gag.  There is no spontaneous extremity movements.  He withdraws semipurposefully right upper and lower extremity to sternal rub.  There is no movement on the left side even to painful stimulation.  Tone is diminished on the left compared to the right.  Deep tendon reflexes are preserved on the right and absent on the left.  Right plantar is downgoing left is upgoing.   ASSESSMENT/PLAN Jason Mcdaniel is a 73 y.o. 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 Vomiting at scene.  He did not receive IV t-PA due to [place].   Stroke: large left frontal bleed with 18 mm midline shift. ICH score 2  Resultant comatose state with left hemiplegia  Code Stroke CT Head - Massive intraparenchymal hemorrhage centered in the right frontal lobe, measuring 160 mL, with leftward midline shift measuring 18 mm and subfalcine herniation of the right cingulate gyrus. CT head repeat  02/12/19- Unchanged size of large right frontal lobe intraparenchymal  hemorrhage with unchanged edema and midline shift.  MRI head - not ordered  MRA head - not ordered  CTA  H&N - not ordered  CT Perfusion - not ordered  Carotid Doppler - not ordered  2D Echo - not ordered  [employer] Virus 2 - negative  LDL - 91  HgbA1c - pending  UDS - pending  VTE prophylaxis - SCDs Diet  Diet Order            Diet NPO time specified  Diet effective now              Warfarin daily and ASA 81 mg daily prior to admission, now on No antithrombotic  Patient counseled to be compliant with his antithrombotic medications  Ongoing aggressive stroke risk factor management  Therapy recommendations:  pending  Disposition:  Pending  Cerebral Edema  18 mm midline shift  Hypertonic saline @ 75 cc / hr  Na - 138  NS consult - Dr Ellene Route - surgery not recommended due to poor prognosis  Hypertension  Home BP meds: Coreg ; Norvasc  Current BP meds: Cleviprex  Stable . SBP goal < 140 initially  . Long-term BP goal normotensive  Hyperlipidemia  Home Lipid lowering medication: Pravachol 80 mg daily  LDL 91, goal < 70  Current lipid lowering medication: none - contraindicated with ICH  Continue statin at discharge  Other Stroke Risk Factors  Advanced age  ETOH use, advised to drink no more than 1 alcoholic beverage per day.  Obesity, Body mass index is 29.95 kg/m., recommend weight loss, diet and exercise as appropriate   Hx stroke/TIA  Coronary artery disease  Mechanical Heart Valve - warfarin  Other Active Problems  Warfarin PTA for mechanical heart valve (St Jude Aortic valve) - reversed with Kcentra - INR 3.0->1.4  Anemia - Hb - 10.2  Temp 99.1 - WBCs 10.8   Hospital day # 1  I have personally obtained history,examined this patient, reviewed notes, independently viewed imaging studies, participated in medical decision making and plan of care.ROS completed by me personally and pertinent positives fully documented  I have made any additions or clarifications directly to the above note.  Patient has presented with a massive  warfarin related parenchymal intracerebral hemorrhage with 18 mm transfalcine right to left brain herniation and cytotoxic edema and intraventricular extension.  His INR was reversed and blood pressure is tightly controlled and follow-up CT scan shows stable appearance of the hemorrhage however unfortunately this hemorrhage is nonsurvivable and patient  is not a candidate for craniotomy or even ventriculostomy at this point.  I am awaiting arrival of his wife to have goals of care discussion and hope to make him DNR and comfort care and withdraw ventilatory support as it will be futile care in my opinion.  Discussed with critical care MD who is in agreement with plan.  This patient is critically ill and at significant risk of neurological worsening, death and care requires constant monitoring of vital signs, hemodynamics,respiratory and cardiac monitoring, extensive review of multiple databases, frequent neurological assessment, discussion with family, other specialists and medical decision making of high complexity.I have made any additions or clarifications directly to the above note.This critical care time does not reflect procedure time, or teaching time or supervisory time of PA/NP/Med Resident etc but could involve care discussion time.  I spent 50 minutes of neurocritical care time  in the care of  this patient.     Antony Contras, MD Medical Director [facility] Pager: [phone] 02/12/2019 11:13 AM   To contact Stroke Continuity provider, please refer to [email or web address] After hours, contact General Neurology

## 2019-02-12 NOTE — Progress Notes (Signed)
OT Cancellation Note  Patient Details Name: Jason Mcdaniel MRN: [id number] DOB: 03-Sep-1945   Cancelled Treatment:    Reason Eval/Treat Not Completed: Patient not medically ready(Pt on bedrest and vented. OT to continue to follow for eval.)   Darryl Nestle) Marsa Aris OTR/L Acute Rehabilitation Services Pager: [phone] Office: [place] 02/12/2019, 7:34 AM

## 2019-02-12 NOTE — Consult Note (Addendum)
Reason for Consult: Intracerebral hemorrhage Referring Physician: Karena Addison Aroor MD  Jason Mcdaniel is an 73 y.o. male.  HPI: Jason Mcdaniel is a 73 year old individual who has a history of an artificial valve and is on Coumadin anticoagulation.  His family notes that he has been having some arthritic type pain and has taken Advil and today was noted to have taken some Aleve.  He had developed significant headache followed by hemiplegia and presented to the emergency department with the symptoms.  He required intubation.  CT scan demonstrates presence of a large right frontal intracerebral hemorrhage measuring 160 mL of volume with approximately 16 mm of right to left shift and subfalcine herniation.  Initially had a phone conversation with Dr. Lorraine Lax regarding surgical intervention and was asked to evaluate him and give opinion regarding surgery.  Past Medical History:  Diagnosis Date  . AAA (abdominal aortic aneurysm) ([place])   . 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 Coronary artery disease   . 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 Descending thoracic aortic dissection ([place]) 04/25/2006   Acute Type B aortic dissection   . Dysrhythmia   . Epistaxis 09/05/2011   07/2011 - Seen by ENT - resolved.  [name] History of aortic valve replacement    a.  St. Jude mechanical valve via right mini thoracotomy by Dr Gerrit Friends at [facility] ;  b. 02/2011 Echo EF 55-65%, Gr 2 DD, Triv AI, Mild MR.  [name] History of thoracic aortic aneurysm repair 02/09/2002   straight graft replacement of ascending thoracic aorta for acute type A aortic dissection (DeBakey type II) with  hemiarch distal arch anastamosis and supracoronary proximal anastamosis   . HOH (hard of hearing)   . 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 Hypertension   . Myocardial infarction ([place]) 2004  . Peripheral vascular disease ([place])   . Stroke ([place]) 1999  . TIA (transient ischemic attack) 09/05/2011   Attributed to antihypertensive medications    Past Surgical History:  Procedure Laterality Date  . ABDOMINAL AORTIC ANEURYSM REPAIR  12/02/11   [facility]  . CARDIAC CATHETERIZATION    . CARDIAC VALVE REPLACEMENT  2001   aortic valve replaced with St Jude prosthetic valve  . CATARACT EXTRACTION W/PHACO Left 09/26/2015   Procedure: CATARACT EXTRACTION PHACO AND INTRAOCULAR LENS PLACEMENT (IOC);  Surgeon: Birder Robson, MD;  Location: [facility] ORS;  Service: Ophthalmology;  Laterality: Left;  [place] 30.0 AP% 23.4 CDE 7.08 Fluid pack lot # [id number]  . CATARACT EXTRACTION W/PHACO Right 10/10/2015   Procedure: CATARACT EXTRACTION PHACO AND INTRAOCULAR LENS PLACEMENT (IOC);  Surgeon: Birder Robson, MD;  Location: [facility] ORS;  Service: Ophthalmology;  Laterality: Right;  [place] 38.4 AP% 18.7 CDE 7.18 Fluid pack lot # [id number]  . VASCULAR SURGERY    . VOCAL CORD INJECTION  2015    Family History  Problem Relation Age of Onset  . Goiter Mother   . Heart attack Father   . Hypertension Father   . Hypertension Sister   . Heart attack Sister   . Heart attack Brother   . Heart attack Brother   . Suicidality Son     Social History:  reports that  he has never smoked. He has never used smokeless tobacco. He reports current alcohol use. He reports that he does not use drugs.  Allergies:  Allergies  Allergen Reactions  . Penicillins Hives and Nausea Only    Has patient had a PCN reaction causing immediate rash, facial/tongue/throat swelling, SOB or lightheadedness with hypotension: Hives Has patient had a PCN reaction causing severe rash involving mucus membranes or skin necrosis: No Has patient had a PCN reaction that  required hospitalization No Has patient had a PCN reaction occurring within the last 10 years: No If all of the above answers are "NO", then may proceed with Cephalosporin use.    Medications: I have reviewed the patient's current medications.  Results for orders placed or performed during the hospital encounter of 01/31/2019 (from the past 48 hour(s))  Ethanol     Status: None   Collection Time: 02/04/2019 10:39 PM  Result Value Ref Range   Alcohol, Ethyl (B) <10 <10 mg/dL    Comment: (NOTE) Lowest detectable limit for serum alcohol is 10 mg/dL. For medical purposes only. Performed at [facility], [place] [address] [place], [place] [address]   Protime-INR     Status: Abnormal   Collection Time: 02/14/2019 10:39 PM  Result Value Ref Range   Prothrombin Time 30.7 (H) 11.4 - 15.2 seconds   INR 3.0 (H) 0.8 - 1.2    Comment: (NOTE) INR goal varies based on device and disease states. Performed at [facility], [place] [address], [place], [place] [address]   APTT     Status: Abnormal   Collection Time: 02/14/2019 10:39 PM  Result Value Ref Range   aPTT 48 (H) 24 - 36 seconds    Comment:        IF BASELINE aPTT IS ELEVATED, SUGGEST PATIENT RISK ASSESSMENT BE USED TO DETERMINE APPROPRIATE ANTICOAGULANT THERAPY. Performed at [facility], [place] [address], [place], [place] [address]   CBC     Status: Abnormal   Collection Time: 01/25/2019 10:39 PM  Result Value Ref Range   WBC 10.8 (H) 4.0 - 10.5 K/uL   RBC 4.01 (L) 4.22 - 5.81 MIL/uL   Hemoglobin 11.9 (L) 13.0 - 17.0 g/dL   HCT 36.2 (L) 39.0 - 52.0 %   MCV 90.3 80.0 - 100.0 fL   MCH 29.7 26.0 - 34.0 pg   MCHC 32.9 30.0 - 36.0 g/dL   RDW 13.1 11.5 - 15.5 %   Platelets 194 150 - 400 K/uL   nRBC 0.0 0.0 - 0.2 %    Comment: Performed at [facility], [place] [address] [place], [place] [address]  Differential     Status: Abnormal   Collection Time: 02/06/2019 10:39 PM  Result Value Ref Range   Neutrophils Relative % 86  %   Neutro Abs 9.3 (H) 1.7 - 7.7 K/uL   Lymphocytes Relative 8 %   Lymphs Abs 0.8 0.7 - 4.0 K/uL   Monocytes Relative 5 %   Monocytes Absolute 0.6 0.1 - 1.0 K/uL   Eosinophils Relative 1 %   Eosinophils Absolute 0.1 0.0 - 0.5 K/uL   Basophils Relative 0 %   Basophils Absolute 0.0 0.0 - 0.1 K/uL   Immature Granulocytes 0 %   Abs Immature Granulocytes 0.04 0.00 - 0.07 K/uL    Comment: Performed at [place] [address] [place], [place] [address]  Comprehensive metabolic panel     Status: Abnormal  Collection Time: 01/24/2019 10:39 PM  Result Value Ref Range   Sodium 137 135 - 145 mmol/L   Potassium 3.9 3.5 - 5.1 mmol/L   Chloride 103 98 - 111 mmol/L   CO2 21 (L) 22 - 32 mmol/L   Glucose, Bld 145 (H) 70 - 99 mg/dL   BUN 23 8 - 23 mg/dL   Creatinine, Ser 0.94 0.61 - 1.24 mg/dL   Calcium 9.2 8.9 - 10.3 mg/dL   Total Protein 6.6 6.5 - 8.1 g/dL   Albumin 3.8 3.5 - 5.0 g/dL   AST 65 (H) 15 - 41 U/L   ALT 55 (H) 0 - 44 U/L   Alkaline Phosphatase 87 38 - 126 U/L   Total Bilirubin 1.0 0.3 - 1.2 mg/dL   GFR calc non Af Amer >60 >60 mL/min   GFR calc Af Amer >60 >60 mL/min   Anion gap 13 5 - 15    Comment: Performed at [facility], [address] [place], [place] [address]  CBG monitoring, ED     Status: Abnormal   Collection Time: 01/30/2019 11:00 PM  Result Value Ref Range   Glucose-Capillary 139 (H) 70 - 99 mg/dL  I-stat chem 8, ED     Status: Abnormal   Collection Time: 01/24/2019 11:03 PM  Result Value Ref Range   Sodium 136 135 - 145 mmol/L   Potassium 3.9 3.5 - 5.1 mmol/L   Chloride 103 98 - 111 mmol/L   BUN 24 (H) 8 - 23 mg/dL   Creatinine, Ser 0.90 0.61 - 1.24 mg/dL   Glucose, Bld 139 (H) 70 - 99 mg/dL   Calcium, Ion 1.15 1.15 - 1.40 mmol/L   TCO2 24 22 - 32 mmol/L   Hemoglobin 12.2 (L) 13.0 - 17.0 g/dL   HCT 36.0 (L) 39.0 - 52.0 %  POC SARS Coronavirus 2 Ag-ED -     Status: None   Collection Time: 02/12/19 12:02 AM  Result Value Ref Range   SARS  Coronavirus 2 Ag NEGATIVE NEGATIVE    Comment: (NOTE) SARS-CoV-2 antigen NOT DETECTED.  Negative results are presumptive.  Negative results do not preclude SARS-CoV-2 infection and should not be used as the sole basis for treatment or other patient management decisions, including infection  control decisions, particularly in the presence of clinical signs and  symptoms consistent with COVID-19, or in those who have been in contact with the virus.  Negative results must be combined with clinical observations, patient history, and epidemiological information. The expected result is Negative. Fact Sheet for Patients: PodPark.tn Fact Sheet for Healthcare Providers: GiftContent.is This test is not yet approved or cleared by the [place] FDA and  has been authorized for detection and/or diagnosis of SARS-CoV-2 by FDA under an Emergency Use Authorization (EUA).  This EUA will remain in effect (meaning this test can be used) for the duration of  the COVID-19 de claration under Section 564(b)(1) of the Act, 21 U.S.C. section 360bbb-3(b)(1), unless the authorization is terminated or revoked sooner.     Dg Chest Port 1 View  Result Date: 01/18/2019 CLINICAL DATA:  Intubation EXAM: PORTABLE CHEST 1 VIEW COMPARISON:  07/31/2008 FINDINGS: Endotracheal tube tip is at the level of the clavicular heads. Orogastric tube side port is just below the gastroesophageal junction. Mild cardiomegaly. No focal airspace consolidation or pulmonary edema. IMPRESSION: 1. Endotracheal tube tip at the level of the clavicular heads. 2. Orogastric tube side port just below the gastroesophageal junction. Recommend advancing by  5 cm. Electronically Signed   By: Ulyses Jarred M.D.   On: 01/20/2019 23:42   Ct Head Code Stroke Wo Contrast  Result Date: 01/26/2019 CLINICAL DATA:  Code stroke.  Left-sided weakness. Unresponsive. EXAM: CT HEAD WITHOUT CONTRAST  TECHNIQUE: Contiguous axial images were obtained from the base of the skull through the vertex without intravenous contrast. COMPARISON:  10/13/2018 FINDINGS: Brain: There is massive intraparenchymal hemorrhage centered in the right frontal lobe, measuring 10.4 x 6.1 x 4.8 cm (volume = 160 cm^3). There is leftward midline shift measuring 18 mm. There is subfalcine herniation of the right cingulate gyrus. There is mass effect on the right lateral ventricle. There is intraventricular extension of hemorrhage into the lateral and fourth ventricles. There is a small amount of subarachnoid blood over the right convexity. Vascular: No abnormal hyperdensity of the major intracranial arteries or dural venous sinuses. No intracranial atherosclerosis. Skull: The visualized skull base, calvarium and extracranial soft tissues are normal. Sinuses/Orbits: Fluid level in the right maxillary sinus. The orbits are normal. IMPRESSION: Massive intraparenchymal hemorrhage centered in the right frontal lobe, measuring 160 mL, with leftward midline shift measuring 18 mm and subfalcine herniation of the right cingulate gyrus. Critical Value/emergent results were called by telephone at the time of interpretation on 01/28/2019 at 10:53 pm to Dr. Karena Addison Aroor, who verbally acknowledged these results. Electronically Signed   By: Ulyses Jarred M.D.   On: 02/07/2019 22:53    Review of Systems  Unable to perform ROS: Acuity of condition   Blood pressure (!) 129/52, pulse 65, resp. rate 18, height 5\' 9"  (1.753 m), weight 92 kg, SpO2 99 %. Physical Exam  Constitutional: He appears well-developed and well-nourished.  HENT:  Head: Normocephalic and atraumatic.  Eyes:  Right pupil is 3 mm left pupil is 2 mm both are reactive.  There is a positive doll's maneuver.  No blink to threat is noted.  The patient does gag on his endotracheal tube.  He has some weak movement of the right upper extremity and spontaneous movement of both lower  extremities reflexively but not purposefully.  There is spontaneous clonus and upgoing Babinski's in both lower extremities.  Neurological:  Patient is intubated and see the eyes exam for other details of his cranial nerve function and level of function  Skin: Skin is warm and dry.    Assessment/Plan: Large right frontal intracerebral hemorrhage measured 160 mL volume with 16 mm of shift and subfalcine herniation.  Patient has a mechanical heart valve that requires anticoagulation.  I do not recommend surgical intervention for this large hemorrhage as these surgical adventures and the hemorrhage itself results in a poor prognosis.  In light of his medical problems and the size of this hemorrhage I have advised conservative management.  I have spoken with the patient's son and daughter who are here and separately by telephone with the patient's wife who is in transit to the hospital.  The wife had noted his wishes that he should not be maintained in a dependent state should some tragedy befall him and at this point is survival with such a large hemorrhage is not likely and if he were to survive he would be a completely dependent individual.  Both children and the wife are in agreement with conservative management.  45 minutes was spent in evaluation consultation and discussion with the family  Earleen Newport 02/12/2019, 12:37 AM

## 2019-02-12 NOTE — Progress Notes (Signed)
Initial Nutrition Assessment  DOCUMENTATION CODES:   Not applicable  INTERVENTION:   If within goals of care recommend Vital High Protein @ 20 ml/hr with 60 ml Prostat TID  Provides: 1080 kcal, 132 grams protein, and 401 ml free water.  TF regimen cleviprex, and propofol at current rate providing 3136 total kcal/day   NUTRITION DIAGNOSIS:   Inadequate oral intake related to inability to eat as evidenced by NPO status.  GOAL:   Patient will meet greater than or equal to 90% of their needs  MONITOR:   Vent status  REASON FOR ASSESSMENT:   Ventilator    ASSESSMENT:   Pt with PMH of mechanical heart valve now admitted with large R frontal ICH 18 mm shift.   Per MD poor prognosis, plan for conservative management.   Patient is currently intubated on ventilator support MV: 8.8 L/min Temp (24hrs), Avg:99.1 F (37.3 C), Min:98.6 F (37 C), Max:99.6 F (37.6 C)  Propofol: 27 ml/hr provides: 712 kcal  Cleviprex @ 28 ml/hr provides: 1344 kcal  Medications reviewed and include: 3% hypertonic saline  Labs reviewed    NUTRITION - FOCUSED PHYSICAL EXAM:  Deferred   Diet Order:   Diet Order            Diet NPO time specified  Diet effective now              EDUCATION NEEDS:   No education needs have been identified at this time  Skin:  Skin Assessment: Reviewed RN Assessment  Last BM:  unknown  Height:   Ht Readings from Last 1 Encounters:  02/09/2019 5\' 9"  (1.753 m)    Weight:   Wt Readings from Last 1 Encounters:  02/12/2019 92 kg    Ideal Body Weight:  72.7 kg  BMI:  Body mass index is 29.95 kg/m.  Estimated Nutritional Needs:   Kcal:  1925  Protein:  115-130 grams  Fluid:  >1.9 L/day  Maylon Peppers RD, LDN, CNSC [phone] Pager [phone] After Hours Pager

## 2019-02-12 NOTE — Consult Note (Signed)
NAME:  Jason Mcdaniel, MRN:  [id number], DOB:  11/09/1945, LOS: 1 ADMISSION DATE:  01/21/2019, CONSULTATION DATE:  11/27 REFERRING MD:  Dr. Lorraine Lax, CHIEF COMPLAINT:  ICH   Brief History   73 year old male on warfarin for mechanical heart valve presented with [facility] found to have large Right frontal ICH. Intubated for airway protection in ED and PCCM asked to eval.   History of present illness   Patient is encephalopathic and/or intubated. Therefore history has been obtained from chart review.  73 year old male with past medical history as below, which is significant for aortic aneurysm, CAD, aortic valve replacement on Coumadin, hypertension, myocardial infarction, and stroke.  He had sudden onset of worst headache of life around 8:30 PM on 11/27.  Upon arrival to the emergency department systolic blood pressure found to be 190.  Initially he was able to follow commands with his right side and had very limited movement of his left side.  Multiple episodes of vomiting.  He went for CT of the head which showed a large right frontal hemorrhage with 18 mm of midline shift.  He was started on Cleviprex for blood pressure control.  Warfarin reversed with Kcentra and vitamin K.  He required intubation for airway protection.  He was evaluated by neurosurgery in the emergency department who recommended conservative management.  He was admitted to the neurology team and PCCM was asked to consult for ventilator management.  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   Significant Hospital Events   11/27 admit  Consults:  PCCM Neurosurgery  Procedures:    Significant Diagnostic Tests:  CT head 11/27 > Massive intraparenchymal hemorrhage centered in the right frontal lobe, measuring 160 mL, with leftward midline shift measuring 18 mm\and subfalcine herniation of the right cingulate gyrus.  Micro Data:    Antimicrobials:    Interim history/subjective:    Objective   Blood pressure (!) 126/49, pulse 66, resp. rate 18, height 5\' 9"  (1.753 m), weight 92 kg, SpO2 99 %.    Vent Mode: PRVC FiO2 (%):  [100 %] 100 % Set Rate:  [18 bmp] 18 bmp Vt Set:  [560 mL] 560 mL PEEP:  [5 cmH20] 5 cmH20 Plateau Pressure:  [14 cmH20] 14 cmH20  No intake or output data in the 24 hours ending 02/12/19 0050 Filed Weights   02/09/2019 2316  Weight: 92 kg    Examination: General: frail elderly appearing gentleman on vent HENT: [place]/AT, PERRL, no JVD Lungs: Clear, bilateral breath sounds Cardiovascular: RRR, no MRG Abdomen: Soft, non-distended Extremities: No acute deformity Neuro: Sedated, moving RLE spontaneously  Resolved Hospital Problem list     Assessment & Plan:   ICH: Right frontal hemorrhage with volume of 136mL with 30mm midline shift.  - Management per neurology - Neurosurgery recommends conservative management and predicts poor prognosis.  - Cleviprex infusion for SBP goal < 140 mmHG - KCentra and Vit K given in ED  Acute respiratory failure: in setting of inability to protect airway - Full vent support - Send ABG - VAP bundle -  Wean as able - Ok to run PCO2 in to 30-35 range until other means of ICP reduction are decided on.  Mechanical aortic valve - Holding warfarin - Follow INR  Transaminitis - Follow LFT  Hyperglycemia without history of DM. - Follow without treatment    Best practice:  Diet: NPO Pain/Anxiety/Delirium protocol (if indicated): Propofol and fentanyl PRN for RASS -1 to -2. VAP protocol (if indicated): Per protocol DVT prophylaxis: None GI prophylaxis: PPI Glucose control: NA Mobility: BR Code Status: LIMITED:, no code Family Communication: per primary Disposition: ICU, prognosis poor  Labs   CBC: Recent Labs  Lab 02/01/2019 2239 01/21/2019 2303  WBC 10.8*  --   NEUTROABS 9.3*  --   HGB 11.9* 12.2*  HCT 36.2* 36.0*  MCV 90.3  --   PLT 194  --     Basic Metabolic Panel: Recent Labs  Lab 02/09/2019 2239 02/06/2019 2303  NA 137 136  K 3.9 3.9  CL 103 103  CO2 21*  --   GLUCOSE 145* 139*  BUN 23 24*  CREATININE 0.94 0.90  CALCIUM 9.2  --    GFR: Estimated Creatinine Clearance: 79.4 mL/min (by C-G formula based on SCr of 0.9 mg/dL). Recent Labs  Lab 01/22/2019 2239  WBC 10.8*    Liver Function Tests: Recent Labs  Lab 02/10/2019 2239  AST 65*  ALT 55*  ALKPHOS 87  BILITOT 1.0  PROT 6.6  ALBUMIN 3.8   No results for input(s): LIPASE, AMYLASE in the last 168 hours. No results for input(s): AMMONIA in the last 168 hours.  ABG    Component Value Date/Time   TCO2 24 02/09/2019 2303     Coagulation Profile: Recent Labs  Lab 01/28/2019 2239  INR 3.0*    Cardiac Enzymes: No results for input(s): CKTOTAL, CKMB, CKMBINDEX, TROPONINI in the last 168 hours.  HbA1C: Hemoglobin A1C  Date/Time Value Ref Range Status  08/10/2011 06:54 AM 5.1 4.2 - 6.3 % Final    Comment:    The American Diabetes Association recommends that a primary goal of therapy should be <7% and that physicians should reevaluate the treatment regimen in patients with HbA1c values consistently >8%.     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   Surgical History    Past Surgical History:  Procedure Laterality Date  . ABDOMINAL AORTIC ANEURYSM REPAIR  12/02/11   [facility]  . CARDIAC CATHETERIZATION    . CARDIAC VALVE REPLACEMENT  2001   aortic valve replaced with St Jude prosthetic valve  . CATARACT EXTRACTION W/PHACO Left 09/26/2015   Procedure: CATARACT EXTRACTION PHACO AND INTRAOCULAR LENS PLACEMENT (IOC);  Surgeon: Birder Robson, MD;  Location: [facility] ORS;  Service: Ophthalmology;  Laterality: Left;  [place] 30.0 AP% 23.4 CDE 7.08 Fluid pack lot # [id number]  . CATARACT EXTRACTION W/PHACO Right 10/10/2015   Procedure: CATARACT EXTRACTION PHACO AND INTRAOCULAR LENS PLACEMENT (IOC);  Surgeon: Gwyndolyn Saxon  Porfilio, MD;  Location: [facility] ORS;  Service: Ophthalmology;  Laterality: Right;  [place] 38.4 AP% 18.7 CDE 7.18 Fluid pack lot # [id number]  . VASCULAR SURGERY    . VOCAL CORD INJECTION  2015     Social History   reports that he has never smoked. He has never used smokeless tobacco. He reports current alcohol use. He reports that he does not use drugs.   Family History   His family history includes Goiter in his mother; Heart attack in his brother, brother, father, and sister; Hypertension in his father and sister; Suicidality in his son.   Allergies Allergies  Allergen Reactions  . Penicillins Hives and Nausea Only    Has patient had a PCN reaction causing immediate rash, facial/tongue/throat swelling, SOB  or lightheadedness with hypotension: Hives Has patient had a PCN reaction causing severe rash involving mucus membranes or skin necrosis: No Has patient had a PCN reaction that required hospitalization No Has patient had a PCN reaction occurring within the last 10 years: No If all of the above answers are "NO", then may proceed with Cephalosporin use.     Home Medications  Prior to Admission medications   Medication Sig Start Date End Date Taking? Authorizing Provider  amLODipine (NORVASC) 5 MG tablet TAKE ONE TABLET TWICE DAILY 11/08/18   Birdie Sons, MD  aspirin 81 MG tablet Take 81 mg by mouth daily.    [provider]  calcium carbonate 200 MG capsule Take 250 mg by mouth daily.     [provider]  carvedilol (COREG) 25 MG tablet TAKE ONE TABLET BY MOUTH EVERY 12 HOURS Patient taking differently: 12.5 mg.  05/14/18   Birdie Sons, MD  Coenzyme Q10 (COQ10) 100 MG CAPS Take 1 tablet by mouth daily.    [provider]  Cyanocobalamin (RA VITAMIN B-12 TR) 1000 MCG TBCR Take 1,000 mcg by mouth daily.    [provider]  HYDROcodone-homatropine (HYCODAN) 5-1.5 MG/5ML syrup Take 5 mLs by mouth every 8 (eight) hours as needed. 03/20/18   Birdie Sons, MD  pravastatin (PRAVACHOL) 80 MG tablet TAKE 1 TABLET BY MOUTH AT BEDTIME 10/06/18   Birdie Sons, MD  tadalafil (CIALIS) 20 MG tablet Take 0.5-1 tablets (10-20 mg total) by mouth every other day as needed for erectile dysfunction. 12/15/18   Birdie Sons, MD  temazepam (RESTORIL) 15 MG capsule TAKE 1 CAPSULE BY MOUTH AT BEDTIME AS NEEDED FOR SLEEP 10/20/18   Birdie Sons, MD  warfarin (COUMADIN) 10 MG tablet TAKE 1 TO 2 TABLETS AS DIRECTED 11/11/16   Birdie Sons, MD  warfarin (COUMADIN) 10 MG tablet TAKE 1 TO 2 TABLETS AS DIRECTED 01/07/17   Birdie Sons, MD  warfarin (COUMADIN) 10 MG tablet TAKE 1 TO 2 TABLETS BY MOUTH AS DIRECTED 09/16/18   Birdie Sons, MD     Critical care time:  40 mins     Georgann Housekeeper, AGACNP-BC [place] for personal pager PCCM on call pager [phone]  02/12/2019 1:09 AM

## 2019-02-13 ENCOUNTER — Inpatient Hospital Stay (HOSPITAL_COMMUNITY): Payer: Managed Care, Other (non HMO)

## 2019-02-13 DIAGNOSIS — G935 Compression of brain: Secondary | ICD-10-CM

## 2019-02-16 ENCOUNTER — Ambulatory Visit: Payer: Managed Care, Other (non HMO)

## 2019-02-16 NOTE — Progress Notes (Signed)
Time of death 6 verified by myself and Delfin Gant, RN

## 2019-02-16 NOTE — Progress Notes (Signed)
183ml of Morphine wasted in 4N21 sink during postmortem care with Karl Luke and Phebe Colla.

## 2019-02-16 DEATH — deceased

## 2019-03-19 NOTE — Discharge Summary (Signed)
Patient ID: Jason Mcdaniel MRN: [id number] DOB/AGE: [birth year] 74 y.o.  Admit date: 2019-03-02 Death date: 03-03-2019  Admission Diagnoses: Respiratory failure secondary to massive right parenchymal intracerebral hemorrhage with cytotoxic edema, brain herniation secondary to anticoagulation with warfarin. Patient made DNR and comfort care by family and ventilatory support withdrawn  Cause of Death:    Pertinent Medical Diagnosis: Active Problems:   Intracerebral hemorrhage, intraventricular (HCC)   Acute respiratory failure with hypoxia (HCC)   Cytotoxic brain edema (HCC)   Brain herniation [facility])   Hospital Course: [name] a 74 y.o.malewith h/o Aortic dissection, CAD, mechanical valve on warfarin presents with sudden onset worst headache of his life while having dinner at 8.30 pm. EMS called and patient unresponsive with left side weakness. BP 99991111 systolic.  On arrival, patient follows commands with right side. Left UE plegic and left leg moves but not against gravity. Forced gaze deviation to the right and dilated right pupil. Vomiting at scene. Stat CT head showed large left frontal bleed with 18 mm midline shift. Labetalol and clevepirix started immediately for BP control and patient intubated for airway. Kcentra given for reversal.INR was 3.0 NS consultedfor hematoma evacuation given significant midline shift.  According to the daughter,patient was complained of mild headache earlier in the evening. Had taken an Aleve. Later after dinner between 8:30 PM and 9 PM patient developed sudden onset severe headache and then she witnessed him have significant slurred speech,become slumped on the sofa and stopped using his left side. Called 911 immediately. ICH score2 (1 for GCS and 1 for size of hematoma). Patient remains comatose unresponsive and is intubated for respiratory failure and is on sedation.  His blood pressure is controlled on Cardene drip and he has been  started on hypertonic saline.  Repeat CT scan of the head next morning showed stable appearance of the large right parenchymal intracerebral hemorrhage with intraventricular extension and 19 mm right-to-left midline shift and transfalcine  brain herniation and cytotoxic edema and intraventricular extension. His INR was elevated which was reversed by giving Kcentra and blood pressure was tightly controlled on Cardene drip and he was started on hypertonic saline. After arrival of patient's family there was a long discussion about patient's poor prognosis and goals of care and family agreed to make him DNR and comfort care and withdrew ventilatory support and patient passed away shortly thereafter. Signed: Antony Contras 02/17/2019, 8:32 AM
